# Patient Record
Sex: Female | Born: 1945
Health system: Southern US, Community
[De-identification: ages and names within clinical notes are randomized; demographics above are authoritative.]

## PROBLEM LIST (undated history)

## (undated) DIAGNOSIS — J449 Chronic obstructive pulmonary disease, unspecified: Secondary | ICD-10-CM

## (undated) DIAGNOSIS — I10 Essential (primary) hypertension: Secondary | ICD-10-CM

## (undated) DIAGNOSIS — E785 Hyperlipidemia, unspecified: Secondary | ICD-10-CM

## (undated) DIAGNOSIS — E079 Disorder of thyroid, unspecified: Secondary | ICD-10-CM

## (undated) DIAGNOSIS — L719 Rosacea, unspecified: Secondary | ICD-10-CM

## (undated) DIAGNOSIS — H3530 Unspecified macular degeneration: Secondary | ICD-10-CM

## (undated) DIAGNOSIS — K635 Polyp of colon: Secondary | ICD-10-CM

## (undated) DIAGNOSIS — S42301A Unspecified fracture of shaft of humerus, right arm, initial encounter for closed fracture: Secondary | ICD-10-CM

## (undated) DIAGNOSIS — G4730 Sleep apnea, unspecified: Secondary | ICD-10-CM

## (undated) DIAGNOSIS — K219 Gastro-esophageal reflux disease without esophagitis: Secondary | ICD-10-CM

## (undated) DIAGNOSIS — F329 Major depressive disorder, single episode, unspecified: Secondary | ICD-10-CM

## (undated) DIAGNOSIS — K589 Irritable bowel syndrome without diarrhea: Secondary | ICD-10-CM

## (undated) DIAGNOSIS — I639 Cerebral infarction, unspecified: Secondary | ICD-10-CM

## (undated) DIAGNOSIS — H269 Unspecified cataract: Secondary | ICD-10-CM

## (undated) DIAGNOSIS — K5790 Diverticulosis of intestine, part unspecified, without perforation or abscess without bleeding: Secondary | ICD-10-CM

## (undated) DIAGNOSIS — F32A Depression, unspecified: Secondary | ICD-10-CM

## (undated) DIAGNOSIS — F419 Anxiety disorder, unspecified: Secondary | ICD-10-CM

## (undated) DIAGNOSIS — N3281 Overactive bladder: Secondary | ICD-10-CM

## (undated) HISTORY — DX: Polyp of colon: K63.5

## (undated) HISTORY — PX: OOPHORECTOMY: SHX86

## (undated) HISTORY — DX: Unspecified fracture of shaft of humerus, right arm, initial encounter for closed fracture: S42.301A

## (undated) HISTORY — DX: Depression, unspecified: F32.A

## (undated) HISTORY — PX: PARTIAL HYSTERECTOMY: SHX80

## (undated) HISTORY — DX: Cerebral infarction, unspecified: I63.9

## (undated) HISTORY — DX: Hyperlipidemia, unspecified: E78.5

## (undated) HISTORY — DX: Disorder of thyroid, unspecified: E07.9

## (undated) HISTORY — PX: CERVICAL FUSION: SHX112

## (undated) HISTORY — DX: Diverticulosis of intestine, part unspecified, without perforation or abscess without bleeding: K57.90

## (undated) HISTORY — DX: Rosacea, unspecified: L71.9

## (undated) HISTORY — DX: Anxiety disorder, unspecified: F41.9

## (undated) HISTORY — PX: TONSILLECTOMY: SUR1361

## (undated) HISTORY — DX: Major depressive disorder, single episode, unspecified: F32.9

## (undated) HISTORY — DX: Sleep apnea, unspecified: G47.30

## (undated) HISTORY — PX: HERNIA REPAIR: SHX51

## (undated) HISTORY — DX: Gastro-esophageal reflux disease without esophagitis: K21.9

## (undated) HISTORY — DX: Chronic obstructive pulmonary disease, unspecified: J44.9

## (undated) HISTORY — DX: Overactive bladder: N32.81

## (undated) HISTORY — DX: Essential (primary) hypertension: I10

## (undated) HISTORY — DX: Irritable bowel syndrome, unspecified: K58.9

## (undated) HISTORY — PX: BLADDER SURGERY: SHX569

## (undated) HISTORY — DX: Unspecified cataract: H26.9

## (undated) HISTORY — PX: SHOULDER SURGERY: SHX246

## (undated) HISTORY — DX: Unspecified macular degeneration: H35.30

---
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
## 1993-07-31 HISTORY — PX: HYSTERECTOMY ABDOMINAL WITH SALPINGO-OOPHORECTOMY: SHX6792

## 1998-07-15 ENCOUNTER — Other Ambulatory Visit: Admission: RE | Admit: 1998-07-15 | Discharge: 1998-07-15 | Payer: Self-pay | Admitting: Gynecology

## 1998-07-31 HISTORY — PX: CERVICAL FUSION: SHX112

## 1999-09-13 ENCOUNTER — Other Ambulatory Visit: Admission: RE | Admit: 1999-09-13 | Discharge: 1999-09-13 | Payer: Self-pay | Admitting: Gynecology

## 2000-09-25 ENCOUNTER — Other Ambulatory Visit: Admission: RE | Admit: 2000-09-25 | Discharge: 2000-09-25 | Payer: Self-pay | Admitting: Gynecology

## 2001-11-13 ENCOUNTER — Other Ambulatory Visit: Admission: RE | Admit: 2001-11-13 | Discharge: 2001-11-13 | Payer: Self-pay | Admitting: Gynecology

## 2002-11-17 ENCOUNTER — Other Ambulatory Visit: Admission: RE | Admit: 2002-11-17 | Discharge: 2002-11-17 | Payer: Self-pay | Admitting: Gynecology

## 2003-08-07 ENCOUNTER — Ambulatory Visit (HOSPITAL_BASED_OUTPATIENT_CLINIC_OR_DEPARTMENT_OTHER): Admission: RE | Admit: 2003-08-07 | Discharge: 2003-08-07 | Payer: Self-pay | Admitting: Pulmonary Disease

## 2003-08-08 ENCOUNTER — Encounter: Payer: Self-pay | Admitting: Pulmonary Disease

## 2003-10-09 ENCOUNTER — Encounter: Payer: Self-pay | Admitting: Pulmonary Disease

## 2003-10-09 ENCOUNTER — Ambulatory Visit (HOSPITAL_BASED_OUTPATIENT_CLINIC_OR_DEPARTMENT_OTHER): Admission: RE | Admit: 2003-10-09 | Discharge: 2003-10-09 | Payer: Self-pay | Admitting: Pulmonary Disease

## 2003-11-23 ENCOUNTER — Other Ambulatory Visit: Admission: RE | Admit: 2003-11-23 | Discharge: 2003-11-23 | Payer: Self-pay | Admitting: Gynecology

## 2004-09-22 ENCOUNTER — Ambulatory Visit: Payer: Self-pay | Admitting: Internal Medicine

## 2004-12-01 ENCOUNTER — Other Ambulatory Visit: Admission: RE | Admit: 2004-12-01 | Discharge: 2004-12-01 | Payer: Self-pay | Admitting: Gynecology

## 2005-02-17 ENCOUNTER — Ambulatory Visit (HOSPITAL_COMMUNITY): Admission: AD | Admit: 2005-02-17 | Discharge: 2005-02-20 | Payer: Self-pay | Admitting: General Surgery

## 2005-03-28 ENCOUNTER — Ambulatory Visit: Payer: Self-pay | Admitting: Internal Medicine

## 2005-09-15 ENCOUNTER — Ambulatory Visit: Payer: Self-pay | Admitting: Internal Medicine

## 2005-11-15 ENCOUNTER — Ambulatory Visit: Payer: Self-pay | Admitting: Internal Medicine

## 2005-12-06 ENCOUNTER — Encounter: Admission: RE | Admit: 2005-12-06 | Discharge: 2005-12-06 | Payer: Self-pay | Admitting: Gynecology

## 2005-12-06 ENCOUNTER — Other Ambulatory Visit: Admission: RE | Admit: 2005-12-06 | Discharge: 2005-12-06 | Payer: Self-pay | Admitting: Gynecology

## 2005-12-06 ENCOUNTER — Ambulatory Visit: Payer: Self-pay | Admitting: Internal Medicine

## 2005-12-08 ENCOUNTER — Ambulatory Visit: Payer: Self-pay | Admitting: Internal Medicine

## 2005-12-11 ENCOUNTER — Ambulatory Visit: Payer: Self-pay | Admitting: Internal Medicine

## 2005-12-15 ENCOUNTER — Ambulatory Visit: Payer: Self-pay | Admitting: Internal Medicine

## 2005-12-15 ENCOUNTER — Encounter (INDEPENDENT_AMBULATORY_CARE_PROVIDER_SITE_OTHER): Payer: Self-pay | Admitting: Specialist

## 2005-12-27 ENCOUNTER — Ambulatory Visit: Payer: Self-pay | Admitting: Pulmonary Disease

## 2006-01-26 ENCOUNTER — Encounter: Admission: RE | Admit: 2006-01-26 | Discharge: 2006-01-26 | Payer: Self-pay | Admitting: Gynecology

## 2006-05-30 ENCOUNTER — Ambulatory Visit: Payer: Self-pay | Admitting: Internal Medicine

## 2006-06-05 ENCOUNTER — Ambulatory Visit: Payer: Self-pay | Admitting: Internal Medicine

## 2006-06-10 ENCOUNTER — Emergency Department (HOSPITAL_COMMUNITY): Admission: EM | Admit: 2006-06-10 | Discharge: 2006-06-11 | Payer: Self-pay | Admitting: Emergency Medicine

## 2006-10-16 ENCOUNTER — Ambulatory Visit: Payer: Self-pay | Admitting: Internal Medicine

## 2006-12-19 ENCOUNTER — Encounter: Payer: Self-pay | Admitting: Internal Medicine

## 2007-01-07 ENCOUNTER — Telehealth (INDEPENDENT_AMBULATORY_CARE_PROVIDER_SITE_OTHER): Payer: Self-pay | Admitting: *Deleted

## 2007-01-08 ENCOUNTER — Ambulatory Visit: Payer: Self-pay | Admitting: Internal Medicine

## 2007-01-08 DIAGNOSIS — F32A Depression, unspecified: Secondary | ICD-10-CM | POA: Insufficient documentation

## 2007-01-08 DIAGNOSIS — J45909 Unspecified asthma, uncomplicated: Secondary | ICD-10-CM | POA: Insufficient documentation

## 2007-01-08 DIAGNOSIS — I10 Essential (primary) hypertension: Secondary | ICD-10-CM | POA: Insufficient documentation

## 2007-01-08 DIAGNOSIS — G4730 Sleep apnea, unspecified: Secondary | ICD-10-CM | POA: Insufficient documentation

## 2007-01-08 DIAGNOSIS — Z8719 Personal history of other diseases of the digestive system: Secondary | ICD-10-CM | POA: Insufficient documentation

## 2007-01-08 DIAGNOSIS — L719 Rosacea, unspecified: Secondary | ICD-10-CM | POA: Insufficient documentation

## 2007-01-08 DIAGNOSIS — F329 Major depressive disorder, single episode, unspecified: Secondary | ICD-10-CM

## 2007-01-08 DIAGNOSIS — E785 Hyperlipidemia, unspecified: Secondary | ICD-10-CM | POA: Insufficient documentation

## 2007-01-14 ENCOUNTER — Ambulatory Visit: Payer: Self-pay | Admitting: Internal Medicine

## 2007-01-14 ENCOUNTER — Telehealth: Payer: Self-pay | Admitting: Internal Medicine

## 2007-01-21 ENCOUNTER — Other Ambulatory Visit: Admission: RE | Admit: 2007-01-21 | Discharge: 2007-01-21 | Payer: Self-pay | Admitting: Gynecology

## 2007-01-22 ENCOUNTER — Ambulatory Visit: Payer: Self-pay | Admitting: Pulmonary Disease

## 2007-01-22 LAB — CONVERTED CEMR LAB
BUN: 10 mg/dL (ref 6–23)
Basophils Absolute: 0.1 10*3/uL (ref 0.0–0.1)
Basophils Relative: 2.1 % — ABNORMAL HIGH (ref 0.0–1.0)
Bilirubin, Direct: 0.1 mg/dL (ref 0.0–0.3)
Calcium: 9.5 mg/dL (ref 8.4–10.5)
Chloride: 108 meq/L (ref 96–112)
Direct LDL: 153.8 mg/dL
Eosinophils Absolute: 0.2 10*3/uL (ref 0.0–0.6)
Eosinophils Relative: 2.8 % (ref 0.0–5.0)
GFR calc non Af Amer: 108 mL/min
HCT: 44.3 % (ref 36.0–46.0)
HDL: 49.1 mg/dL (ref >39.0)
Lymphocytes Relative: 20.8 % (ref 12.0–46.0)
Monocytes Absolute: 0.3 10*3/uL (ref 0.2–0.7)
RBC: 4.7 M/uL (ref 3.87–5.11)
Total Bilirubin: 0.4 mg/dL (ref 0.3–1.2)
Total CHOL/HDL Ratio: 7.1
Total Protein: 6.2 g/dL (ref 6.0–8.3)
VLDL: 107 mg/dL — ABNORMAL HIGH (ref 0–40)

## 2007-01-23 ENCOUNTER — Ambulatory Visit (HOSPITAL_BASED_OUTPATIENT_CLINIC_OR_DEPARTMENT_OTHER): Admission: RE | Admit: 2007-01-23 | Discharge: 2007-01-23 | Payer: Self-pay | Admitting: Pulmonary Disease

## 2007-01-23 ENCOUNTER — Encounter: Payer: Self-pay | Admitting: Pulmonary Disease

## 2007-01-28 ENCOUNTER — Ambulatory Visit: Payer: Self-pay | Admitting: Pulmonary Disease

## 2007-02-28 ENCOUNTER — Ambulatory Visit: Payer: Self-pay | Admitting: Pulmonary Disease

## 2007-05-14 ENCOUNTER — Telehealth (INDEPENDENT_AMBULATORY_CARE_PROVIDER_SITE_OTHER): Payer: Self-pay | Admitting: *Deleted

## 2007-05-21 ENCOUNTER — Ambulatory Visit: Payer: Self-pay | Admitting: Internal Medicine

## 2007-05-21 DIAGNOSIS — F17200 Nicotine dependence, unspecified, uncomplicated: Secondary | ICD-10-CM | POA: Insufficient documentation

## 2007-05-28 ENCOUNTER — Telehealth (INDEPENDENT_AMBULATORY_CARE_PROVIDER_SITE_OTHER): Payer: Self-pay | Admitting: *Deleted

## 2007-09-27 ENCOUNTER — Ambulatory Visit: Payer: Self-pay | Admitting: Internal Medicine

## 2007-09-27 DIAGNOSIS — N318 Other neuromuscular dysfunction of bladder: Secondary | ICD-10-CM | POA: Insufficient documentation

## 2007-09-27 DIAGNOSIS — G8929 Other chronic pain: Secondary | ICD-10-CM | POA: Insufficient documentation

## 2007-09-27 DIAGNOSIS — M545 Low back pain, unspecified: Secondary | ICD-10-CM | POA: Insufficient documentation

## 2008-03-17 ENCOUNTER — Encounter: Payer: Self-pay | Admitting: Pulmonary Disease

## 2008-03-20 ENCOUNTER — Ambulatory Visit: Payer: Self-pay | Admitting: Internal Medicine

## 2008-03-20 ENCOUNTER — Ambulatory Visit: Payer: Self-pay | Admitting: Pulmonary Disease

## 2008-03-20 DIAGNOSIS — Z860100 Personal history of colon polyps, unspecified: Secondary | ICD-10-CM | POA: Insufficient documentation

## 2008-03-20 DIAGNOSIS — K589 Irritable bowel syndrome without diarrhea: Secondary | ICD-10-CM | POA: Insufficient documentation

## 2008-03-20 DIAGNOSIS — R197 Diarrhea, unspecified: Secondary | ICD-10-CM | POA: Insufficient documentation

## 2008-03-20 LAB — CONVERTED CEMR LAB
AST: 18 units/L (ref 0–37)
Alkaline Phosphatase: 80 units/L (ref 39–117)
BUN: 15 mg/dL (ref 6–23)
Creatinine, Ser: 0.7 mg/dL (ref 0.4–1.2)
Eosinophils Absolute: 0.1 10*3/uL (ref 0.0–0.7)
GFR calc Af Amer: 109 mL/min
Glucose, Bld: 98 mg/dL (ref 70–99)
Hemoglobin: 15.4 g/dL — ABNORMAL HIGH (ref 12.0–15.0)
Monocytes Absolute: 0.5 10*3/uL (ref 0.1–1.0)
Neutrophils Relative %: 66.3 % (ref 43.0–77.0)
Platelets: 212 10*3/uL (ref 150–400)
RBC: 4.68 M/uL (ref 3.87–5.11)
Total Bilirubin: 0.5 mg/dL (ref 0.3–1.2)
WBC: 6.4 10*3/uL (ref 4.5–10.5)

## 2008-03-21 ENCOUNTER — Encounter: Payer: Self-pay | Admitting: Internal Medicine

## 2008-04-16 ENCOUNTER — Ambulatory Visit: Payer: Self-pay | Admitting: Internal Medicine

## 2008-04-20 ENCOUNTER — Telehealth (INDEPENDENT_AMBULATORY_CARE_PROVIDER_SITE_OTHER): Payer: Self-pay | Admitting: *Deleted

## 2008-06-18 ENCOUNTER — Ambulatory Visit: Payer: Self-pay | Admitting: Internal Medicine

## 2008-06-18 DIAGNOSIS — R079 Chest pain, unspecified: Secondary | ICD-10-CM | POA: Insufficient documentation

## 2008-07-03 ENCOUNTER — Telehealth (INDEPENDENT_AMBULATORY_CARE_PROVIDER_SITE_OTHER): Payer: Self-pay | Admitting: *Deleted

## 2008-07-13 ENCOUNTER — Telehealth (INDEPENDENT_AMBULATORY_CARE_PROVIDER_SITE_OTHER): Payer: Self-pay | Admitting: *Deleted

## 2008-07-20 ENCOUNTER — Ambulatory Visit: Payer: Self-pay | Admitting: Family Medicine

## 2008-07-20 DIAGNOSIS — H6690 Otitis media, unspecified, unspecified ear: Secondary | ICD-10-CM | POA: Insufficient documentation

## 2008-07-22 ENCOUNTER — Telehealth (INDEPENDENT_AMBULATORY_CARE_PROVIDER_SITE_OTHER): Payer: Self-pay | Admitting: *Deleted

## 2008-07-23 ENCOUNTER — Telehealth (INDEPENDENT_AMBULATORY_CARE_PROVIDER_SITE_OTHER): Payer: Self-pay | Admitting: *Deleted

## 2008-07-31 HISTORY — PX: HERNIA REPAIR: SHX51

## 2009-02-12 ENCOUNTER — Telehealth (INDEPENDENT_AMBULATORY_CARE_PROVIDER_SITE_OTHER): Payer: Self-pay | Admitting: *Deleted

## 2009-02-17 ENCOUNTER — Telehealth (INDEPENDENT_AMBULATORY_CARE_PROVIDER_SITE_OTHER): Payer: Self-pay | Admitting: *Deleted

## 2009-02-24 ENCOUNTER — Telehealth (INDEPENDENT_AMBULATORY_CARE_PROVIDER_SITE_OTHER): Payer: Self-pay | Admitting: *Deleted

## 2009-03-19 ENCOUNTER — Ambulatory Visit: Payer: Self-pay | Admitting: Family Medicine

## 2009-03-19 DIAGNOSIS — K219 Gastro-esophageal reflux disease without esophagitis: Secondary | ICD-10-CM | POA: Insufficient documentation

## 2009-04-02 ENCOUNTER — Ambulatory Visit: Payer: Self-pay | Admitting: Family Medicine

## 2009-04-02 DIAGNOSIS — R5383 Other fatigue: Secondary | ICD-10-CM | POA: Insufficient documentation

## 2009-04-06 LAB — CONVERTED CEMR LAB
Albumin: 4.1 g/dL (ref 3.5–5.2)
Alkaline Phosphatase: 58 units/L (ref 39–117)
BUN: 17 mg/dL (ref 6–23)
Basophils Absolute: 0 10*3/uL (ref 0.0–0.1)
Bilirubin, Direct: 0 mg/dL (ref 0.0–0.3)
CO2: 31 meq/L (ref 19–32)
Creatinine, Ser: 0.7 mg/dL (ref 0.4–1.2)
Direct LDL: 126.1 mg/dL
Eosinophils Absolute: 0.2 10*3/uL (ref 0.0–0.7)
Folate: 9.2 ng/mL
Glucose, Bld: 84 mg/dL (ref 70–99)
Hemoglobin: 16.1 g/dL — ABNORMAL HIGH (ref 12.0–15.0)
Lymphocytes Relative: 20.2 % (ref 12.0–46.0)
MCHC: 34.7 g/dL (ref 30.0–36.0)
MCV: 97 fL (ref 78.0–100.0)
Monocytes Absolute: 0.6 10*3/uL (ref 0.1–1.0)
Neutro Abs: 5.3 10*3/uL (ref 1.4–7.7)
Neutrophils Relative %: 70.2 % (ref 43.0–77.0)
Potassium: 4.3 meq/L (ref 3.5–5.1)
RBC: 4.78 M/uL (ref 3.87–5.11)
TSH: 1.31 microintl units/mL (ref 0.35–5.50)
Total CHOL/HDL Ratio: 5
VLDL: 80.2 mg/dL — ABNORMAL HIGH (ref 0.0–40.0)
Vit D, 25-Hydroxy: 17 ng/mL — ABNORMAL LOW (ref 30–89)

## 2009-04-08 ENCOUNTER — Telehealth (INDEPENDENT_AMBULATORY_CARE_PROVIDER_SITE_OTHER): Payer: Self-pay | Admitting: *Deleted

## 2009-04-08 ENCOUNTER — Ambulatory Visit: Payer: Self-pay | Admitting: Family Medicine

## 2009-04-08 ENCOUNTER — Ambulatory Visit (HOSPITAL_BASED_OUTPATIENT_CLINIC_OR_DEPARTMENT_OTHER): Admission: RE | Admit: 2009-04-08 | Discharge: 2009-04-08 | Payer: Self-pay | Admitting: Critical Care Medicine

## 2009-04-08 ENCOUNTER — Ambulatory Visit: Payer: Self-pay | Admitting: Critical Care Medicine

## 2009-04-08 ENCOUNTER — Ambulatory Visit: Payer: Self-pay | Admitting: Diagnostic Radiology

## 2009-04-08 DIAGNOSIS — E538 Deficiency of other specified B group vitamins: Secondary | ICD-10-CM | POA: Insufficient documentation

## 2009-04-08 LAB — CONVERTED CEMR LAB: IgE (Immunoglobulin E), Serum: 13.4 intl units/mL (ref 0.0–180.0)

## 2009-04-15 ENCOUNTER — Ambulatory Visit: Payer: Self-pay | Admitting: Family Medicine

## 2009-04-21 ENCOUNTER — Encounter: Payer: Self-pay | Admitting: Cardiology

## 2009-04-21 ENCOUNTER — Ambulatory Visit: Payer: Self-pay | Admitting: Cardiology

## 2009-04-21 DIAGNOSIS — R0989 Other specified symptoms and signs involving the circulatory and respiratory systems: Secondary | ICD-10-CM | POA: Insufficient documentation

## 2009-04-22 ENCOUNTER — Ambulatory Visit: Payer: Self-pay | Admitting: Family Medicine

## 2009-04-23 ENCOUNTER — Encounter (INDEPENDENT_AMBULATORY_CARE_PROVIDER_SITE_OTHER): Payer: Self-pay | Admitting: *Deleted

## 2009-05-06 ENCOUNTER — Ambulatory Visit: Payer: Self-pay | Admitting: Critical Care Medicine

## 2009-05-19 ENCOUNTER — Ambulatory Visit: Payer: Self-pay | Admitting: Critical Care Medicine

## 2009-05-19 ENCOUNTER — Telehealth (INDEPENDENT_AMBULATORY_CARE_PROVIDER_SITE_OTHER): Payer: Self-pay | Admitting: *Deleted

## 2009-05-20 ENCOUNTER — Ambulatory Visit: Payer: Self-pay

## 2009-05-20 ENCOUNTER — Encounter: Payer: Self-pay | Admitting: Cardiology

## 2009-05-20 ENCOUNTER — Encounter: Payer: Self-pay | Admitting: Critical Care Medicine

## 2009-05-20 ENCOUNTER — Encounter (HOSPITAL_COMMUNITY): Admission: RE | Admit: 2009-05-20 | Discharge: 2009-07-28 | Payer: Self-pay | Admitting: Cardiology

## 2009-05-20 ENCOUNTER — Ambulatory Visit: Payer: Self-pay | Admitting: Cardiology

## 2009-05-27 ENCOUNTER — Telehealth: Payer: Self-pay | Admitting: Family Medicine

## 2009-05-27 ENCOUNTER — Ambulatory Visit: Payer: Self-pay | Admitting: Family Medicine

## 2009-06-02 ENCOUNTER — Encounter: Payer: Self-pay | Admitting: Cardiology

## 2009-06-02 ENCOUNTER — Ambulatory Visit: Payer: Self-pay | Admitting: Cardiology

## 2009-06-02 DIAGNOSIS — I6529 Occlusion and stenosis of unspecified carotid artery: Secondary | ICD-10-CM | POA: Insufficient documentation

## 2009-06-03 ENCOUNTER — Ambulatory Visit: Payer: Self-pay | Admitting: Critical Care Medicine

## 2009-06-03 DIAGNOSIS — J383 Other diseases of vocal cords: Secondary | ICD-10-CM | POA: Insufficient documentation

## 2009-06-04 ENCOUNTER — Telehealth (INDEPENDENT_AMBULATORY_CARE_PROVIDER_SITE_OTHER): Payer: Self-pay | Admitting: *Deleted

## 2009-06-07 ENCOUNTER — Telehealth: Payer: Self-pay | Admitting: Cardiology

## 2009-06-07 ENCOUNTER — Telehealth (INDEPENDENT_AMBULATORY_CARE_PROVIDER_SITE_OTHER): Payer: Self-pay | Admitting: *Deleted

## 2009-06-16 ENCOUNTER — Telehealth (INDEPENDENT_AMBULATORY_CARE_PROVIDER_SITE_OTHER): Payer: Self-pay | Admitting: *Deleted

## 2009-06-16 ENCOUNTER — Ambulatory Visit: Payer: Self-pay | Admitting: Internal Medicine

## 2009-06-17 LAB — CONVERTED CEMR LAB
LDL Cholesterol: 101 mg/dL — ABNORMAL HIGH (ref 0–99)
Total CHOL/HDL Ratio: 3
Vitamin B-12: >1500 pg/mL — ABNORMAL HIGH (ref 211–911)

## 2009-06-29 ENCOUNTER — Telehealth (INDEPENDENT_AMBULATORY_CARE_PROVIDER_SITE_OTHER): Payer: Self-pay | Admitting: *Deleted

## 2009-08-10 ENCOUNTER — Telehealth (INDEPENDENT_AMBULATORY_CARE_PROVIDER_SITE_OTHER): Payer: Self-pay | Admitting: *Deleted

## 2009-09-01 ENCOUNTER — Telehealth: Payer: Self-pay | Admitting: Internal Medicine

## 2009-10-01 ENCOUNTER — Telehealth (INDEPENDENT_AMBULATORY_CARE_PROVIDER_SITE_OTHER): Payer: Self-pay | Admitting: *Deleted

## 2009-10-21 ENCOUNTER — Telehealth (INDEPENDENT_AMBULATORY_CARE_PROVIDER_SITE_OTHER): Payer: Self-pay | Admitting: *Deleted

## 2009-11-12 ENCOUNTER — Telehealth (INDEPENDENT_AMBULATORY_CARE_PROVIDER_SITE_OTHER): Payer: Self-pay | Admitting: *Deleted

## 2009-11-12 ENCOUNTER — Encounter (INDEPENDENT_AMBULATORY_CARE_PROVIDER_SITE_OTHER): Payer: Self-pay | Admitting: *Deleted

## 2009-11-22 ENCOUNTER — Telehealth (INDEPENDENT_AMBULATORY_CARE_PROVIDER_SITE_OTHER): Payer: Self-pay | Admitting: *Deleted

## 2009-11-23 ENCOUNTER — Telehealth (INDEPENDENT_AMBULATORY_CARE_PROVIDER_SITE_OTHER): Payer: Self-pay | Admitting: *Deleted

## 2009-12-07 ENCOUNTER — Telehealth (INDEPENDENT_AMBULATORY_CARE_PROVIDER_SITE_OTHER): Payer: Self-pay | Admitting: *Deleted

## 2009-12-16 ENCOUNTER — Ambulatory Visit: Payer: Self-pay | Admitting: Family Medicine

## 2009-12-16 DIAGNOSIS — R413 Other amnesia: Secondary | ICD-10-CM | POA: Insufficient documentation

## 2009-12-16 LAB — CONVERTED CEMR LAB
ALT: 21 units/L (ref 0–35)
Albumin: 4.4 g/dL (ref 3.5–5.2)
Basophils Relative: 0.6 % (ref 0.0–3.0)
Bilirubin, Direct: 0.1 mg/dL (ref 0.0–0.3)
CO2: 28 meq/L (ref 19–32)
Chloride: 105 meq/L (ref 96–112)
Creatinine, Ser: 0.8 mg/dL (ref 0.4–1.2)
LDL Cholesterol: 83 mg/dL (ref 0–99)
Lymphocytes Relative: 25.9 % (ref 12.0–46.0)
Lymphs Abs: 1.5 10*3/uL (ref 0.7–4.0)
MCV: 97.9 fL (ref 78.0–100.0)
Monocytes Absolute: 0.5 10*3/uL (ref 0.1–1.0)
Monocytes Relative: 8.3 % (ref 3.0–12.0)
Neutro Abs: 3.6 10*3/uL (ref 1.4–7.7)
Neutrophils Relative %: 63 % (ref 43.0–77.0)
Platelets: 201 10*3/uL (ref 150.0–400.0)
RBC: 4.77 M/uL (ref 3.87–5.11)
RDW: 12.5 % (ref 11.5–14.6)
Sodium: 141 meq/L (ref 135–145)
Total Bilirubin: 0.5 mg/dL (ref 0.3–1.2)
Total CHOL/HDL Ratio: 3
Total Protein: 6.7 g/dL (ref 6.0–8.3)
Vitamin B-12: 235 pg/mL (ref 211–911)
WBC: 5.8 10*3/uL (ref 4.5–10.5)

## 2009-12-17 ENCOUNTER — Telehealth (INDEPENDENT_AMBULATORY_CARE_PROVIDER_SITE_OTHER): Payer: Self-pay | Admitting: *Deleted

## 2009-12-17 LAB — CONVERTED CEMR LAB
Free T4: 0.8 ng/dL (ref 0.6–1.6)
Vit D, 25-Hydroxy: 24 ng/mL — ABNORMAL LOW (ref 30–89)

## 2009-12-21 ENCOUNTER — Telehealth (INDEPENDENT_AMBULATORY_CARE_PROVIDER_SITE_OTHER): Payer: Self-pay | Admitting: *Deleted

## 2010-01-27 ENCOUNTER — Telehealth (INDEPENDENT_AMBULATORY_CARE_PROVIDER_SITE_OTHER): Payer: Self-pay | Admitting: *Deleted

## 2010-03-18 ENCOUNTER — Telehealth (INDEPENDENT_AMBULATORY_CARE_PROVIDER_SITE_OTHER): Payer: Self-pay | Admitting: *Deleted

## 2010-03-22 ENCOUNTER — Ambulatory Visit: Payer: Self-pay | Admitting: Family Medicine

## 2010-03-22 DIAGNOSIS — E559 Vitamin D deficiency, unspecified: Secondary | ICD-10-CM | POA: Insufficient documentation

## 2010-03-22 LAB — CONVERTED CEMR LAB
Basophils Absolute: 0 10*3/uL (ref 0.0–0.1)
Eosinophils Relative: 2 % (ref 0.0–5.0)
HCT: 45.1 % (ref 36.0–46.0)
Hemoglobin: 15.6 g/dL — ABNORMAL HIGH (ref 12.0–15.0)
Lymphocytes Relative: 26.1 % (ref 12.0–46.0)
MCHC: 34.7 g/dL (ref 30.0–36.0)
MCV: 95.8 fL (ref 78.0–100.0)
Monocytes Relative: 7.6 % (ref 3.0–12.0)
Neutro Abs: 3.8 10*3/uL (ref 1.4–7.7)
Vitamin B-12: 267 pg/mL (ref 211–911)

## 2010-04-22 ENCOUNTER — Telehealth (INDEPENDENT_AMBULATORY_CARE_PROVIDER_SITE_OTHER): Payer: Self-pay | Admitting: *Deleted

## 2010-05-30 ENCOUNTER — Telehealth (INDEPENDENT_AMBULATORY_CARE_PROVIDER_SITE_OTHER): Payer: Self-pay | Admitting: *Deleted

## 2010-08-05 ENCOUNTER — Telehealth (INDEPENDENT_AMBULATORY_CARE_PROVIDER_SITE_OTHER): Payer: Self-pay | Admitting: *Deleted

## 2010-08-21 ENCOUNTER — Encounter: Payer: Self-pay | Admitting: Gynecology

## 2010-08-25 ENCOUNTER — Encounter (INDEPENDENT_AMBULATORY_CARE_PROVIDER_SITE_OTHER): Payer: Self-pay | Admitting: *Deleted

## 2010-09-01 NOTE — Progress Notes (Signed)
Summary: refill  Phone Note Refill Request Message from:  Fax from Pharmacy on November 12, 2009 2:29 PM  Refills Requested: Medication #1:  LORTAB 5 5-500 MG TABS 1 by mouth q4hours as needed-NEEDS TO SCHEDULE OFFICE VISIT BEFORE NEXT REFILL [facility] [address] fax [phone]   Method Requested: Fax to Local Pharmacy Next Appointment Scheduled: no appt Initial call taken by: Barb Merino,  November 12, 2009 2:30 PM  Follow-up for Phone Call        needs to schedule office visit as directed before last refill .........Marland KitchenDoristine Devoid  November 12, 2009 4:34 PM

## 2010-09-01 NOTE — Progress Notes (Signed)
Summary: LORTAB REFILL   Phone Note Refill Request Message from:  Fax from Pharmacy on October 01, 2009 3:40 PM  Refills Requested: Medication #1:  LORTAB 5 5-500 MG TABS 1 by mouth [facility]4hours [facility] [phone]   Method Requested: Fax to Local Pharmacy Next Appointment Scheduled: no appt Initial call taken by: Barb Merino,  October 01, 2009 3:40 PM    New/Updated Medications: LORTAB 5 5-500 MG TABS (HYDROCODONE-ACETAMINOPHEN) 1 by mouth q4hours as needed-NEEDS TO SCHEDULE OFFICE VISIT BEFORE NEXT REFILL Prescriptions: LORTAB 5 5-500 MG TABS (HYDROCODONE-ACETAMINOPHEN) 1 by mouth q4hours as needed-NEEDS TO SCHEDULE OFFICE VISIT BEFORE NEXT REFILL  #50 x 0   Entered by:   Doristine Devoid   Authorized by:   Neena Rhymes MD   Signed by:   Doristine Devoid on 10/04/2009   Method used:   Printed then faxed to ...       [facility] [address] [phone]* (retail)       [address]       [place], [place]  [address]       Ph: [phone]       Fax: [phone]   RxID:   [id number]

## 2010-09-01 NOTE — Assessment & Plan Note (Signed)
Summary: FATIGUE,MEDICARE/RH......   Vital Signs:  Patient profile:   65 year old female Weight:      209 pounds Pulse rate:   64 / minute BP sitting:   120 / 80  (left arm)  Vitals Entered By: Doristine Devoid CMA (March 22, 2010 9:30 AM) CC: fatigue and trouble sleeping    History of Present Illness: 65 yo woman here today for fatigue and insomnia.  husband had an MI 2.5 weeks ago.  attempting to quit smoking given recent events.  very stressed about near loss of husband and now lifestyle overhaul.  has sleep apnea- using machine nightly.  sxs of fatigue predate recent stressors.  due to recheck Vit D/B12.  waking up at 7, feels need to lie down at 11am and again at 3pm.  denies sxs of depression- has been depressed before and is aware of sxs.  not crying, withdrawing from family/friends, having overwhelming sadness.  denies abd pain, change in bowels, CP, HA.  Preventive Screening-Counseling & Management  Alcohol-Tobacco     Smoking Status: current     Smoking Cessation Counseling: yes     Smoke Cessation Stage: ready      Sexual History:  currently monogamous.    Current Medications (verified): 1)  Klonopin 0.5 Mg Tabs (Clonazepam) .... Take 1 1/2 Tablet By Mouth At Bedtime, 2)  Lortab 5 5-500 Mg Tabs (Hydrocodone-Acetaminophen) .Marland Kitchen.. 1 By Mouth Q4hours As Needed- 3)  Oxybutynin Chloride 5 Mg Tabs (Oxybutynin Chloride) .... 1/2 Tab Two Times A Day 4)  Pravachol 40 Mg Tabs (Pravastatin Sodium) .Marland Kitchen.. 1 A Day 5)  Albuterol Sulfate (5 Mg/ml) 0.5% Nebu (Albuterol Sulfate) .Marland Kitchen.. 1 Every 4-6 Hours As Needed.  Disp 1 Box. 6)  Omeprazole 40 Mg Cpdr (Omeprazole) .Marland Kitchen.. 1 Tab By Mouth Daily 7)  Vitamin B Injection .... Once A Month 8)  Tessalon Perles 100 Mg  Caps (Benzonatate) .... Two Every 4-6 Hours 9)  Celexa 20 Mg Tabs (Citalopram Hydrobromide) .Marland Kitchen.. 1 By Mouth Once Daily. 10)  Spiriva Handihaler 18 Mcg  Caps (Tiotropium Bromide Monohydrate) .... Contents of One Capsule Inhaled Daily 11)   Ergocalciferol 50000 Unit Caps (Ergocalciferol) .Marland Kitchen.. 1 By Mouth Weekly For 12 Weeks Then Recheck Lab  Allergies (verified): 1)  ! Bactrim 2)  ! Tramadol Hcl  Past History:  Past Medical History: Last updated: 12/16/2009 Current Problems:  HYPERTENSION (ICD-401.9) HYPERLIPIDEMIA (ICD-272.4) GERD (ICD-530.81) UNSPECIFIED OTITIS MEDIA (ICD-382.9) PERSONAL HX COLONIC POLYPS (ICD-V12.72) IRRITABLE BOWEL SYNDROME (ICD-564.1) DIARRHEA (ICD-787.91) OVERACTIVE BLADDER (ICD-596.51) DEPRESSION (ICD-311) COPD (ICD-496) ASTHMA (ICD-493.90) SLEEP APNEA (ICD-780.57) TOBACCO ABUSE (ICD-305.1) ROSACEA (ICD-695.3) OAB Diverticulosis Hemorrhoids Macular degeneration- Dr Jackquline Bosch  Social History: Last updated: 04/08/2009 Married two children husband [name] on disability Occupation: housekeeper, 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  now on disability Patient currently smokes 3 cig/day.  1ppd x35yrs.  Alcohol Use - no  Review of Systems      See HPI  Physical Exam  General:  alert, well-developed, and overweight-appearing.   Head:  Normocephalic and atraumatic.   Neck:  Supple; no masses or thyromegaly. Lungs:  decreased breath sounds throughout w/ expiratory wheezes Heart:  Regular rate and rhythm; no murmurs, rubs,  or bruits. Pulses:  +2 carotid, radial, DP Extremities:  no C/C/E Cervical Nodes:  No lymphadenopathy noted Psych:  Cognition and judgment appear intact. Alert and cooperative with normal attention span and concentration. No apparent delusions, illusions, hallucinations   Impression & Recommendations:  Problem # 1:  FATIGUE (ICD-780.79) Assessment Deteriorated check  labs.  pt's sxs likely multifactorial- poor oxygenation due to COPD, recent stress, overweight, possible lab abnormality (Vit D/B12).  check labs.  encouraged her to have her CPAP machine serviced as she has concerns about this.  will follow closely. Orders: Venipuncture ([address]) Specimen Handling ([address]) TLB-CBC  Platelet - w/Differential (85025-CBCD) TLB-TSH (Thyroid Stimulating Hormone) (84443-TSH)  Problem # 2:  B12 DEFICIENCY (ICD-266.2) Assessment: Unchanged due for labs Orders: Specimen Handling ([address]) TLB-B12, Serum-Total ONLY (91478-G95)  Problem # 3:  VITAMIN D DEFICIENCY (ICD-268.9) Assessment: New just completed course of replacement tx.  due for lab recheck Orders: T-Vitamin D (25-Hydroxy) (62130-86578)  Problem # 4:  DEPRESSION (ICD-311) Assessment: Unchanged pt reports this is controlled on current meds.  will follow. Her updated medication list for this problem includes:    Klonopin 0.5 Mg Tabs (Clonazepam) .Marland Kitchen... Take 1 1/2 tablet by mouth at bedtime,    Celexa 20 Mg Tabs (Citalopram hydrobromide) .Marland Kitchen... 1 by mouth once daily.  Complete Medication List: 1)  Klonopin 0.5 Mg Tabs (Clonazepam) .... Take 1 1/2 tablet by mouth at bedtime, 2)  Lortab 5 5-500 Mg Tabs (Hydrocodone-acetaminophen) .Marland Kitchen.. 1 by mouth q4hours as needed- 3)  Oxybutynin Chloride 5 Mg Tabs (Oxybutynin chloride) .... 1/2 tab two times a day 4)  Pravachol 40 Mg Tabs (Pravastatin sodium) .Marland Kitchen.. 1 a day 5)  Albuterol Sulfate (5 Mg/ml) 0.5% Nebu (Albuterol sulfate) .Marland Kitchen.. 1 every 4-6 hours as needed.  disp 1 box. 6)  Omeprazole 40 Mg Cpdr (Omeprazole) .Marland Kitchen.. 1 tab by mouth daily 7)  Vitamin B Injection  .... Once a month 8)  Tessalon Perles 100 Mg Caps (Benzonatate) .... Two every 4-6 hours 9)  Celexa 20 Mg Tabs (Citalopram hydrobromide) .Marland Kitchen.. 1 by mouth once daily. 10)  Spiriva Handihaler 18 Mcg Caps (Tiotropium bromide monohydrate) .... Contents of one capsule inhaled daily 11)  Ergocalciferol 50000 Unit Caps (Ergocalciferol) .Marland Kitchen.. 1 by mouth weekly for 12 weeks then recheck lab  Patient Instructions: 1)  Follow up in 2-3 weeks to recheck your symptoms if no better 2)  We'll notify you of your lab results 3)  Call and have them check your CPAP machine 4)  Call when you need refills/samples- we'll give you whatever  we have 5)  Hang in there!!

## 2010-09-01 NOTE — Progress Notes (Signed)
  Phone Note Refill Request Message from:  Fax from Pharmacy on September 01, 2009 2:57 PM  Refills Requested: Medication #1:  KLONOPIN 0.5 MG TABS Take 1 1/2 tablet by mouth at bedtime   Notes: was rx'd #180 with 1 refill on 01/04/09  [facility] [address] Initial call taken by: Shary Decamp,  September 01, 2009 2:57 PM  Follow-up for Phone Call        Patient got #180 last time which was a 4 month supply.  So she is due for refill this month.  I refilled #135 which is a 3 month supply until Dr. Beverely Low returns to office.  Note on rx that pt needs to schedule ov with Dr. Beverely Low before next refill.  Last ov with Tabori 03/2009 Shary Decamp  September 01, 2009 4:22 PM Anthony E. Shevette Bess MD  September 01, 2009 5:05 PM     New/Updated Medications: KLONOPIN 0.5 MG TABS (CLONAZEPAM) Take 1 1/2 tablet by mouth at bedtime, NEEDS OFFICE VISIT BEFORE NEXT REFILL Prescriptions: KLONOPIN 0.5 MG TABS (CLONAZEPAM) Take 1 1/2 tablet by mouth at bedtime, NEEDS OFFICE VISIT BEFORE NEXT REFILL  #135 x 0   Entered by:   Shary Decamp   Authorized by:   Nolon Rod. Dyneisha Murchison MD   Signed by:   Shary Decamp on 09/01/2009   Method used:   Printed then faxed to ...       [facility] [address] [phone]* (retail)       [address]       [place], [place]  [address]       Ph: [phone]       Fax: [phone]   RxID:   [id number]

## 2010-09-01 NOTE — Progress Notes (Signed)
Summary: oxyburtin and lortab refill   Phone Note Refill Request   Refills Requested: Medication #1:  LORTAB 5 5-500 MG TABS 1 by mouth q4hours as needed-  Medication #2:  OXYBUTYNIN CHLORIDE 5 MG TABS 1/2 tab two times a day Initial call taken by: Doristine Devoid CMA,  March 18, 2010 11:16 AM    Prescriptions: OXYBUTYNIN CHLORIDE 5 MG TABS (OXYBUTYNIN CHLORIDE) 1/2 tab two times a day  #30 x 3   Entered by:   Doristine Devoid CMA   Authorized by:   Neena Rhymes MD   Signed by:   Doristine Devoid CMA on 03/18/2010   Method used:   Printed then faxed to ...       [facility] [address] [phone]* (retail)       [address]       [place], [place]  [address]       Ph: [phone]       Fax: [phone]   RxID:   [id number] LORTAB 5 5-500 MG TABS (HYDROCODONE-ACETAMINOPHEN) 1 by mouth q4hours as needed-  #30 x 0   Entered by:   Doristine Devoid CMA   Authorized by:   Neena Rhymes MD   Signed by:   Doristine Devoid CMA on 03/18/2010   Method used:   Printed then faxed to ...       [facility] [address] [phone]* (retail)       [address]       [place], [place]  [address]       Ph: [phone]       Fax: [phone]   RxID:   [id number]

## 2010-09-01 NOTE — Progress Notes (Signed)
Summary: lortab refill   Phone Note Refill Request Call back at [phone] Message from:  Pharmacy on January 27, 2010 10:57 AM  Refills Requested: Medication #1:  LORTAB 5 5-500 MG TABS 1 by mouth q4hours as needed-   Dosage confirmed as above?Dosage Confirmed   Supply Requested: 1 month   Last Refilled: 12/24/2009 [facility] [address]  Next Appointment Scheduled: NONE Initial call taken by: Lavell Islam,  January 27, 2010 10:58 AM    Prescriptions: LORTAB 5 5-500 MG TABS (HYDROCODONE-ACETAMINOPHEN) 1 by mouth q4hours as needed-  #30 x 0   Entered by:   Doristine Devoid   Authorized by:   Neena Rhymes MD   Signed by:   Doristine Devoid on 01/27/2010   Method used:   Printed then faxed to ...       [facility] [address] [phone]* (retail)       [address]       [place], [place]  [address]       Ph: [phone]       Fax: [phone]   RxID:   [id number]

## 2010-09-01 NOTE — Progress Notes (Signed)
Summary: RX  Phone Note Refill Request Call back at [phone]   Refills Requested: Medication #1:  OXYBUTYNIN CHLORIDE 5 MG TABS 1/2 tab two times a day [facility]  [address]--[phone]  Initial call taken by: Freddy Jaksch,  August 05, 2010 3:32 PM    Prescriptions: OXYBUTYNIN CHLORIDE 5 MG TABS (OXYBUTYNIN CHLORIDE) 1/2 tab two times a day  #30 x 4   Entered by:   Doristine Devoid CMA   Authorized by:   Neena Rhymes MD   Signed by:   Doristine Devoid CMA on 08/05/2010   Method used:   Electronically to        [facility] [address] [phone]* (retail)       [address]       [place], [place]  [address]       Ph: [phone]       Fax: [phone]   RxID:   [id number]

## 2010-09-01 NOTE — Progress Notes (Signed)
Summary: pravachol refill   Phone Note Refill Request Message from:  Fax from Pharmacy on [address] fax [phone]  Refills Requested: Medication #1:  PRAVACHOL 40 MG TABS 1 a day Initial call taken by: Barb Merino,  August 10, 2009 11:17 AM    Prescriptions: PRAVACHOL 40 MG TABS (PRAVASTATIN SODIUM) 1 a day  #30 x 3   Entered by:   Doristine Devoid   Authorized by:   Neena Rhymes MD   Signed by:   Doristine Devoid on 08/10/2009   Method used:   Electronically to        [facility] [address] [phone]* (retail)       [address]       [place], [place]  [address]       Ph: [phone]       Fax: [phone]   RxID:   [id number]

## 2010-09-01 NOTE — Progress Notes (Signed)
Summary: oxybutynin refill   Phone Note Refill Request Message from:  Fax from Pharmacy on Dec 07, 2009 11:58 AM  Refills Requested: Medication #1:  OXYBUTYNIN CHLORIDE 5 MG TABS 1/2 tab two times a day [facility] [address] [facility] - fax [phone]   Method Requested: Fax to Local Pharmacy Initial call taken by: Okey Regal Spring,  Dec 07, 2009 11:59 AM    Prescriptions: OXYBUTYNIN CHLORIDE 5 MG TABS (OXYBUTYNIN CHLORIDE) 1/2 tab two times a day  #30 x 2   Entered by:   Doristine Devoid   Authorized by:   Neena Rhymes MD   Signed by:   Doristine Devoid on 12/07/2009   Method used:   Electronically to        [facility] [address] [phone]* (retail)       [address]       [place], [place]  [address]       Ph: [phone]       Fax: [phone]   RxID:   [id number]

## 2010-09-01 NOTE — Progress Notes (Signed)
Summary: lortab-denied needs appt  Phone Note Refill Request Call back at [phone] Message from:  Pharmacy on November 22, 2009 10:51 AM  Refills Requested: Medication #1:  LORTAB 5 5-500 MG TABS 1 by mouth q4hours as needed-NEEDS TO SCHEDULE OFFICE VISIT BEFORE NEXT REFILL   Dosage confirmed as above?Dosage Confirmed   Supply Requested: 1 month   Last Refilled: 10/04/2009 [facility] [address]  Next Appointment Scheduled: none Initial call taken by: Harold Barban,  November 22, 2009 10:51 AM  Follow-up for Phone Call        denied needs office visit also mailed letter to schedule appt no appt scheduled ...Marland KitchenMarland KitchenDoristine Devoid  November 22, 2009 2:23 PM

## 2010-09-01 NOTE — Progress Notes (Signed)
Summary: lortab and pravachol refill   Phone Note Refill Request Message from:  Patient on May 30, 2010 10:25 AM  Refills Requested: Medication #1:  PRAVACHOL 40 MG TABS 1 a day  Medication #2:  LORTAB 5 5-500 MG TABS 1 by mouth q4hours as needed- [facility] [address] tel [phone]   ------ lortab was auth by dr Beverely Low [phone] but wasnt sent  Initial call taken by: Okey Regal Spring,  May 30, 2010 10:27 AM    Prescriptions: PRAVACHOL 40 MG TABS (PRAVASTATIN SODIUM) 1 a day  #30 x 3   Entered by:   Doristine Devoid CMA   Authorized by:   Neena Rhymes MD   Signed by:   Doristine Devoid CMA on 05/30/2010   Method used:   Printed then faxed to ...       [facility] [address] [phone]* (retail)       [address]       [place], [place]  [address]       Ph: [phone]       Fax: [phone]   RxID:   [id number] LORTAB 5 5-500 MG TABS (HYDROCODONE-ACETAMINOPHEN) 1 by mouth q4hours as needed-  #30 x 0   Entered by:   Doristine Devoid CMA   Authorized by:   Neena Rhymes MD   Signed by:   Doristine Devoid CMA on 05/30/2010   Method used:   Printed then faxed to ...       [facility] [address] [phone]* (retail)       [address]       [place], [place]  [address]       Ph: [phone]       Fax: [phone]   RxID:   [id number]

## 2010-09-01 NOTE — Miscellaneous (Signed)
  Clinical Lists Changes  Medications: Rx of CELEXA 20 MG TABS (CITALOPRAM HYDROBROMIDE) 1 by mouth once daily.;  #30 x 5;  Signed;  Entered by: Doristine Devoid CMA;  Authorized by: Neena Rhymes MD;  Method used: Electronically to [facility] [phone]*, [address] [place], [place]  [address], Ph: [phone], Fax: [phone]    Prescriptions: CELEXA 20 MG TABS (CITALOPRAM HYDROBROMIDE) 1 by mouth once daily.  #30 x 5   Entered by:   Doristine Devoid CMA   Authorized by:   Neena Rhymes MD   Signed by:   Doristine Devoid CMA on 08/25/2010   Method used:   Electronically to        [facility] [address] [phone]* (retail)       [address]       [place], [place]  [address]       Ph: [phone]       Fax: [phone]   RxID:   [id number]

## 2010-09-01 NOTE — Letter (Signed)
Summary: Primary Care Appointment Letter  [facility]  [address] [place], [place] [address]   Phone: [phone]  Fax: [phone]    11/12/2009 MRN: [id number]  [place] [place] [address] [place], [place]  [address]  Dear Ms. Tomasa Rand,   Your Primary Care Physician Neena Rhymes MD has indicated that:    ___X____it is time to schedule an appointment for BP AND CHOLESTEROL follow up.    _______you missed your appointment on______ and need to call and          reschedule.    _______you need to have lab work done.    _______you need to schedule an appointment discuss lab or test results.    _______you need to call to reschedule your appointment that is                       scheduled on _________.     Please call our office as soon as possible. Our phone number is [phone]____.Our office is open 8a-5p, Monday through Friday.     Thank you,    [facility]

## 2010-09-01 NOTE — Progress Notes (Signed)
Summary: left msg for pt to call re lab vit d  Phone Note Outgoing Call   Call placed by: Kandice Hams,  Dec 17, 2009 10:40 AM Call placed to: Patient Summary of Call: left msg for pt to call re vit D lab .Kandice Hams  Dec 17, 2009 10:40 AM level is low- needs 50,000 units weekly x 12 weeks and then recheck   Follow-up for Phone Call        Spoke with pt informed of lab wants rx sent to [facility] [address]  .Kandice Hams  Dec 17, 2009 11:13 AM  Follow-up by: Kandice Hams,  Dec 17, 2009 11:13 AM    New/Updated Medications: ERGOCALCIFEROL 50000 UNIT CAPS (ERGOCALCIFEROL) 1 by mouth weekly for 12 weeks then recheck lab Prescriptions: ERGOCALCIFEROL 50000 UNIT CAPS (ERGOCALCIFEROL) 1 by mouth weekly for 12 weeks then recheck lab  #12 x 0   Entered by:   Kandice Hams   Authorized by:   Neena Rhymes MD   Signed by:   Kandice Hams on 12/17/2009   Method used:   Faxed to ...       [facility] [address] [phone]* (retail)       [address]       [place], [place]  [address]       Ph: [phone]       Fax: [phone]   RxID:   [id number]

## 2010-09-01 NOTE — Progress Notes (Signed)
Summary: refill   Phone Note Refill Request Message from:  Patient  Refills Requested: Medication #1:  LORTAB 5 5-500 MG TABS 1 by mouth q4hours as needed- [facility] [address]  Initial call taken by: Jeremy Johann CMA,  April 22, 2010 2:39 PM  Follow-up for Phone Call        last filled 03-18-10 #30,  last OV 03-22-10..............Marland KitchenFelecia Deloach CMA  April 22, 2010 2:41 PM   Additional Follow-up for Phone Call Additional follow up Details #1::        refill x1 Additional Follow-up by: Loreen Freud DO,  April 22, 2010 3:03 PM    Prescriptions: LORTAB 5 5-500 MG TABS (HYDROCODONE-ACETAMINOPHEN) 1 by mouth q4hours as needed-  #30 x 0   Entered by:   Jeremy Johann CMA   Authorized by:   Loreen Freud DO   Signed by:   Jeremy Johann CMA on 04/22/2010   Method used:   Printed then faxed to ...       [facility] [address] [phone]* (retail)       [address]       [place], [place]  [address]       Ph: [phone]       Fax: [phone]   RxID:   [id number]

## 2010-09-01 NOTE — Progress Notes (Signed)
Summary: refill  Phone Note Refill Request Message from:  Fax from Pharmacy on October 21, 2009 10:29 AM  Refills Requested: Medication #1:  OXYBUTYNIN CHLORIDE 5 MG TABS 1/2 tab two times a day [facility] [address] fax [phone]   Method Requested: Fax to Local Pharmacy Next Appointment Scheduled: no appt Initial call taken by: Barb Merino,  October 21, 2009 10:30 AM    Prescriptions: OXYBUTYNIN CHLORIDE 5 MG TABS (OXYBUTYNIN CHLORIDE) 1/2 tab two times a day  #30 x 0   Entered by:   Doristine Devoid   Authorized by:   Neena Rhymes MD   Signed by:   Doristine Devoid on 10/21/2009   Method used:   Electronically to        [facility] [address] [phone]* (retail)       [address]       [place], [place]  [address]       Ph: [phone]       Fax: [phone]   RxID:   [id number]

## 2010-09-01 NOTE — Progress Notes (Signed)
Summary: lortab-denied done5/19/11  Phone Note Refill Request Call back at [phone] Message from:  Pharmacy on Dec 21, 2009 8:54 AM  Refills Requested: Medication #1:  LORTAB 5 5-500 MG TABS 1 by mouth q4hours as needed-   Dosage confirmed as above?Dosage Confirmed   Supply Requested: 1 month   Last Refilled: 11/23/2009 [facility] [address] in [place]  Next Appointment Scheduled: none Initial call taken by: Harold Barban,  Dec 21, 2009 8:54 AM  Follow-up for Phone Call        done 12/16/09 at patient office visit .........Marland KitchenDoristine Devoid  Dec 21, 2009 9:27 AM

## 2010-09-01 NOTE — Progress Notes (Signed)
Summary: lortab refill   Phone Note Refill Request Message from:  Patient on November 23, 2009 4:27 PM  Refills Requested: Medication #1:  LORTAB 5 5-500 MG TABS 1 by mouth q4hours as needed-NEEDS TO SCHEDULE OFFICE VISIT BEFORE NEXT REFILL Initial call taken by: Doristine Devoid,  November 23, 2009 4:27 PM  Follow-up for Phone Call        has pending appt 12/16/09.Marland KitchenMarland KitchenDoristine Devoid  November 23, 2009 4:30 PM     New/Updated Medications: LORTAB 5 5-500 MG TABS (HYDROCODONE-ACETAMINOPHEN) 1 by mouth q4hours as needed- Prescriptions: LORTAB 5 5-500 MG TABS (HYDROCODONE-ACETAMINOPHEN) 1 by mouth q4hours as needed-  #30 x 0   Entered by:   Doristine Devoid   Authorized by:   Neena Rhymes MD   Signed by:   Doristine Devoid on 11/23/2009   Method used:   Printed then faxed to ...       [facility] [address] [phone]* (retail)       [address]       [place], [place]  [address]       Ph: [phone]       Fax: [phone]   RxID:   [id number]

## 2010-09-01 NOTE — Assessment & Plan Note (Signed)
Summary: CPX--PH   Vital Signs:  Patient profile:   65 year old female Height:      65 inches Weight:      208 pounds BMI:     34.74 Pulse rate:   68 / minute BP sitting:   128 / 72  (left arm)  Vitals Entered By: Doristine Devoid (Dec 16, 2009 8:14 AM) CC: CPX AND LAB   History of Present Illness: 65 yo woman here today for yearly.  GYN- Dr Nicholas Lose, UTD on pap/mammogram.  colonoscopy 3-4 yrs ago.  'total fog'- pt reports there will be a day every 1-2 months where she's in 'a total fog'.  feels mentally slow.  difficulty with directions, simple tasks.  'it's like i'm there but i'm not'.  forgetful.  pt reports episodes are happening more frequently.  HTN- BP adequately controlled and pt reports she is not taking Losartan.  denies CP, SOB, N/V, HAs  Hyperlipidemia- due for labs.  tolerating statin and fenofibrate.  no myalgias, N/V, abd pain  COPD- not taking inhalers as directed, 'i can't afford'.  follows w/ Dr Delford Field.   Preventive Screening-Counseling & Management  Alcohol-Tobacco     Smoking Status: current     Smoking Cessation Counseling: yes     Smoke Cessation Stage: contemplative     Packs/Day: 0.5  Caffeine-Diet-Exercise     Does Patient Exercise: yes     Type of exercise: walking     Times/week: 6      Sexual History:  currently monogamous.        Drug Use:  never.    Problems Prior to Update: 1)  Memory Loss  (ICD-780.93) 2)  Vocal Cord Disorder  (ICD-478.5) 3)  Carotid Stenosis  (ICD-433.10) 4)  Carotid Bruit  (ICD-785.9) 5)  B12 Deficiency  (ICD-266.2) 6)  Chest Pain  (ICD-786.50) 7)  Hypertension  (ICD-401.9) 8)  Hyperlipidemia  (ICD-272.4) 9)  Fatigue  (ICD-780.79) 10)  Gerd  (ICD-530.81) 11)  Chronic Obstructive Pulmonary Disease, Acute Exacerbation  (ICD-491.21) 12)  Unspecified Otitis Media  (ICD-382.9) 13)  Personal Hx Colonic Polyps  (ICD-V12.72) 14)  Irritable Bowel Syndrome  (ICD-564.1) 15)  Diarrhea  (ICD-787.91) 16)  Strep Throat   (icd-034.0) 17)  Back Pain  (ICD-724.5) 18)  Overactive Bladder  (ICD-596.51) 19)  Depression  (ICD-311) 20)  COPD  (ICD-496) 21)  Asthma  (ICD-493.90) 22)  Sleep Apnea  (ICD-780.57) 23)  Tobacco Abuse  (ICD-305.1) 24)  Rosacea  (ICD-695.3) 25)  Colonoscopy, Hx of  (ICD-V12.79)  Current Medications (verified): 1)  Klonopin 0.5 Mg Tabs (Clonazepam) .... Take 1 1/2 Tablet By Mouth At Bedtime, Needs Office Visit Before Next Refill 2)  Lortab 5 5-500 Mg Tabs (Hydrocodone-Acetaminophen) .Marland Kitchen.. 1 By Mouth Q4hours As Needed- 3)  Oxybutynin Chloride 5 Mg Tabs (Oxybutynin Chloride) .... 1/2 Tab Two Times A Day 4)  Pravachol 40 Mg Tabs (Pravastatin Sodium) .Marland Kitchen.. 1 A Day 5)  Albuterol Sulfate (5 Mg/ml) 0.5% Nebu (Albuterol Sulfate) .Marland Kitchen.. 1 Every 4-6 Hours As Needed.  Disp 1 Box. 6)  Omeprazole 40 Mg Cpdr (Omeprazole) .Marland Kitchen.. 1 Tab By Mouth Daily 7)  Fenofibrate 160 Mg Tabs (Fenofibrate) .... Take One Tablet Daily 8)  Hyomax-Sr 0.375 Mg Xr12h-Tab (Hyoscyamine Sulfate) .... Take 1 Tablet By Mouth Two Times A Day 9)  Vitamin B Injection .... Once A Month 10)  Tessalon Perles 100 Mg  Caps (Benzonatate) .... Two Every 4-6 Hours 11)  Celexa 20 Mg Tabs (Citalopram Hydrobromide) .Marland Kitchen.. 1 By Mouth  Once Daily. 12)  Tramadol Hcl 50 Mg  Tabs (Tramadol Hcl) .... One To Two By Mouth Every 4-6 Hours  Allergies (verified): 1)  ! Bactrim 2)  ! Tramadol Hcl  Past History:  Past Surgical History: Last updated: 04/21/2009 shoulder surgery Hernia repair Spinal fusion: cervical Hysterectomy Oophorectomy Tonsillectomy  Family History: Last updated: 04/08/2009 No FH of Colon Cancer: no family history of inflammatory bowel disease or celiac sprue. 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  now on disability Patient currently smokes 3 cig/day.  1ppd x65yrs.    Alcohol Use - no  Past Medical History: Current Problems:  HYPERTENSION (ICD-401.9) HYPERLIPIDEMIA (ICD-272.4) GERD (ICD-530.81) UNSPECIFIED OTITIS MEDIA (ICD-382.9) PERSONAL HX COLONIC POLYPS (ICD-V12.72) IRRITABLE BOWEL SYNDROME (ICD-564.1) DIARRHEA (ICD-787.91) OVERACTIVE BLADDER (ICD-596.51) DEPRESSION (ICD-311) COPD (ICD-496) ASTHMA (ICD-493.90) SLEEP APNEA (ICD-780.57) TOBACCO ABUSE (ICD-305.1) ROSACEA (ICD-695.3) OAB Diverticulosis Hemorrhoids Macular degeneration- Dr Jackquline Bosch  Social History: Smoking Status:  current Packs/Day:  0.5 Does Patient Exercise:  yes Drug Use:  never  Review of Systems       The patient complains of vision loss.  The patient denies anorexia, fever, weight loss, weight gain, decreased hearing, hoarseness, chest pain, syncope, dyspnea on exertion, peripheral edema, prolonged cough, headaches, abdominal pain, melena, hematochezia, severe indigestion/heartburn, hematuria, suspicious skin lesions, difficulty walking, depression, abnormal bleeding, enlarged lymph nodes, and breast masses.         macular degeneration  Physical Exam  General:  alert, well-developed, and overweight-appearing.   Head:  Normocephalic and atraumatic.   Eyes:  PERRL, EOMI, fundoscopic exam deferred to ophtho Ears:  External ear exam shows no significant lesions or deformities.  Otoscopic examination reveals clear canals, tympanic membranes are intact bilaterally without bulging, retraction, inflammation or discharge. Hearing is grossly normal bilaterally. Nose:  External nasal examination shows no deformity or inflammation. Nasal mucosa are pink and moist without lesions or exudates. Mouth:  Oral mucosa and oropharynx without lesions or exudates. Neck:  Supple; no masses or thyromegaly. Breasts:  deferred to gyn Lungs:  decreased breath sounds throughout Heart:  Regular rate and rhythm; no murmurs, rubs,  or bruits. Abdomen:  soft, NT/ND, +BS Genitalia:   deferred to gyn Pulses:  +2 carotid, radial, DP Extremities:  no C/C/E, full ROM of joints throughout corn on L 4th toe Neurologic:  No cranial nerve deficits noted. Station and gait are normal. Plantar reflexes are down-going bilaterally. DTRs are symmetrical throughout. Sensory, motor and coordinative functions appear intact. Skin:  Intact without suspicious lesions or rashes Cervical Nodes:  No lymphadenopathy noted Axillary Nodes:  No palpable lymphadenopathy Psych:  Cognition and judgment appear intact. Alert and cooperative with normal attention span and concentration. No apparent delusions, illusions, hallucinations   Impression & Recommendations:  Problem # 1:  HYPERTENSION (ICD-401.9) Assessment Unchanged BP adequate w/out meds.  will stop Losartan and monitor. The following medications were removed from the medication list:    Losartan Potassium 100 Mg Tabs (Losartan potassium) ..... One by mouth daily pt needs to schedule appt with dr. Delford Field  Orders: TLB-BMP (Basic Metabolic Panel-BMET) (80048-METABOL) TLB-CBC Platelet - w/Differential (85025-CBCD)  Problem # 2:  HYPERLIPIDEMIA (ICD-272.4) Assessment: Unchanged tolerating meds.  due for labs. Her updated medication list for this problem includes:    Pravachol 40 Mg Tabs (Pravastatin sodium) .Marland Kitchen... 1 a day    Fenofibrate 160 Mg Tabs (Fenofibrate) .Marland Kitchen... Take one tablet daily  Orders: Venipuncture (16109) TLB-Lipid Panel (80061-LIPID) TLB-Hepatic/Liver Function Pnl (80076-HEPATIC) Prescription Created Electronically [phone])  Problem # 3:  COPD (ICD-496) Assessment: Unchanged pt noncompliant w/ meds.  samples of Spiriva given.  encouraged pt to use meds as directed by Dr Delford Field. The following medications were removed from the medication list:    Proair Hfa 108 (90 Base) Mcg/act Aers (Albuterol sulfate) .Marland Kitchen..Marland Kitchen Two puffs every 6 hours as needed for cough and congestion    Spiriva Handihaler 18 Mcg Caps (Tiotropium bromide  monohydrate) .Marland Kitchen... 2 puffs daily Her updated medication list for this problem includes:    Albuterol Sulfate (5 Mg/ml) 0.5% Nebu (Albuterol sulfate) .Marland Kitchen... 1 every 4-6 hours as needed.  disp 1 box.    Spiriva Handihaler 18 Mcg Caps (Tiotropium bromide monohydrate) .Marland Kitchen... Contents of one capsule inhaled daily  Problem # 4:  MEMORY LOSS (ICD-780.93) Assessment: New pt reports will have days where she is 'in a fog'.  occuring more frequently.  check labs.  if labs normal will refer to neuro for complete evaluation.  Pt expresses understanding and is in agreement w/ this plan. Orders: T-Vitamin D (25-Hydroxy) [id number]) TLB-TSH (Thyroid Stimulating Hormone) (84443-TSH) TLB-B12 + Folate Pnl [phone])  Complete Medication List: 1)  Klonopin 0.5 Mg Tabs (Clonazepam) .... Take 1 1/2 tablet by mouth at bedtime, needs office visit before next refill 2)  Lortab 5 5-500 Mg Tabs (Hydrocodone-acetaminophen) .Marland Kitchen.. 1 by mouth q4hours as needed- 3)  Oxybutynin Chloride 5 Mg Tabs (Oxybutynin chloride) .... 1/2 tab two times a day 4)  Pravachol 40 Mg Tabs (Pravastatin sodium) .Marland Kitchen.. 1 a day 5)  Albuterol Sulfate (5 Mg/ml) 0.5% Nebu (Albuterol sulfate) .Marland Kitchen.. 1 every 4-6 hours as needed.  disp 1 box. 6)  Omeprazole 40 Mg Cpdr (Omeprazole) .Marland Kitchen.. 1 tab by mouth daily 7)  Fenofibrate 160 Mg Tabs (Fenofibrate) .... Take one tablet daily 8)  Hyomax-sr 0.375 Mg Xr12h-tab (Hyoscyamine sulfate) .... Take 1 tablet by mouth two times a day 9)  Vitamin B Injection  .... Once a month 10)  Tessalon Perles 100 Mg Caps (Benzonatate) .... Two every 4-6 hours 11)  Celexa 20 Mg Tabs (Citalopram hydrobromide) .Marland Kitchen.. 1 by mouth once daily. 12)  Tramadol Hcl 50 Mg Tabs (Tramadol hcl) .... One to two by mouth every 4-6 hours 13)  Spiriva Handihaler 18 Mcg Caps (Tiotropium bromide monohydrate) .... Contents of one capsule inhaled daily  Patient Instructions: 1)  Please schedule a follow-up appointment in 3 months to recheck  blood pressure. 2)  Please follow up with Dr Delford Field as directed for your breathing 3)  We'll notify you of your lab results- if the labs don't give [place] any information on your forgetfulness we will proceed w/ neurology referral 4)  Continue to make healthy food choices and try and get regular exercise 5)  STOP SMOKING! 6)  Call with any questions or concerns 7)  Hang in there!!  Prescriptions: LORTAB 5 5-500 MG TABS (HYDROCODONE-ACETAMINOPHEN) 1 by mouth q4hours as needed-  #30 x 0   Entered and Authorized by:   Neena Rhymes MD   Signed by:   Neena Rhymes MD on 12/16/2009   Method used:   Print then Give to Patient   RxID:   [id number] KLONOPIN 0.5 MG TABS (CLONAZEPAM) Take 1 1/2 tablet by mouth at bedtime, NEEDS OFFICE VISIT BEFORE NEXT REFILL  #135 x 0   Entered and Authorized by:   Neena Rhymes MD   Signed by:   Neena Rhymes MD on 12/16/2009  Method used:   Print then Give to Patient   RxID:   [id number] TRAMADOL HCL 50 MG  TABS (TRAMADOL HCL) One to two by mouth every 4-6 hours  #60 x 1   Entered and Authorized by:   Neena Rhymes MD   Signed by:   Neena Rhymes MD on 12/16/2009   Method used:   Electronically to        [facility] [address] [phone]* (retail)       [address]       [place], [place]  [address]       Ph: [phone]       Fax: [phone]   RxID:   [id number] CELEXA 20 MG TABS (CITALOPRAM HYDROBROMIDE) 1 by mouth once daily.  #30 x 5   Entered and Authorized by:   Neena Rhymes MD   Signed by:   Neena Rhymes MD on 12/16/2009   Method used:   Electronically to        [facility] [address] [phone]* (retail)       [address]       [place], [place]  [address]       Ph: [phone]       Fax: [phone]   RxID:   [id number] FENOFIBRATE 160 MG TABS (FENOFIBRATE) take one tablet daily  #30 x 6   Entered and Authorized by:   Neena Rhymes MD   Signed by:   Neena Rhymes MD on 12/16/2009   Method  used:   Electronically to        [facility] [address] [phone]* (retail)       [address]       [place], [place]  [address]       Ph: [phone]       Fax: [phone]   RxID:   [id number] OMEPRAZOLE 40 MG CPDR (OMEPRAZOLE) 1 tab by mouth daily  #30 x 3   Entered and Authorized by:   Neena Rhymes MD   Signed by:   Neena Rhymes MD on 12/16/2009   Method used:   Electronically to        [facility] [phone]* (retail)       [address]       [place], [place]  [address]       Ph: [phone]       Fax: [phone]   RxID:   [id number] ALBUTEROL SULFATE (5 MG/ML) 0.5% NEBU (ALBUTEROL SULFATE) 1 every 4-6 hours as needed.  disp 1 box.  #1 x 3   Entered and Authorized by:   Neena Rhymes MD   Signed by:   Neena Rhymes MD on 12/16/2009   Method used:   Electronically to        [facility] [phone]* (retail)       [address]       [place], [place]  [address]       Ph: [phone]       Fax: [phone]   RxID:   [id number] PRAVACHOL 40 MG TABS (PRAVASTATIN SODIUM) 1 a day  #30 x 3   Entered and Authorized by:   Neena Rhymes MD   Signed by:   Neena Rhymes MD on 12/16/2009   Method used:   Electronically to        [facility] [address] [phone]* (retail)       [address]       [place], [place]  [address]       Ph: [phone]       Fax: [phone]   RxID:   [id number]

## 2010-09-23 ENCOUNTER — Telehealth (INDEPENDENT_AMBULATORY_CARE_PROVIDER_SITE_OTHER): Payer: Self-pay | Admitting: *Deleted

## 2010-09-27 NOTE — Progress Notes (Signed)
Summary: Refill  Phone Note Refill Request Call back at [phone] Message from:  Patient on September 23, 2010 11:46 AM  Refills Requested: Medication #1:  LORTAB 5 5-500 MG TABS 1 by mouth q4hours as needed-   Dosage confirmed as above?Dosage Confirmed   Supply Requested: 1 month Quantity of 60. [facility] [address]  Next Appointment Scheduled: none Initial call taken by: Lavell Islam,  September 23, 2010 11:46 AM    Prescriptions: LORTAB 5 5-500 MG TABS (HYDROCODONE-ACETAMINOPHEN) 1 by mouth q4hours as needed-  #60 x 0   Entered by:   Doristine Devoid CMA   Authorized by:   Neena Rhymes MD   Signed by:   Doristine Devoid CMA on 09/23/2010   Method used:   Printed then faxed to ...       [facility] [phone]* (retail)       [address]       [place], [place]  [address]       Ph: [phone]       Fax: [phone]   RxID:   [id number]

## 2010-09-29 ENCOUNTER — Telehealth: Payer: Self-pay | Admitting: Family Medicine

## 2010-10-04 ENCOUNTER — Encounter: Payer: Self-pay | Admitting: Internal Medicine

## 2010-10-06 NOTE — Progress Notes (Signed)
Summary: Pravachol refill  Phone Note Refill Request Message from:  Fax from Pharmacy on September 29, 2010 9:32 AM  Refills Requested: Medication #1:  PRAVACHOL 40 MG TABS 1 a day [id number],  [address], [place], [place]  phone -   ,      fax - [phone]    qty - 30  Next Appointment Scheduled: none Initial call taken by: Jerolyn Shin,  September 29, 2010 9:34 AM    Prescriptions: PRAVACHOL 40 MG TABS (PRAVASTATIN SODIUM) 1 a day  #30 x 2   Entered by:   Lucious Groves CMA   Authorized by:   Neena Rhymes MD   Signed by:   Lucious Groves CMA on 09/29/2010   Method used:   Electronically to        [facility] [phone]* (retail)       [address]       [place], [place]  [address]       Ph: [phone]       Fax: [phone]   RxID:   [id number]

## 2010-10-11 NOTE — Miscellaneous (Signed)
Summary: Order for CPAP Supplies/[facility]  Order for CPAP Supplies/[facility]   Imported By: Maryln Gottron 10/07/2010 12:22:20  _____________________________________________________________________  External Attachment:    Type:   Image     Comment:   External Document

## 2010-11-01 ENCOUNTER — Other Ambulatory Visit: Payer: Self-pay | Admitting: Family Medicine

## 2010-11-01 NOTE — Telephone Encounter (Signed)
#  60 given on 09/23/10. Please advise.

## 2010-12-12 ENCOUNTER — Other Ambulatory Visit: Payer: Self-pay | Admitting: Family Medicine

## 2010-12-13 ENCOUNTER — Encounter: Payer: Self-pay | Admitting: *Deleted

## 2010-12-13 NOTE — Telephone Encounter (Signed)
Hard copy faxed. Called to notify pt and voicemail is not set up yet. Mailed letter.

## 2010-12-13 NOTE — Procedures (Signed)
NAMETASHAYA, ANCRUM NO.:  [id number]   MEDICAL RECORD NO.:  [id number]          PATIENT TYPE:  OUT   LOCATION:  SLEEP CENTER                 FACILITY:  [facility]   PHYSICIAN:  Barbaraann Share, MD,FCCPDATE OF BIRTH:  1945/[birth year]/25   DATE OF STUDY:  01/23/2007                            NOCTURNAL POLYSOMNOGRAM   REFERRING PHYSICIAN:  Barbaraann Share, MD,FCCP   INDICATIONS FOR PROCEDURE:  Hypersomnia with sleep apnea.   RESULTS:  Epward score is 6.   SLEEP ARCHITECTURE:  Total sleep time of 300 minutes with only 20  minutes of REM and no slow wave sleep. Sleep onset latency was normal  and REM onset was very prolonged at 331 minutes. Sleep efficiency was  decreased at 81%.   RESPIRATORY DATA:  The patient underwent a bi-level titration study with  a ResMed medium Quattro full face mask. The patient was started on an  inspiratory pressure of 8 and an expiratory pressure of 5 and  ultimately, increased for both obstructed events and snoring. It  appeared the patient had the best control between 17 and 20 cm on the  inspiratory side and 14 cm on the expiratory side. Tolerance seemed to  be excellent.   OXYGEN DATA:  There was O2 desaturation as low as 90% with obstructive  events.   CARDIAC DATA:  No clinically significant cardiac arrhythmias were noted.   MOVEMENT/PARASOMNIA:  Small numbers of leg jerks with very little sleep  disruption.   IMPRESSION/RECOMMENDATIONS:  1. Good control of previously documented obstructive sleep apnea with      an inspiratory pressure of 17 to 20 and an expiratory pressure of      14 cm. This was delivered with a ResMed medium Quattro full face      mask. The patient should also be encouraged to work on weight loss.      Barbaraann Share, MD,FCCP  Diplomate, American Board of Sleep  Medicine  Electronically Signed     [facility]/MEDQ  D:  01/29/2007 14:08:03  T:  01/29/2007 16:10:96  Job:  [id number]

## 2010-12-13 NOTE — Telephone Encounter (Signed)
Last refilled 11/01/10. Per [facility] pt appears to be due for physical. Please advise.

## 2010-12-13 NOTE — Telephone Encounter (Signed)
Ok for #60, no refills.  Please have her make appt

## 2010-12-16 NOTE — Discharge Summary (Signed)
Ashley Woods, ROZZELL NO.:  [id number]   MEDICAL RECORD NO.:  [id number]          PATIENT TYPE:  OIB   LOCATION:  5731                         FACILITY:  [facility]   PHYSICIAN:  Gabrielle Dare. [name], M.D.DATE OF BIRTH:  [birth year]   DATE OF ADMISSION:  02/17/2005  DATE OF DISCHARGE:  02/20/2005                                 DISCHARGE SUMMARY   DISCHARGE DIAGNOSIS:  Status post laparoscopic repair of umbilical and  incisional hernia.   HISTORY OF PRESENT ILLNESS:  The patient is a 64 year-old white female who  presented for elective laparoscopic repair of umbilical and lower midline  ventral incisional hernia.   HOSPITAL COURSE:  The patient underwent an uncomplicated laparoscopic repair  of her umbilical and lower midline ventral incisional hernia.  Postoperatively, she remained afebrile and hemodynamically stable. She  initially had mild postoperative ileus but tolerated some sips of clear  liquids. This was gradually advanced over the next two postoperative days.  She had initially required intravenous pain medication for adequate pain  control. She was gradually able to tolerate her pain with oral medications.  She remained afebrile and hemodynamically stable. She used her CPAP  generator at night and on 02/20/2005 was significantly improved and was  discharged home in stable condition on postoperative day three.   DISCHARGE DIET:  Regular.   DISCHARGE ACTIVITY:  No lifting.   DISCHARGE MEDICATIONS:  Percocet 5/325, one to two every 4-6 hours as needed  for pain. The patient is also to resume her home medications.   FOLLOWUP:  Follow up in 3 weeks with myself.       BET/MEDQ  D:  02/20/2005  T:  02/20/2005  Job:  [id number]

## 2010-12-16 NOTE — Op Note (Signed)
Ashley Woods, Ashley Woods NO.:  [id number]   MEDICAL RECORD NO.:  [id number]          PATIENT TYPE:  OIB   LOCATION:  2550                         FACILITY:  [facility]   PHYSICIAN:  Gabrielle Dare. [name], M.D.DATE OF BIRTH:  [birth year]   DATE OF PROCEDURE:  02/17/2005  DATE OF DISCHARGE:                                 OPERATIVE REPORT   PREOPERATIVE DIAGNOSES:  1.  Umbilical hernia.  2.  Incisional hernia.   POSTOPERATIVE DIAGNOSES:  1.  Umbilical hernia.  2.  Incisional hernia.   PROCEDURES:  Laparoscopic repair of umbilical and incisional hernias with  mesh.   SURGEON:  Gabrielle Dare. [name], M.D.   ASSISTANT:  Currie Paris, M.D.   HISTORY OF PRESENT ILLNESS:  The patient is a 65 year old white female whom  I evaluated the office for a symptomatic umbilical hernia.  She also has had  a lower midline incision for hysterectomy.  The superior portion of this had  some fascial weakness as well, and the patient had been having some  discomfort there.  The patient presents today for elective laparoscopic  repair of the umbilical hernia and the incisional hernia associated with the  superior portion of her incision.   PROCEDURE IN DETAIL:  Informed consent was obtained and the patient was  identified.  She was brought to the operating room.  General anesthesia was administered.  Her abdomen was prepped and draped in  a sterile fashion.  An Ioban occlusive adherent dressing was applied.  A  left lateral incision was made in the midabdomen.  Subcutaneous tissues were  dissected down and the anterior fascia was divided sharply and the  peritoneal cavity was then entered under direct vision.  A 0 Vicryl  pursestring suture was placed around the fascial opening and the Hasson  trocar was inserted into the abdomen.  The abdomen was insufflated with  carbon dioxide in a standard fashion.  Exploration of the abdomen revealed  the umbilical hernia, and there were a lot of  filmy adhesions up to her  lower midline incision.  At this time under direct vision, a 5 mm left lower  quadrant port and an 11 mm right midabdomen lateral port were placed.  Marcaine 0.25% with epinephrine was used and all port sites.  At this time  the adhesions were taken down from the anterior abdominal wall with careful  blunt and sharp dissection.  There were some bowel loops lower down in the adhesive material, but it was  not necessary to manipulate these excessively.  the anterior bowel wall was  cleared away of all of these adhesions without any difficulties.  Once this  was accomplished, the abdominal wall was inspected she indeed had an  umbilical hernia defect, which was easily visible.  In addition, she had  some weakness in the superior portion of her lower midline incision fascia  with no large hernia sac present.  At this time a spinal needle was used to  trace out 4-5 cm circumferentially from each defect so as to fashion the  mesh to overlap both weakened areas with at least 4-5  cm circumferentially.  This area was measured out and it was approximately 10 x 14 cm.  A piece of  Proceed mesh was then fashioned to size.  This was marked for orientation  along the anterior abdominal wall.  Prolene 0 sutures were placed at the 12  o'clock, 3 o'clock, 6 o'clock and 9 o'clock positions and four more  intervening sutures were placed in the mesh in between each of those initial  sutures for a total of eight.  The mesh was rolled up and slid into the  abdomen through the Hasson trocar.  The mesh was unfurled and oriented  according to our markings in the abdomen.  Subsequently eight small stab  wounds were made along our pre-marked areas in order to fish out the  sutures, and the EndoCatch was used to grasp each pair of sutures through  separate fascial stab wounds, and this was done circumferentially.  Once all  the sutures were pulled through the abdominal wall out through the  small  stab wounds, each was tied securely, folding the mesh up nicely against the  abdominal wall.  The mesh was then circumferentially tacked with a surgical  tacker, and it laid out very nicely.  Once the peripheral packs were placed,  an  inner concentric ring of tacks was also placed to get excellent  fixation.  The mesh was checked and had good circumferential tacks holding  it in place.  The abdomen was again explored with the laparoscope.  There  was no bleeding from the omental adhesions that we took down, and no other  abnormalities were noted, and the pneumoperitoneum was released after  removing the ports under direct vision.  The Hasson trocar was removed.  The  fascia at the Hasson port site was closed by tying the 0 Vicryl pursestring  suture.  All wounds were copiously irrigated.  The skin of the three port  sites was closed with running 4-0 Monocryl subcuticular stitch, and the  small stab wounds for the sutures of the mesh were closed with Benzoin and  Steri-Strips.  Benzoin and Steri-Strips was placed on the port site wounds.  Sterile dressings were placed on all wounds and the patient tolerated the  procedure well.  Sponge, needle and instrument counts were correct, and she was taken the  recovery room in stable condition.  There were no apparent complications.       BET/MEDQ  D:  02/17/2005  T:  02/17/2005  Job:  [id number]

## 2010-12-30 ENCOUNTER — Other Ambulatory Visit: Payer: Self-pay | Admitting: Family Medicine

## 2010-12-30 NOTE — Telephone Encounter (Signed)
Refill sent and mailed letter to schedule CPX.

## 2011-01-03 ENCOUNTER — Ambulatory Visit (INDEPENDENT_AMBULATORY_CARE_PROVIDER_SITE_OTHER): Payer: Self-pay | Admitting: Family Medicine

## 2011-01-03 ENCOUNTER — Encounter: Payer: Self-pay | Admitting: *Deleted

## 2011-01-03 DIAGNOSIS — L608 Other nail disorders: Secondary | ICD-10-CM | POA: Insufficient documentation

## 2011-01-03 DIAGNOSIS — R5381 Other malaise: Secondary | ICD-10-CM

## 2011-01-03 DIAGNOSIS — E162 Hypoglycemia, unspecified: Secondary | ICD-10-CM

## 2011-01-03 DIAGNOSIS — E785 Hyperlipidemia, unspecified: Secondary | ICD-10-CM

## 2011-01-03 DIAGNOSIS — L609 Nail disorder, unspecified: Secondary | ICD-10-CM

## 2011-01-03 DIAGNOSIS — J449 Chronic obstructive pulmonary disease, unspecified: Secondary | ICD-10-CM

## 2011-01-03 LAB — CBC WITH DIFFERENTIAL/PLATELET
Eosinophils Relative: 2 % (ref 0.0–5.0)
HCT: 44.9 % (ref 36.0–46.0)
Lymphs Abs: 1.4 10*3/uL (ref 0.7–4.0)
Monocytes Relative: 6.4 % (ref 3.0–12.0)
Neutrophils Relative %: 71.9 % (ref 43.0–77.0)
Platelets: 187 10*3/uL (ref 150.0–400.0)
RBC: 4.71 Mil/uL (ref 3.87–5.11)
WBC: 7.5 10*3/uL (ref 4.5–10.5)

## 2011-01-03 LAB — BASIC METABOLIC PANEL
CO2: 27 mEq/L (ref 19–32)
Glucose, Bld: 94 mg/dL (ref 70–99)
Potassium: 4.2 mEq/L (ref 3.5–5.1)
Sodium: 140 mEq/L (ref 135–145)

## 2011-01-03 LAB — HEPATIC FUNCTION PANEL
AST: 20 U/L (ref 0–37)
Albumin: 4.1 g/dL (ref 3.5–5.2)
Alkaline Phosphatase: 69 U/L (ref 39–117)
Total Protein: 6.5 g/dL (ref 6.0–8.3)

## 2011-01-03 LAB — LDL CHOLESTEROL, DIRECT: Direct LDL: 125.1 mg/dL

## 2011-01-03 NOTE — Patient Instructions (Signed)
Please schedule your complete physical in 4-6 weeks We'll notify you of your lab results Take the Advair daily- 1 puff twice a day Use the Spiriva daily Use the Albuterol as needed We will call you with your podiatry appt Call with any questions or concerns Hang in there!

## 2011-01-03 NOTE — Assessment & Plan Note (Signed)
Persistent problem for pt.  Check labs.  My be related to poor lung fxn and uncontrolled COPD.

## 2011-01-03 NOTE — Assessment & Plan Note (Signed)
Due for labs.  Will adjust meds prn.

## 2011-01-03 NOTE — Progress Notes (Signed)
  Subjective:    Patient ID: Ashley Woods, female    DOB: 06-28-1946, 65 y.o.   MRN: [id number]  HPI Toenail wound- R 2nd nail fell off on Sunday and is now open sore.  Has hx of nail fungus and wart on L 4th toe.  Pt aware that she needs a podiatrist.  Hyperlipidemia- pt overdue for labs.  On pravastatin w/out difficulty.  Denies abd pain, N/V, myalgias  Hypoglycemia- pt reports that she will have intermittant episodes where she gets hot, flushed, shaky, dizzy and feels the need to eat.  sxs improve ~20 minutes after eating.  Chronic pain- seeing Dr Aggie Cosier at pain management, having some numbness/tingling of extremities.  SOB- following w/ Dr Delford Field, has not been using Spiriva regularly- just restarted.  Using Albuterol as needed.  Has Advair at home but is not currently using.  Had episode the other night where she woke up wheezing and gasping for breath.  Fatigue- chronic problem for pt.  Still not feeling well.  No specific complaints.   Review of Systems For ROS see HPI     Objective:   Physical Exam  Constitutional: She is oriented to person, place, and time. She appears well-developed and well-nourished. No distress.  HENT:  Head: Normocephalic and atraumatic.  Eyes: Conjunctivae and EOM are normal. Pupils are equal, round, and reactive to light.  Neck: Normal range of motion. Neck supple. No thyromegaly present.  Cardiovascular: Normal rate, regular rhythm, normal heart sounds and intact distal pulses.   Pulmonary/Chest: Effort normal. No respiratory distress. She has wheezes (diffusely w/ prolonged expiratory phase).  Abdominal: Soft. Bowel sounds are normal. She exhibits no distension. There is no tenderness. There is no rebound.  Musculoskeletal: She exhibits no edema.  Lymphadenopathy:    She has no cervical adenopathy.  Neurological: She is alert and oriented to person, place, and time. She has normal reflexes. No cranial nerve deficit.  Skin: Skin is warm and  dry.       R 2nd toenail laterally has avulsed due to fungal damage.  Area underneath is raw and oozing serosanguinous fluid but no signs of infxn.  L 4th toe w/ wart or large corn  Fungal infxn of feet and nails bilaterally          Assessment & Plan:

## 2011-01-03 NOTE — Assessment & Plan Note (Signed)
Sample of Advair 250/50 given along w/ instructions on use.  Sample of spiriva given.  Encouraged her to f/u w/ pulm.

## 2011-01-03 NOTE — Assessment & Plan Note (Signed)
No evidence of current infxn but given extensive fungal infxn and current wart/corn will need podiatry appt.  Referral made.

## 2011-01-03 NOTE — Assessment & Plan Note (Signed)
Pt describing sxs of hypoglycemia.  Will check labs.  Encouraged pt to eat regularly to avoid sugar dropping.

## 2011-01-04 ENCOUNTER — Encounter: Payer: Self-pay | Admitting: Family Medicine

## 2011-01-05 ENCOUNTER — Encounter: Payer: Self-pay | Admitting: *Deleted

## 2011-01-05 MED ORDER — FENOFIBRATE 160 MG PO TABS: 160.0000 mg | ORAL_TABLET | Freq: Every day | ORAL | Status: DC

## 2011-01-05 NOTE — Progress Notes (Signed)
Addended by: Lucious Groves I on: 01/05/2011 04:36 PM   Modules accepted: Orders

## 2011-01-07 ENCOUNTER — Encounter: Payer: Self-pay | Admitting: Internal Medicine

## 2011-01-11 ENCOUNTER — Telehealth: Payer: Self-pay | Admitting: Family Medicine

## 2011-01-11 ENCOUNTER — Encounter: Payer: Self-pay | Admitting: *Deleted

## 2011-01-11 MED ORDER — FUROSEMIDE 20 MG PO TABS: 20.0000 mg | ORAL_TABLET | Freq: Two times a day (BID) | ORAL | Status: DC

## 2011-01-11 MED ORDER — FENOFIBRATE 160 MG PO TABS: 160.0000 mg | ORAL_TABLET | Freq: Every day | ORAL | Status: DC

## 2011-01-11 NOTE — Telephone Encounter (Addendum)
Pt notes that fenofibrate is too expensive and is requesting generic. She needs a $4 med because she does not have insurance. Pt notes that her feet are swelling and she forgot to mention it when she was here. Pt would like recommendations. She currently is not on a fluid pill. Please advise.

## 2011-01-11 NOTE — Telephone Encounter (Signed)
Fenofibrate is the generic.  There is no $4 medicine.  Can start Lasix 20mg  prn for edema.  Should limit salt in diet, increase water intake.  Will need BMP done 2-3 weeks after starting med to assess kidney fxn and K+.  #30, 1 refills.

## 2011-01-11 NOTE — Telephone Encounter (Signed)
I have completed MD portion of pt assistance for this med. I called to see if she would be willing to come by the office and complete form (her signature is required). Left message on voicemail to call the office.

## 2011-01-11 NOTE — Telephone Encounter (Signed)
Pt notified and will stop by later this week to complete pt assistance form.

## 2011-01-12 ENCOUNTER — Ambulatory Visit: Payer: Self-pay | Admitting: Adult Health

## 2011-01-12 ENCOUNTER — Ambulatory Visit (INDEPENDENT_AMBULATORY_CARE_PROVIDER_SITE_OTHER): Payer: Medicare Other | Admitting: Adult Health

## 2011-01-12 ENCOUNTER — Ambulatory Visit (INDEPENDENT_AMBULATORY_CARE_PROVIDER_SITE_OTHER)
Admission: RE | Admit: 2011-01-12 | Discharge: 2011-01-12 | Disposition: A | Discharge status: Home / Self Care | Payer: Medicare Other | Source: Ambulatory Visit | Attending: Adult Health | Admitting: Adult Health

## 2011-01-12 ENCOUNTER — Encounter: Payer: Self-pay | Admitting: Adult Health

## 2011-01-12 VITALS — BP 144/84 | HR 76 | Temp 98.1°F | Ht 65.0 in | Wt 215.4 lb

## 2011-01-12 DIAGNOSIS — J441 Chronic obstructive pulmonary disease with (acute) exacerbation: Secondary | ICD-10-CM

## 2011-01-12 DIAGNOSIS — J449 Chronic obstructive pulmonary disease, unspecified: Secondary | ICD-10-CM

## 2011-01-12 MED ORDER — ALBUTEROL SULFATE (2.5 MG/3ML) 0.083% IN NEBU: 2.5000 mg | INHALATION_SOLUTION | RESPIRATORY_TRACT | Status: DC | PRN

## 2011-01-12 MED ORDER — DOXYCYCLINE HYCLATE 100 MG PO CAPS: 100.0000 mg | ORAL_CAPSULE | Freq: Two times a day (BID) | ORAL | Status: DC

## 2011-01-12 MED ORDER — FLUTICASONE-SALMETEROL 250-50 MCG/DOSE IN AEPB: 1.0000 | INHALATION_SPRAY | Freq: Two times a day (BID) | RESPIRATORY_TRACT | Status: DC

## 2011-01-12 MED ORDER — HYDROCODONE-HOMATROPINE 5-1.5 MG/5ML PO SYRP: 5.0000 mL | ORAL_SOLUTION | Freq: Four times a day (QID) | ORAL | Status: AC | PRN

## 2011-01-12 MED ORDER — PREDNISONE 10 MG PO TABS: ORAL_TABLET | ORAL | Status: DC

## 2011-01-12 NOTE — Patient Instructions (Signed)
MOST IMPORTANT IS TO QUIT SMOKING.  Doxycyline 100mg  Twice daily  For 7 days  Delsym  2 tsp Twice daily  For cough  Prednisone taper as directed.  Add Zyrtec 10mg  At bedtime   Add Pepcid 20mg  At bedtime   May use Tessalon Three times a day  As needed  Cough  May use Hydromet 1-2 tsp every 4-6 hr As needed  Cough , may make you sleepy.  Try to avoid coughing or throat clearing. AVOID MINTS follow up Dr. Delford Field  In 4  Weeks and As needed

## 2011-01-12 NOTE — Progress Notes (Signed)
Subjective:    Patient ID: Ashley Woods, female    DOB: June 15, 1946, 65 y.o.   MRN: [id number]  HPI 65 yo WF with cyclic cough and COPD moderate   This pt has been Dx COPD x 43yrs  This pt sees Dr Shelle Iron for OSA and uses bipap at bedtime, no oxygen. This pt has been on bipap and before this cpap for 71yrs. Pt is on bipap 16/14 full face mask  This pt stays tired all the time.  This pt is on advair and second round of steroids. She had been on abx and no better. Then repeated more steroids. The pt is on 250advair. She was on advair 100 stn. The pt is not on spiriva yet. Her mucous stays tan. She is currently on pred 20mg /d x 7 days and just started 3 d ago.  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  She also is on an ACE inhibitor. She has upper airway tightness. She has daily wheezing. She has daily acid symptoms, hoarseness and sore throat. There is no pn drip.   May 06, 2009 at last ov we rx:  Stop Advair  Start Symbicort two puff twice daily with spacer  Start Spiriva daily  Stop enalapril  Start Losartan 100mg  daily  Finish prednisone  Obtain labs, Chest xray and pulmonary function studies today  Stay on omeprazole daily  Reflux diet   June 03, 2009 11:55 AM  The pt is still hoarse, The pt is still coughing but is better. the cough pill helped. tessalon perles help  The cough spells cause dyspnea  The cough is dry still  There is no heartburn >>referred to [facility], cyclical cough regimen  01/12/11 Acute OV Pt presents for work in visit. Complains of increased SOB, wheezing, prod cough w/ dark brown mucus, LE edema x2 weeks .  She is Still smoking . We discussed in depth to quit smoking. Cough is getting worse. Mucus is dark yellow to brown at times.  No recent travel or abx use.      Review of Systems Constitutional:   No  weight loss, night sweats,  Fevers, chills, fatigue, or  lassitude.  HEENT:   No headaches,  Difficulty swallowing,  Tooth/dental problems, or  Sore throat,                No sneezing, itching, ear ache, nasal congestion, post nasal drip,   CV:  No chest pain,  Orthopnea, PND, swelling in lower extremities, anasarca, dizziness, palpitations, syncope.   GI  No heartburn, indigestion, abdominal pain, nausea, vomiting, diarrhea, change in bowel habits, loss of appetite, bloody stools.   Resp:  No hemoptysis   Skin: no rash or lesions.  GU: no dysuria, change in color of urine, no urgency or frequency.  No flank pain, no hematuria   MS:  No joint pain or swelling.  No decreased range of motion.     Psych:  No change in mood or affect. No depression or anxiety.          Objective:   Physical Exam GEN: A/Ox3; pleasant , NAD, obese   HEENT:  [place]/AT,  EACs-clear, TMs-wnl, NOSE-clear, THROAT-clear, no lesions, no postnasal drip or exudate noted.   NECK:  Supple w/ fair ROM; no JVD; normal carotid  impulses w/o bruits; no thyromegaly or nodules palpated; no lymphadenopathy.  RESP  Coarse BS w/ w/o, wheezes/ rales/ or rhonchi.no accessory muscle use, no dullness to percussion  CARD: S1S2 no m/r/g   , no peripheral edema, pulses intact, no cyanosis or clubbing.  GI:   Soft & nt; nml bowel sounds; no organomegaly or masses detected.  Musco: Warm bil, no deformities or joint swelling noted.   Neuro: alert, no focal deficits noted.    Skin: Warm, no lesions or rashes         Assessment & Plan:

## 2011-01-12 NOTE — Assessment & Plan Note (Signed)
Flare   Plan:  MOST IMPORTANT IS TO QUIT SMOKING.  Doxycyline 100mg  Twice daily  For 7 days  Delsym  2 tsp Twice daily  For cough  Prednisone taper as directed.  Add Zyrtec 10mg  At bedtime   Add Pepcid 20mg  At bedtime   May use Tessalon Three times a day  As needed  Cough  May use Hydromet 1-2 tsp every 4-6 hr As needed  Cough , may make you sleepy.  Try to avoid coughing or throat clearing. AVOID MINTS follow up Dr. Delford Field  In 4  Weeks and As needed

## 2011-01-13 NOTE — Telephone Encounter (Signed)
Patient did not pick up Fenofibrate prescription because it was over $60.00---pharmacist suggested Pravochol at $4.00 or Zocor at $18.00---she asked if either of these would work???   Please call her to [facility] uses [facility] [address] [place], [place]   [phone]   Would like 90 day prescription please if possible

## 2011-01-13 NOTE — Telephone Encounter (Signed)
Left message on voicemail to call the office , per Dr. Beverely Low the pt is already on a statin and neither of these meds are appropriate. Fenofibrate will assist with Triglycerides.

## 2011-01-16 ENCOUNTER — Telehealth: Payer: Self-pay | Admitting: Family Medicine

## 2011-01-16 ENCOUNTER — Telehealth: Payer: Self-pay | Admitting: Adult Health

## 2011-01-16 NOTE — Telephone Encounter (Signed)
Pt called again.  Spoke with patent re: cxr results / recs as stated by TP.  Pt verbalized her understanding.

## 2011-01-16 NOTE — Progress Notes (Signed)
Quick Note:  Pt returned call. Advised of cxr results / recs as stated by TP. Pt verbalized her understanding. ______

## 2011-01-16 NOTE — Telephone Encounter (Signed)
Pt.notified

## 2011-01-18 MED ORDER — FENOFIBRATE 160 MG PO TABS: 160.0000 mg | ORAL_TABLET | Freq: Every day | ORAL | Status: DC

## 2011-01-18 NOTE — Telephone Encounter (Signed)
Addended by: Lucious Groves I on: 01/18/2011 08:41 AM   Modules accepted: Orders

## 2011-01-18 NOTE — Telephone Encounter (Signed)
RX was re-printed for pt assistance paperwork. The patient took the previous rx with her.

## 2011-01-31 ENCOUNTER — Other Ambulatory Visit: Payer: Self-pay | Admitting: Family Medicine

## 2011-02-02 NOTE — Telephone Encounter (Signed)
Refills sent

## 2011-02-08 ENCOUNTER — Ambulatory Visit (INDEPENDENT_AMBULATORY_CARE_PROVIDER_SITE_OTHER): Payer: Medicare Other | Admitting: Pulmonary Disease

## 2011-02-08 VITALS — BP 160/78 | HR 82 | Temp 98.2°F | Ht 65.0 in | Wt 216.8 lb

## 2011-02-08 DIAGNOSIS — G4733 Obstructive sleep apnea (adult) (pediatric): Secondary | ICD-10-CM | POA: Insufficient documentation

## 2011-02-08 NOTE — Patient Instructions (Signed)
Will get you a new bipap machine, and use auto mode to recheck pressure needs for completeness.  Will call you with results. Work on weight loss followup with me in one year, but let us know if having problems.

## 2011-02-08 NOTE — Progress Notes (Signed)
  Subjective:    Patient ID: Ashley Woods, female    DOB: 1945-08-15, 65 y.o.   MRN: [id number]  HPI The pt comes in today for f/u of her known osa.  She is wearing bilevel compliantly, but is due for a new machine.  She is having no significant mask issues.  She continues to have disrupted sleep at times due to discomfort, life stressors, etc, and this leads to nonrestorative sleep at times.  She also notes persistent daytime sleepiness despite wearing her bipap, and will need to take naps at times.  Her weight is down since the last visit.    Review of Systems  Constitutional: Negative for fever and unexpected weight change.  HENT: Positive for sore throat. Negative for ear pain, nosebleeds, congestion, rhinorrhea, sneezing, trouble swallowing, dental problem, postnasal drip and sinus pressure.   Eyes: Negative for redness and itching.  Respiratory: Positive for cough, shortness of breath and wheezing. Negative for chest tightness.   Cardiovascular: Positive for leg swelling. Negative for palpitations.  Gastrointestinal: Negative for nausea and vomiting.  Genitourinary: Negative for dysuria.  Musculoskeletal: Negative for joint swelling.  Skin: Negative for rash.  Neurological: Negative for headaches.  Hematological: Bruises/bleeds easily.  Psychiatric/Behavioral: Negative for dysphoric mood. The patient is not nervous/anxious.        Objective:   Physical Exam Obese female in nad No skin breakdown or pressure necrosis from cpap mask LE without edema, no cyanosis noted.  Alert, does not appear sleepy, moves all 4.        Assessment & Plan:

## 2011-02-12 ENCOUNTER — Encounter: Payer: Self-pay | Admitting: Pulmonary Disease

## 2011-02-12 NOTE — Assessment & Plan Note (Signed)
The pt has known osa, and has done well with bilevel in the past.  She now has an aged machine that needs to be replaced, and is having increased symptoms of sleepiness despite wearing bipap.  It is unclear how much is related to her old machine, a change in pressure requirements, or possibly related to her pain and life stressors.  Will get her a new machine, and will also re-optimize her pressure.  I have also encouraged her to work on weight loss.

## 2011-02-22 ENCOUNTER — Ambulatory Visit (INDEPENDENT_AMBULATORY_CARE_PROVIDER_SITE_OTHER): Payer: Medicare Other | Admitting: Family Medicine

## 2011-02-22 ENCOUNTER — Encounter: Payer: Self-pay | Admitting: Family Medicine

## 2011-02-22 DIAGNOSIS — F17200 Nicotine dependence, unspecified, uncomplicated: Secondary | ICD-10-CM

## 2011-02-22 DIAGNOSIS — I10 Essential (primary) hypertension: Secondary | ICD-10-CM

## 2011-02-22 DIAGNOSIS — E785 Hyperlipidemia, unspecified: Secondary | ICD-10-CM

## 2011-02-22 DIAGNOSIS — Z0000 Encounter for general adult medical examination without abnormal findings: Secondary | ICD-10-CM

## 2011-02-22 MED ORDER — LISINOPRIL 10 MG PO TABS: 10.0000 mg | ORAL_TABLET | Freq: Every day | ORAL | Status: DC

## 2011-02-22 MED ORDER — HYDROCODONE-ACETAMINOPHEN 5-500 MG PO TABS: 1.0000 | ORAL_TABLET | Freq: Four times a day (QID) | ORAL | Status: DC | PRN

## 2011-02-22 NOTE — Progress Notes (Signed)
  Subjective:    Patient ID: Ashley Woods, female    DOB: October 06, 1945, 65 y.o.   MRN: [id number]  HPI Here today for CPE.  Risk Factors: HTN- chronic problem for pt, not well controlled today.   Hyperlipidemia- chronic problem for pt, poor control.  Waiting on patient assistance for fenofibrate. Tobacco use- still smoking 1/2 ppd. Physical Activity: mows yard regularly, does outdoor activities, walking regularly Fall risk: low risk Depression: denies current sxs Hearing: hearing aides in place ADL's: independent Cognitive: normal linear thought process, no deficits in memory noted. Home Safety: safe at home Height, Weight, BMI, Visual Acuity: see vitals, vision corrected to 20/20 w/ glasses Counseling: UTD on pap and mammo w/ Dr Nicholas Lose, had colonoscopy 5 yrs ago (overdue) Labs Ordered: See A&P Care Plan: See A&P    Review of Systems Patient reports no vision/ hearing changes, adenopathy, fever, weight change,  persistant/recurrent hoarseness, swallowing issues, chest pain, palpitations, edema, persistant/recurrent cough, hemoptysis, dyspnea (rest/exertional/paroxysmal nocturnal), gastrointestinal bleeding (melena, rectal bleeding), abdominal pain, significant heartburn, bowel changes, GU symptoms (dysuria, hematuria, incontinence), Gyn symptoms (abnormal  bleeding, pain),  syncope, focal weakness, memory loss, numbness & tingling, skin/hair/nail changes, abnormal bruising or bleeding, anxiety, or depression.     Objective:   Physical Exam  General Appearance:    Alert, cooperative, no distress, appears stated age  Head:    Normocephalic, without obvious abnormality, atraumatic  Eyes:    PERRL, conjunctiva/corneas clear, EOM's intact, fundi    benign, both eyes  Ears:    Normal TM's and external ear canals, both ears  Nose:   Nares normal, septum midline, mucosa normal, no drainage    or sinus tenderness  Throat:   Lips, mucosa, and tongue normal; teeth and gums normal  Neck:    Supple, symmetrical, trachea midline, no adenopathy;    Thyroid: no enlargement/tenderness/nodules  Back:     Symmetric, no curvature, ROM normal, no CVA tenderness  Lungs:     Coarse BS diffusely throughout but no wheezing today  Chest Wall:    No tenderness or deformity   Heart:    Regular rate and rhythm, S1 and S2 normal, no murmur, rub   or gallop  Breast Exam:    Deferred to GYN  Abdomen:     Soft, non-tender, bowel sounds active all four quadrants,    no masses, no organomegaly  Genitalia:    Deferred to GYN  Rectal:    Deferred to GYN  Extremities:   Extremities normal, atraumatic, no cyanosis or edema  Pulses:   2+ and symmetric all extremities  Skin:   Skin color, texture, turgor normal, no rashes or lesions  Lymph nodes:   Cervical, supraclavicular, and axillary nodes normal  Neurologic:   CNII-XII intact, normal strength, sensation and reflexes    throughout          Assessment & Plan:

## 2011-02-22 NOTE — Patient Instructions (Signed)
Follow up in 1 month to recheck blood pressure Start the Lisinopril daily Keep up the good work on regular exercise- this will improve your lung function and help w/ weight loss/blood pressure Quit smoking- you can do it! Call with any questions or concerns Hang in there!

## 2011-03-01 ENCOUNTER — Telehealth: Payer: Self-pay | Admitting: Family Medicine

## 2011-03-01 NOTE — Telephone Encounter (Signed)
PER THE [facility], PATIENT CANCELLED HER APPOINTMENT, DID NOT RSC'D.  I LM FOR PATIENT TO CALL ME.

## 2011-03-01 NOTE — Telephone Encounter (Signed)
noted 

## 2011-03-06 ENCOUNTER — Other Ambulatory Visit: Payer: Self-pay | Admitting: Family Medicine

## 2011-03-07 DIAGNOSIS — Z0000 Encounter for general adult medical examination without abnormal findings: Secondary | ICD-10-CM | POA: Insufficient documentation

## 2011-03-07 NOTE — Assessment & Plan Note (Signed)
Not well controlled today.  Asymptomatic.  Will follow closely.

## 2011-03-07 NOTE — Assessment & Plan Note (Signed)
Again encouraged pt to stop smoking!!

## 2011-03-07 NOTE — Assessment & Plan Note (Signed)
Pt still has not started fenofibrate b/c she is waiting on pt assistance.  Stressed the importance.  Will follow.

## 2011-03-07 NOTE — Assessment & Plan Note (Addendum)
Pt's PE WNL.  UTD on health maintenance.  Reviewed recent labs.  Anticipatory guidance provided.  

## 2011-03-29 ENCOUNTER — Ambulatory Visit (INDEPENDENT_AMBULATORY_CARE_PROVIDER_SITE_OTHER): Payer: Medicare Other | Admitting: Family Medicine

## 2011-03-29 ENCOUNTER — Encounter: Payer: Self-pay | Admitting: Family Medicine

## 2011-03-29 VITALS — BP 118/80 | HR 80 | Temp 99.3°F | Wt 215.6 lb

## 2011-03-29 DIAGNOSIS — I10 Essential (primary) hypertension: Secondary | ICD-10-CM

## 2011-03-29 LAB — BASIC METABOLIC PANEL
BUN: 13 mg/dL (ref 6–23)
CO2: 30 mEq/L (ref 19–32)
Chloride: 105 mEq/L (ref 96–112)
GFR: 100.82 mL/min (ref >60.00)
Glucose, Bld: 70 mg/dL (ref 70–99)
Potassium: 3.6 mEq/L (ref 3.5–5.1)
Sodium: 145 mEq/L (ref 135–145)

## 2011-03-29 MED ORDER — HYDROCODONE-ACETAMINOPHEN 5-500 MG PO TABS: 1.0000 | ORAL_TABLET | Freq: Four times a day (QID) | ORAL | Status: DC | PRN

## 2011-03-29 NOTE — Patient Instructions (Signed)
Follow up in 6 months to recheck BP and cholesterol Your blood pressure is great!  Keep up the good work! We'll notify you of your lab results Call with any questions or concerns Happy Labor Day!

## 2011-03-29 NOTE — Assessment & Plan Note (Signed)
BP much better controlled today.  Asymptomatic.  Check BMP.

## 2011-03-29 NOTE — Progress Notes (Signed)
  Subjective:    Patient ID: Ashley Woods, female    DOB: [birth year], 65 y.o.   MRN: [id number]  HPI HTN- pt started Lisinopril last OV.  No problems w/ medicine.  Denies CP, SOB, HAs, visual changes, edema.   Review of Systems For ROS see HPI     Objective:   Physical Exam  Vitals reviewed. Constitutional: She appears well-developed and well-nourished. No distress.  Neck: Neck supple.  Cardiovascular: Normal rate, regular rhythm, normal heart sounds and intact distal pulses.   No murmur heard. Pulmonary/Chest: Effort normal. No respiratory distress. She has wheezes (faint scattered wheezes- improved since last visit). She has no rales.  Musculoskeletal: She exhibits no edema.          Assessment & Plan:

## 2011-03-30 ENCOUNTER — Encounter: Payer: Self-pay | Admitting: *Deleted

## 2011-04-05 ENCOUNTER — Other Ambulatory Visit: Payer: Self-pay | Admitting: Family Medicine

## 2011-04-05 MED ORDER — CITALOPRAM HYDROBROMIDE 20 MG PO TABS: 20.0000 mg | ORAL_TABLET | Freq: Every day | ORAL | Status: DC

## 2011-04-05 NOTE — Telephone Encounter (Signed)
Ok for #30, 6 refills 

## 2011-04-05 NOTE — Telephone Encounter (Signed)
Last ov 03/29/11. Last filled 03/08/11

## 2011-04-05 NOTE — Telephone Encounter (Signed)
Done

## 2011-04-08 ENCOUNTER — Other Ambulatory Visit: Payer: Self-pay | Admitting: Pulmonary Disease

## 2011-04-08 DIAGNOSIS — G4733 Obstructive sleep apnea (adult) (pediatric): Secondary | ICD-10-CM

## 2011-04-28 ENCOUNTER — Telehealth: Payer: Self-pay | Admitting: Family Medicine

## 2011-04-28 NOTE — Telephone Encounter (Signed)
Tried calling patient back, no ans and no ans [address]

## 2011-04-28 NOTE — Telephone Encounter (Signed)
Patient is having blood in urine, can an Rx be called in?

## 2011-04-28 NOTE — Telephone Encounter (Signed)
Called again & husband answered, left message for patient to call back

## 2011-06-06 ENCOUNTER — Other Ambulatory Visit: Payer: Self-pay | Admitting: Family Medicine

## 2011-06-06 MED ORDER — OXYBUTYNIN CHLORIDE 5 MG PO TABS: 5.0000 mg | ORAL_TABLET | Freq: Two times a day (BID) | ORAL | Status: DC

## 2011-06-06 MED ORDER — PRAVASTATIN SODIUM 40 MG PO TABS: 40.0000 mg | ORAL_TABLET | Freq: Every day | ORAL | Status: DC

## 2011-06-06 NOTE — Telephone Encounter (Signed)
rx sent to pharmacy by e-script For pravastatin and oxybutnin

## 2011-06-12 ENCOUNTER — Other Ambulatory Visit: Payer: Self-pay | Admitting: Family Medicine

## 2011-06-12 MED ORDER — OXYBUTYNIN CHLORIDE 5 MG PO TABS: 5.0000 mg | ORAL_TABLET | Freq: Two times a day (BID) | ORAL | Status: DC

## 2011-06-12 MED ORDER — PRAVASTATIN SODIUM 40 MG PO TABS: 40.0000 mg | ORAL_TABLET | Freq: Every day | ORAL | Status: DC

## 2011-06-12 NOTE — Telephone Encounter (Signed)
rx sent to pharmacy by e-script  

## 2011-07-13 ENCOUNTER — Telehealth: Payer: Self-pay | Admitting: Family Medicine

## 2011-07-13 NOTE — Telephone Encounter (Signed)
Patient wants refill hydrocodone - patient will patient will pick up Friday [id number]

## 2011-07-13 NOTE — Telephone Encounter (Signed)
Last OV 03-29-11 last refill 03-29-11

## 2011-07-13 NOTE — Telephone Encounter (Signed)
Ok for refill x 1 

## 2011-07-14 MED ORDER — HYDROCODONE-ACETAMINOPHEN 5-500 MG PO TABS: 1.0000 | ORAL_TABLET | Freq: Four times a day (QID) | ORAL | Status: DC | PRN

## 2011-07-14 NOTE — Telephone Encounter (Signed)
rx up front for pick up 

## 2011-07-19 ENCOUNTER — Telehealth: Payer: Self-pay | Admitting: *Deleted

## 2011-07-19 NOTE — Telephone Encounter (Signed)
Pt came into office to ask if MD Tabori received the report from MD Lomax per he noted extended liver concern/function, per advised that she needs to be checked possbily py her MD and/or specialists per can be a big concern, MD tabori looked over notes in chart from Lomax visit and advised to pt that her liver is noted as fatty.enlarged however her labs noted on 6-12 noted no concern per tabori reviewed, advised pt of verbal instructions as well as per Beverely Low she can still schedule appt to talk with MD about this issue per pt walked in, pt advised that she was ok with the instructions given and does not want an appt at this time per has one 6 months from now, advised if she has any concerns to please call in, pt understood

## 2011-08-01 HISTORY — PX: POLYPECTOMY: SHX149

## 2011-08-01 HISTORY — PX: COLONOSCOPY: SHX174

## 2011-09-06 ENCOUNTER — Ambulatory Visit (INDEPENDENT_AMBULATORY_CARE_PROVIDER_SITE_OTHER): Payer: Medicare Other | Admitting: Family Medicine

## 2011-09-06 ENCOUNTER — Encounter: Payer: Self-pay | Admitting: Family Medicine

## 2011-09-06 DIAGNOSIS — J329 Chronic sinusitis, unspecified: Secondary | ICD-10-CM

## 2011-09-06 DIAGNOSIS — J441 Chronic obstructive pulmonary disease with (acute) exacerbation: Secondary | ICD-10-CM

## 2011-09-06 MED ORDER — ALBUTEROL SULFATE (5 MG/ML) 0.5% IN NEBU
2.5000 mg | INHALATION_SOLUTION | Freq: Once | RESPIRATORY_TRACT | Status: AC
  Administered 2011-09-06: 2.5 mg via RESPIRATORY_TRACT

## 2011-09-06 MED ORDER — IPRATROPIUM BROMIDE 0.02 % IN SOLN
0.5000 mg | Freq: Once | RESPIRATORY_TRACT | Status: AC
  Administered 2011-09-06: 0.5 mg via RESPIRATORY_TRACT

## 2011-09-06 MED ORDER — DOXYCYCLINE HYCLATE 100 MG PO TABS: 100.0000 mg | ORAL_TABLET | Freq: Two times a day (BID) | ORAL | Status: AC

## 2011-09-06 MED ORDER — BENZONATATE 100 MG PO CAPS: 200.0000 mg | ORAL_CAPSULE | ORAL | Status: DC | PRN

## 2011-09-06 MED ORDER — ALBUTEROL SULFATE (2.5 MG/3ML) 0.083% IN NEBU: 2.5000 mg | INHALATION_SOLUTION | RESPIRATORY_TRACT | Status: DC | PRN

## 2011-09-06 MED ORDER — GUAIFENESIN-CODEINE 100-10 MG/5ML PO SYRP: 10.0000 mL | ORAL_SOLUTION | Freq: Three times a day (TID) | ORAL | Status: DC | PRN

## 2011-09-06 NOTE — Assessment & Plan Note (Signed)
Pt's sxs and PE consistent w/ infxn.  Start abx.  Reviewed supportive care and red flags that should prompt return.  Pt expressed understanding and is in agreement w/ plan.  

## 2011-09-06 NOTE — Progress Notes (Signed)
  Subjective:    Patient ID: Ashley Woods, female    DOB: 15-Mar-1946, 66 y.o.   MRN: [id number]  HPI Acute COPD exacerbation- sxs started 1 month ago.  Has been using neb, inhalers (Advair and Spiriva), using cough meds at home.  Increased wheezing, SOB.  Cough is wet but not productive.  Sinusitis- yesterday blew nose and 'it was green'.  + facial pain/pressure.  Alternating chills/sweats, fatigue.  No known fevers.  No ear pain.  + cough.  sxs started 1 month ago.   Review of Systems For ROS see HPI     Objective:   Physical Exam  Constitutional: She appears well-developed and well-nourished. No distress.  HENT:  Head: Normocephalic and atraumatic.  Right Ear: Tympanic membrane normal.  Left Ear: Tympanic membrane normal.  Nose: Mucosal edema and rhinorrhea present. Right sinus exhibits maxillary sinus tenderness and frontal sinus tenderness. Left sinus exhibits maxillary sinus tenderness and frontal sinus tenderness.  Mouth/Throat: Uvula is midline and mucous membranes are normal. Posterior oropharyngeal erythema present. No oropharyngeal exudate.  Eyes: Conjunctivae and EOM are normal. Pupils are equal, round, and reactive to light.  Neck: Normal range of motion. Neck supple.  Cardiovascular: Normal rate, regular rhythm and normal heart sounds.   Pulmonary/Chest: Effort normal. No respiratory distress. She has wheezes. She has rales.       Wet cough  Lymphadenopathy:    She has no cervical adenopathy.          Assessment & Plan:

## 2011-09-06 NOTE — Assessment & Plan Note (Signed)
Pt w/ recurrent exacerbation.  Air movement improved w/ neb tx.  Start abx.  Cough meds prn.  Reviewed supportive care and red flags that should prompt return.  Pt expressed understanding and is in agreement w/ plan.

## 2011-09-06 NOTE — Patient Instructions (Signed)
This is a sinus infection and bronchitis Start the Doxy twice daily w/ food Use the cough syrup as needed Drink plenty of fluids Continue the Advair, Spiriva, and albuterol as needed Call with any questions or concerns Hang in there!!!

## 2011-09-27 ENCOUNTER — Telehealth: Payer: Self-pay | Admitting: Family Medicine

## 2011-09-27 NOTE — Telephone Encounter (Signed)
Last OV 09-06-11, last filled 07-14-11 #60

## 2011-09-27 NOTE — Telephone Encounter (Signed)
Ok for #60 

## 2011-09-27 NOTE — Telephone Encounter (Signed)
Patient states she needs new rx for hydrocodone. Would like to come pick up on Friday. Call when ready.

## 2011-09-28 MED ORDER — HYDROCODONE-ACETAMINOPHEN 5-500 MG PO TABS: 1.0000 | ORAL_TABLET | Freq: Four times a day (QID) | ORAL | Status: DC | PRN

## 2011-09-28 NOTE — Telephone Encounter (Signed)
.  rx faxed to pharmacy, manually.  

## 2011-10-11 ENCOUNTER — Telehealth: Payer: Self-pay | Admitting: Family Medicine

## 2011-10-11 MED ORDER — LISINOPRIL 10 MG PO TABS: 10.0000 mg | ORAL_TABLET | Freq: Every day | ORAL | Status: DC

## 2011-10-11 NOTE — Telephone Encounter (Signed)
Refill: Lisinopril 10mg  tab #30. Take 1 tablet by mouth every day. Last fill 09-08-11

## 2011-10-11 NOTE — Telephone Encounter (Signed)
rx sent to pharmacy by e-script  

## 2011-11-14 ENCOUNTER — Telehealth: Payer: Self-pay | Admitting: Family Medicine

## 2011-11-14 NOTE — Telephone Encounter (Signed)
Refill: Vicodin 5-500mg  tab. Take 1 tablet by mouth every 6 hours as needed for pain. Qty 60. Last fill 09-28-11  Celexa 20mg  tab. Take 1 tablet by mouth every day. Qty 30. Last fill 10-10-11

## 2011-11-14 NOTE — Telephone Encounter (Signed)
Last OV 09-06-11 last refill for vicodin 09-28-11 #60 no refills,celexa refill 04-05-11 #30 with 6 refills

## 2011-11-15 MED ORDER — HYDROCODONE-ACETAMINOPHEN 5-500 MG PO TABS: 1.0000 | ORAL_TABLET | Freq: Four times a day (QID) | ORAL | Status: DC | PRN

## 2011-11-15 MED ORDER — CITALOPRAM HYDROBROMIDE 20 MG PO TABS: 20.0000 mg | ORAL_TABLET | Freq: Every day | ORAL | Status: DC

## 2011-11-15 NOTE — Telephone Encounter (Signed)
rx sent to pharmacy by e-script for celexa vicodin manually faxed

## 2011-11-15 NOTE — Telephone Encounter (Signed)
Ok for celexa #30, 6 refills Vicodin, #60, no refills

## 2012-01-16 ENCOUNTER — Ambulatory Visit (INDEPENDENT_AMBULATORY_CARE_PROVIDER_SITE_OTHER): Payer: Medicare Other | Admitting: Pulmonary Disease

## 2012-01-16 ENCOUNTER — Encounter: Payer: Self-pay | Admitting: Pulmonary Disease

## 2012-01-16 VITALS — BP 150/90 | HR 70 | Temp 98.2°F | Ht 64.0 in | Wt 213.0 lb

## 2012-01-16 DIAGNOSIS — G4733 Obstructive sleep apnea (adult) (pediatric): Secondary | ICD-10-CM

## 2012-01-16 NOTE — Assessment & Plan Note (Signed)
The patient is wearing bilevel compliantly at optimal pressure, but continues to have some daytime sleepiness for completely different reasons.  She is due for a new machine, and will order this through her DME.  I have encouraged her to work aggressively on weight loss, and to followup with me in one year if doing well.

## 2012-01-16 NOTE — Patient Instructions (Addendum)
Will get you referred for a new bipap machine, and keep on same pressure. Work on weight loss followup with me in one year.

## 2012-01-16 NOTE — Progress Notes (Signed)
  Subjective:    Patient ID: Ashley Woods, female    DOB: 12/30/1945, 66 y.o.   MRN: [id number]  HPI The patient is in today for followup of her known obstructive sleep apnea.  She is wearing bilevel compliantly, and is having no issues with her mask fit.  She feels that she sleeps as well as can be expected given her other issues, but continues to have some degree of daytime sleepiness.  Her pressure was optimized last year to 17/14.  We did order her a new bilevel machine last year, but she was not eligible and told this year according to Medicare guidelines.  She has been using a refurbished machine.   Review of Systems  Constitutional: Negative for fever and unexpected weight change.  HENT: Negative for ear pain, nosebleeds, congestion, sore throat, rhinorrhea, sneezing, trouble swallowing, dental problem, postnasal drip and sinus pressure.   Eyes: Negative for redness and itching.  Respiratory: Positive for cough, shortness of breath and wheezing. Negative for chest tightness.   Cardiovascular: Negative for palpitations and leg swelling.  Gastrointestinal: Negative for nausea and vomiting.  Genitourinary: Negative for dysuria.  Musculoskeletal: Negative for joint swelling.  Skin: Negative for rash.  Neurological: Negative for headaches.  Hematological: Does not bruise/bleed easily.  Psychiatric/Behavioral: Negative for dysphoric mood. The patient is not nervous/anxious.        Objective:   Physical Exam Obese female in no acute distress No skin breakdown or pressure necrosis from the CPAP mask Lower extremities with minimal edema, no cyanosis Alert, does not appear to be sleepy, moves all 4 extremities.       Assessment & Plan:

## 2012-01-23 ENCOUNTER — Other Ambulatory Visit: Payer: Self-pay | Admitting: Family Medicine

## 2012-01-23 NOTE — Telephone Encounter (Signed)
Per call from patient please send refill for vicodin to [facility]  Last fill 4.1713, last ov 2.6.13  Last refill shows Hydrocodone-Acetaminophen (Tab) VICODIN 5-500 Qty 60 Directions Take 1 tablet by mouth every 6 (six) hours as needed for pain.  [facility] pharmacy on file: [id number] - [place], [place] - [address]

## 2012-01-23 NOTE — Telephone Encounter (Signed)
Last OV 09-06-11 last refill 11-15-11 #60 no refills

## 2012-01-23 NOTE — Telephone Encounter (Signed)
Ok for #60, no refills 

## 2012-01-24 MED ORDER — HYDROCODONE-ACETAMINOPHEN 5-500 MG PO TABS: 1.0000 | ORAL_TABLET | Freq: Four times a day (QID) | ORAL | Status: DC | PRN

## 2012-01-24 NOTE — Telephone Encounter (Signed)
.  rx faxed to pharmacy, manually to [facility] on [address]

## 2012-02-19 ENCOUNTER — Telehealth: Payer: Self-pay | Admitting: Family Medicine

## 2012-02-19 MED ORDER — PRAVASTATIN SODIUM 40 MG PO TABS: 40.0000 mg | ORAL_TABLET | Freq: Every day | ORAL | Status: DC

## 2012-02-19 MED ORDER — OXYBUTYNIN CHLORIDE 5 MG PO TABS: 5.0000 mg | ORAL_TABLET | Freq: Two times a day (BID) | ORAL | Status: DC

## 2012-02-19 NOTE — Telephone Encounter (Signed)
rx sent to pharmacy by e-script for 30 day supply per pt was advised on 03-29-11 to schedule a 6 month follow up OV to check BP and cholesterol, pt has not been seen in OV since 09-05-10, last labs noted 03-2011, Letter has been mailed to pt address noted in the chart to advise they are overdue for cpe/ov/labs and the pt needs to contact office to set up appt for CPE per due for CPE after 02-22-12 with fasting labs

## 2012-02-19 NOTE — Telephone Encounter (Signed)
Refill: Ditropan 5mg  tab. Take one tablet by mouth twice daily. Qty 30. Last fill 02-16-12  Pravastatin 40mg  tab. Take one tablet by mouth every day. Qty 30. Last fill 02-16-12

## 2012-02-20 ENCOUNTER — Telehealth: Payer: Self-pay | Admitting: *Deleted

## 2012-02-20 MED ORDER — OXYBUTYNIN CHLORIDE 5 MG PO TABS: ORAL_TABLET | ORAL | Status: DC

## 2012-02-20 NOTE — Telephone Encounter (Signed)
Received incoming fax to clarify sig for the ditropan 5mg  re-sent to advise TAKE ONE-HALF TABLET BY MOUTH TWICE DAILY Via escribe

## 2012-03-11 ENCOUNTER — Other Ambulatory Visit: Payer: Self-pay | Admitting: Family Medicine

## 2012-03-11 MED ORDER — HYDROCODONE-ACETAMINOPHEN 5-500 MG PO TABS: 1.0000 | ORAL_TABLET | Freq: Four times a day (QID) | ORAL | Status: DC | PRN

## 2012-03-11 NOTE — Telephone Encounter (Signed)
Ok for #60, no refills 

## 2012-03-11 NOTE — Telephone Encounter (Signed)
Last OV 09-06-11 has upcoming apt noted on 05-08-12 last refill 01-24-12 #60 with no refills

## 2012-03-11 NOTE — Telephone Encounter (Signed)
REFILL Hydrocodone-Acetaminophen (Tab) VICODIN 5-500 MG Take 1 tablet by mouth every 6 (six) hours as needed for pain. CALL WHEN READY FOR PICK UP  cpe Schd. For 10.9.13 CB [phone]

## 2012-03-11 NOTE — Telephone Encounter (Signed)
.  rx faxed to pharmacy, manually, left pt vm to advise RX has been faxed

## 2012-03-24 ENCOUNTER — Other Ambulatory Visit: Payer: Self-pay | Admitting: Family Medicine

## 2012-03-25 NOTE — Telephone Encounter (Signed)
rx sent to pharmacy by e-script  

## 2012-04-02 ENCOUNTER — Encounter: Payer: Self-pay | Admitting: Internal Medicine

## 2012-04-02 ENCOUNTER — Ambulatory Visit (INDEPENDENT_AMBULATORY_CARE_PROVIDER_SITE_OTHER): Payer: Medicare Other | Admitting: Internal Medicine

## 2012-04-02 VITALS — BP 148/90 | HR 78 | Temp 98.4°F | Wt 215.0 lb

## 2012-04-02 DIAGNOSIS — R109 Unspecified abdominal pain: Secondary | ICD-10-CM

## 2012-04-02 DIAGNOSIS — R319 Hematuria, unspecified: Secondary | ICD-10-CM

## 2012-04-02 DIAGNOSIS — F329 Major depressive disorder, single episode, unspecified: Secondary | ICD-10-CM

## 2012-04-02 DIAGNOSIS — R1030 Lower abdominal pain, unspecified: Secondary | ICD-10-CM | POA: Insufficient documentation

## 2012-04-02 DIAGNOSIS — F3289 Other specified depressive episodes: Secondary | ICD-10-CM

## 2012-04-02 DIAGNOSIS — J441 Chronic obstructive pulmonary disease with (acute) exacerbation: Secondary | ICD-10-CM

## 2012-04-02 DIAGNOSIS — F32A Depression, unspecified: Secondary | ICD-10-CM

## 2012-04-02 LAB — POCT URINALYSIS DIPSTICK
Bilirubin, UA: NEGATIVE
Blood, UA: NEGATIVE
Glucose, UA: NEGATIVE
Ketones, UA: NEGATIVE
Leukocytes, UA: NEGATIVE
Nitrite, UA: 0.2
pH, UA: 6

## 2012-04-02 MED ORDER — DOXYCYCLINE HYCLATE 100 MG PO TABS: 100.0000 mg | ORAL_TABLET | Freq: Two times a day (BID) | ORAL | Status: DC

## 2012-04-02 NOTE — Assessment & Plan Note (Signed)
Mild COPD exacerbation, will treat with doxycycline, see instructions

## 2012-04-02 NOTE — Assessment & Plan Note (Signed)
I'm somehow concerned about her lower abdominal tenderness (early appendicitis? Diverticulitis?) However she recently had gross hematuria, GI symptoms are at baseline (has IBS and frequent diarrhea).  Udip today (-), she may have passed a stone. Plan: Check a urine culture, patient knows to call me if she has increased abdominal pain, fever, increased appetite, increased GI symptoms

## 2012-04-02 NOTE — Patient Instructions (Addendum)
Rest, fluids , tylenol For cough, take Mucinex DM twice a day as needed  For chest congestion continue Advair and use the nebulization 4 times a day as needed Take the antibiotic as prescribed  (doxy) Call if no better in few days Call anytime if the symptoms are severe  If the lower stomach discomfort gets worse, you have fever chills, constipation, feel bloated: Definitely call us and let us know. You will   probably need a CT ---- for difficulty sleeping, take clonazepam 3 tablets at night, please schedule an appointment to see Dr. Beverely Low if not better

## 2012-04-02 NOTE — Assessment & Plan Note (Signed)
History of depression, also some difficulty sleeping despite taking clonazepam 2 tablets at bedtime. Plan: Increase to  3 tablets at bedtime and discuss further with PCP

## 2012-04-02 NOTE — Progress Notes (Signed)
  Subjective:    Patient ID: Ashley Woods, female    DOB: 1945/11/02, 66 y.o.   MRN: [id number]  HPI Acute visit, we discussed the following issues: Respiratory symptoms above baseline for the last for 5 days: More cough and wheezing than usual. Cough is dry, denies hemoptysis. UTI? Over the weekend, had gross hematuria "clots of blood in the urine" at least twice, some dysuria and lower abdominal discomfort. Denies any vaginal discharge  Insomnia, not sleeping completely well despite taking clonazepam 2 tablets at night.  Past Medical History  Diagnosis Date  . Hypertension   . Hyperlipidemia   . GERD (gastroesophageal reflux disease)   . Colon polyps   . IBS (irritable bowel syndrome)   . Overactive bladder   . Depression   . Asthma   . Sleep apnea   . Rosacea   . Macular degeneration     Dr. Jackquline Bosch  . COPD (chronic obstructive pulmonary disease)   . Diverticulosis   . Hemorrhoids    Past Surgical History  Procedure Date  . Shoulder surgery   . Hernia repair   . Cervical fusion   . Partial hysterectomy   . Oophorectomy   . Tonsillectomy     Review of Systems Had subjective fever and felt cold during the weekend.  No nausea or vomiting. Denies constipation, has diarrhea "as usual" When asked about flank pain, she has chronic back pain and is at baseline.     Objective:   Physical Exam General -- alert, well-developed, and overweight appearing. No apparent distress. VSS, BP slightly elevated Neck --no LADs HEENT -- TMs normal, throat w/o redness, face symmetric and not tender to palpation, nose not congested  Lungs --no respiratory distress, few rhonchi, bilateral wheezing.Marland Kitchen   Heart-- normal rate, regular rhythm, no murmur, and no gallop.   Abdomen--not distended, soft, tender at the lower abdomen bilaterally, slightly worse on the right side?. Good bowel sounds, no mass, rebound or guarding. No CVA tenderness Extremities-- no pretibial edema bilaterally   Neurologic-- alert & oriented X3 and strength normal in all extremities. Psych-- Cognition and judgment appear intact. Alert and cooperative with normal attention span and concentration.  not anxious appearing and not depressed appearing.       Assessment & Plan:

## 2012-04-03 ENCOUNTER — Encounter: Payer: Self-pay | Admitting: Internal Medicine

## 2012-04-03 ENCOUNTER — Telehealth: Payer: Self-pay | Admitting: *Deleted

## 2012-04-03 MED ORDER — ALBUTEROL SULFATE (2.5 MG/3ML) 0.083% IN NEBU: 2.5000 mg | INHALATION_SOLUTION | RESPIRATORY_TRACT | Status: DC | PRN

## 2012-04-03 NOTE — Telephone Encounter (Signed)
Pt called in for refill for nebulizer medication per noted OV with MD Paz yesterday and was advised to start treatments again, pt verified pharmacy, noted the instructions are noted as dispense as individuals nebules, sent via escribe

## 2012-04-05 ENCOUNTER — Telehealth: Payer: Self-pay | Admitting: *Deleted

## 2012-04-05 LAB — URINE CULTURE

## 2012-04-05 NOTE — Telephone Encounter (Signed)
Spoke with pt & she states that she is still having discomfort in her lower abdomen. She also is having a colonoscopy done by Dr. Marina Goodell on October 16. Pt did say that she does have mesh in her stomach & would like to know if they could be causing the pain. Please advise.

## 2012-04-08 NOTE — Telephone Encounter (Signed)
As far as the mesh causing discomfort , that would be a question for her surgeon. If the pain is not any better, please arrange a CT abdomen-pelvis w/ contrast Will need a CBC  BMP before CT

## 2012-04-08 NOTE — Telephone Encounter (Signed)
Left msg for pt to return call.

## 2012-04-08 NOTE — Telephone Encounter (Signed)
Discussed with pt. She states she is still not feeling any better & will call back tomorrow to let us know if she wants to have the CT scan done.

## 2012-04-25 ENCOUNTER — Encounter: Payer: Self-pay | Admitting: Internal Medicine

## 2012-05-01 ENCOUNTER — Ambulatory Visit (AMBULATORY_SURGERY_CENTER): Payer: Medicare Other | Admitting: *Deleted

## 2012-05-01 ENCOUNTER — Telehealth: Payer: Self-pay | Admitting: *Deleted

## 2012-05-01 VITALS — Ht 65.0 in | Wt 218.4 lb

## 2012-05-01 DIAGNOSIS — Z1211 Encounter for screening for malignant neoplasm of colon: Secondary | ICD-10-CM

## 2012-05-01 NOTE — Telephone Encounter (Signed)
Dr. Marina Goodell: Pt is scheduled for recall colonoscopy 10/16.  Hx tubular adenoma 2007.  She has several concerns while she is in PV: She is complaining of dull RLQ pain for 2 to 3 months.  No nausea, no rectal bleeding.  She has history of IBS.  She has diarrhea stools that have increased from several times a day to 3 to 6 times a day. Does she need an office visit? Her other concern is about the prep: Her husband had a colonoscopy several weeks ago using MoviPrep.  She says she smelled the prep and gagged so she does not think she can use MoviPrep.  What would you like for her to use for prep if she is to proceed with colonoscopy as planned? Thanks, Olegario Messier

## 2012-05-02 NOTE — Telephone Encounter (Signed)
Pt notified that colonoscopy was cancelled and new pt appointment scheduled with Dr. Marina Goodell on 05/27/2012

## 2012-05-02 NOTE — Telephone Encounter (Signed)
Left message for pt to call back to schedule office visit with Dr. Marina Goodell.

## 2012-05-02 NOTE — Telephone Encounter (Signed)
Have her come see me in the office to evaluate all of these issues and discuss prep options (she should remind me). Thanks

## 2012-05-08 ENCOUNTER — Telehealth: Payer: Self-pay | Admitting: *Deleted

## 2012-05-08 ENCOUNTER — Ambulatory Visit (INDEPENDENT_AMBULATORY_CARE_PROVIDER_SITE_OTHER): Payer: Medicare Other | Admitting: Family Medicine

## 2012-05-08 ENCOUNTER — Encounter: Payer: Self-pay | Admitting: Family Medicine

## 2012-05-08 VITALS — BP 128/90 | HR 83 | Temp 98.1°F | Ht 63.75 in | Wt 219.8 lb

## 2012-05-08 DIAGNOSIS — Z0000 Encounter for general adult medical examination without abnormal findings: Secondary | ICD-10-CM

## 2012-05-08 DIAGNOSIS — E785 Hyperlipidemia, unspecified: Secondary | ICD-10-CM

## 2012-05-08 DIAGNOSIS — Z23 Encounter for immunization: Secondary | ICD-10-CM

## 2012-05-08 DIAGNOSIS — F3289 Other specified depressive episodes: Secondary | ICD-10-CM

## 2012-05-08 DIAGNOSIS — E538 Deficiency of other specified B group vitamins: Secondary | ICD-10-CM

## 2012-05-08 DIAGNOSIS — E559 Vitamin D deficiency, unspecified: Secondary | ICD-10-CM

## 2012-05-08 DIAGNOSIS — G4700 Insomnia, unspecified: Secondary | ICD-10-CM

## 2012-05-08 DIAGNOSIS — F329 Major depressive disorder, single episode, unspecified: Secondary | ICD-10-CM

## 2012-05-08 DIAGNOSIS — I10 Essential (primary) hypertension: Secondary | ICD-10-CM

## 2012-05-08 LAB — HEPATIC FUNCTION PANEL
ALT: 23 U/L (ref 0–35)
AST: 22 U/L (ref 0–37)
Albumin: 4.2 g/dL (ref 3.5–5.2)
Alkaline Phosphatase: 60 U/L (ref 39–117)
Total Protein: 7.1 g/dL (ref 6.0–8.3)

## 2012-05-08 LAB — BASIC METABOLIC PANEL
BUN: 13 mg/dL (ref 6–23)
CO2: 27 mEq/L (ref 19–32)
Chloride: 106 mEq/L (ref 96–112)
Glucose, Bld: 95 mg/dL (ref 70–99)
Potassium: 4.7 mEq/L (ref 3.5–5.1)
Sodium: 141 mEq/L (ref 135–145)

## 2012-05-08 LAB — CBC WITH DIFFERENTIAL/PLATELET
Basophils Relative: 0.5 % (ref 0.0–3.0)
Eosinophils Relative: 4.7 % (ref 0.0–5.0)
MCV: 96.9 fl (ref 78.0–100.0)
Monocytes Absolute: 0.4 10*3/uL (ref 0.1–1.0)
Monocytes Relative: 7.4 % (ref 3.0–12.0)
Neutrophils Relative %: 51.3 % (ref 43.0–77.0)
Platelets: 192 10*3/uL (ref 150.0–400.0)
RBC: 4.97 Mil/uL (ref 3.87–5.11)
WBC: 5.1 10*3/uL (ref 4.5–10.5)

## 2012-05-08 LAB — LIPID PANEL: Cholesterol: 240 mg/dL — ABNORMAL HIGH (ref 0–200)

## 2012-05-08 LAB — TSH: TSH: 3.83 u[IU]/mL (ref 0.35–5.50)

## 2012-05-08 LAB — VITAMIN B12: Vitamin B-12: 212 pg/mL (ref 211–911)

## 2012-05-08 MED ORDER — OXYBUTYNIN CHLORIDE ER 10 MG PO TB24: 10.0000 mg | ORAL_TABLET | Freq: Every day | ORAL | Status: DC

## 2012-05-08 MED ORDER — CITALOPRAM HYDROBROMIDE 40 MG PO TABS: 40.0000 mg | ORAL_TABLET | Freq: Every day | ORAL | Status: DC

## 2012-05-08 MED ORDER — CLONAZEPAM 0.5 MG PO TABS: 0.7500 mg | ORAL_TABLET | Freq: Every evening | ORAL | Status: DC | PRN

## 2012-05-08 MED ORDER — HYDROCODONE-ACETAMINOPHEN 5-500 MG PO TABS: 1.0000 | ORAL_TABLET | Freq: Four times a day (QID) | ORAL | Status: DC | PRN

## 2012-05-08 NOTE — Patient Instructions (Addendum)
Follow up in 2 months to recheck mood/sleep Increase the citalopram to 40mg - 2 of what you have, 1 of the new script Continue the Klonopin as needed for sleep Start the Oxybutinin 10mg  daily We'll notify you of your lab results and make any changes if needed Call with any questions or concerns Hang in there!!!

## 2012-05-08 NOTE — Progress Notes (Signed)
  Subjective:    Patient ID: Ashley Woods, female    DOB: Mar 19, 1946, 66 y.o.   MRN: [id number]  HPI Here today for CPE.  Risk Factors: HTN- chronic problem, adequate control (has not taken meds yet today).  Denies CP, SOB, HAs, visual changes. Hyperlipidemia- chronic problem, on pravachol.  Denies abd pain, N/V, myalgias. Insomnia- chronic problem, on Klonopin 2 tabs nightly but still having problem.  Was told to take 3 sleeping meds- pt not comfortable w/ that.  Pharmacist told her not to do this due to OSA- 'it could kill me!'.  Wearing CPAP nightly.  Wants to know what to do from here. Physical Activity: no regular exercise Fall Risk: low risk, steady on feet Depression: chronic problem, on celexa Hearing: decreased to whispered voice, normal to conversational tones when wearing hearing aides ADL's: independent Cognitive: normal linear thought process, memory and attention intact Home Safety: safe at home, lives w/ husband Height, Weight, BMI, Visual Acuity: see vitals, vision corrected to 20/20 w/ glasses Counseling: has upcoming appt w/ Dr Marina Goodell, overdue on mammo and DEXA (has GYN).  Due for flu and PNA shot Labs Ordered: See A&P Care Plan: See A&P    Review of Systems Patient reports no vision/ hearing changes, adenopathy,fever, weight change,  persistant/recurrent hoarseness , swallowing issues, chest pain, palpitations, edema, hemoptysis, dyspnea (rest/exertional/paroxysmal nocturnal), gastrointestinal bleeding (melena, rectal bleeding), significant heartburn, bowel changes, GU symptoms (dysuria, hematuria, incontinence), Gyn symptoms (abnormal  bleeding, pain),  syncope, focal weakness, memory loss, numbness & tingling, skin/hair/nail changes, abnormal bruising or bleeding.   + chronic cough + abd pain- has upcoming appt w/ Dr Marina Goodell     Objective:   Physical Exam General Appearance:    Alert, cooperative, no distress, appears stated age, obese  Head:    Normocephalic,  without obvious abnormality, atraumatic  Eyes:    PERRL, conjunctiva/corneas clear, EOM's intact, fundi    benign, both eyes  Ears:    Normal TM's and external ear canals, both ears  Nose:   Nares normal, septum midline, mucosa normal, no drainage    or sinus tenderness  Throat:   Lips, mucosa, and tongue normal; teeth and gums normal  Neck:   Supple, symmetrical, trachea midline, no adenopathy;    Thyroid: no enlargement/tenderness/nodules  Back:     Symmetric, no curvature, ROM normal, no CVA tenderness  Lungs:     Clear to auscultation bilaterally, respirations unlabored  Chest Wall:    No tenderness or deformity   Heart:    Regular rate and rhythm, S1 and S2 normal, no murmur, rub   or gallop  Breast Exam:    Deferred to GYN  Abdomen:     Soft, non-tender, bowel sounds active all four quadrants,    no masses, no organomegaly  Genitalia:    Deferred to GYN  Rectal:    Extremities:   Extremities normal, atraumatic, no cyanosis or edema  Pulses:   2+ and symmetric all extremities  Skin:   Skin color, texture, turgor normal, no rashes or lesions  Lymph nodes:   Cervical, supraclavicular, and axillary nodes normal  Neurologic:   CNII-XII intact, normal strength, sensation and reflexes    throughout          Assessment & Plan:

## 2012-05-08 NOTE — Telephone Encounter (Signed)
Ok to continue 1.5 tabs nightly.  #45, 3 refills

## 2012-05-08 NOTE — Telephone Encounter (Signed)
Please advise about dosage for pt, noted in system as historical provider however bottle noted MD Tabori, directions on bottle note 0.5mg  of klonopin take one and a half tabs at night for sleep, pt noted MD Beverely Low was to change either dosage of number of pills, please advise OV today

## 2012-05-08 NOTE — Telephone Encounter (Signed)
.  rx faxed to pharmacy, manually.  

## 2012-05-09 ENCOUNTER — Telehealth: Payer: Self-pay

## 2012-05-09 MED ORDER — SIMVASTATIN 40 MG PO TABS: 40.0000 mg | ORAL_TABLET | Freq: Every day | ORAL | Status: DC

## 2012-05-09 NOTE — Telephone Encounter (Signed)
Spoke to pt advised of lab results, ordered new Rx, and mailed out copy of results.     MW

## 2012-05-12 LAB — VITAMIN D 1,25 DIHYDROXY: Vitamin D2 1, 25 (OH)2: <8 pg/mL

## 2012-05-14 NOTE — Assessment & Plan Note (Signed)
Chronic problem.  Will not increase klonopin due to OSA and risk of respiratory depression.  Increase SSRI to improve depression and hopefully insomnia.  Encouraged her to discuss issues w/ pulm who is managing her OSA.  Will follow.

## 2012-05-14 NOTE — Assessment & Plan Note (Signed)
Pt's PE WNL w/ exception of obesity.  Overdue on mammo and DEXA- encouraged her to schedule these.  Has upcoming colonoscopy.  Check labs.  Anticipatory guidance provided.

## 2012-05-14 NOTE — Assessment & Plan Note (Signed)
Chronic problem.  Adequate control.  Asymptomatic.  No changes. 

## 2012-05-14 NOTE — Assessment & Plan Note (Signed)
Check labs.  Replete prn. 

## 2012-05-14 NOTE — Assessment & Plan Note (Signed)
Chronic problem.  Tolerating meds w/out difficulty.  Check labs.  Adjust meds prn  

## 2012-05-14 NOTE — Assessment & Plan Note (Signed)
Deteriorated.  May be contributing to insomnia.  Increase SSRI.  Reviewed supportive care and red flags that should prompt return.  Pt expressed understanding and is in agreement w/ plan.

## 2012-05-15 ENCOUNTER — Encounter: Payer: Medicare Other | Admitting: Internal Medicine

## 2012-05-27 ENCOUNTER — Ambulatory Visit (INDEPENDENT_AMBULATORY_CARE_PROVIDER_SITE_OTHER): Payer: Medicare Other | Admitting: Internal Medicine

## 2012-05-27 ENCOUNTER — Encounter: Payer: Self-pay | Admitting: Internal Medicine

## 2012-05-27 VITALS — BP 150/80 | HR 64 | Ht 64.0 in | Wt 219.0 lb

## 2012-05-27 DIAGNOSIS — Z860100 Personal history of colon polyps, unspecified: Secondary | ICD-10-CM

## 2012-05-27 DIAGNOSIS — K589 Irritable bowel syndrome without diarrhea: Secondary | ICD-10-CM

## 2012-05-27 DIAGNOSIS — R1031 Right lower quadrant pain: Secondary | ICD-10-CM

## 2012-05-27 DIAGNOSIS — R197 Diarrhea, unspecified: Secondary | ICD-10-CM

## 2012-05-27 DIAGNOSIS — K219 Gastro-esophageal reflux disease without esophagitis: Secondary | ICD-10-CM

## 2012-05-27 MED ORDER — METOCLOPRAMIDE HCL 10 MG PO TABS: ORAL_TABLET | ORAL | Status: DC

## 2012-05-27 MED ORDER — MOVIPREP 100 G PO SOLR: 1.0000 | Freq: Once | ORAL | Status: DC

## 2012-05-27 NOTE — Progress Notes (Signed)
HISTORY OF PRESENT ILLNESS:  Ashley Woods is a 66 y.o. female with multiple medical problems as listed below. She has been seen in this office previously for diarrhea predominant irritable bowel syndrome and adenomatous colon polyps. She also has a history of GERD. Patient last underwent colonoscopy 12/15/2005. She was found to have multiple diminutive adenomatous colon polyps and diverticulosis. Routine followup in 5 years recommended. She was diagnosed with diarrhea predominant irritable bowel syndrome. Treatments have included Levbid, empiric metronidazole, probiotic, and Imodium. She has not been seen since her last office visit 04/16/2008. She presents today with a number of complaints. First, worsening irritable bowel syndrome as manifested by increased sharp lower abdominal pain, urgency, and diarrhea. This worsened about 4 weeks ago. Meals exacerbate symptoms. She takes Imodium 2 tablets once weekly. Her bowels can alternate a bit. Next, she reports constant throbbing discomfort in the right lower quadrant. She has had this for many years, but states that it is worse over the past 6 months. She has had prior gynecologic surgeries as well as hernia repair with mesh. She denies nausea, vomiting, melena, hematochezia, fevers, or weight loss. She is frustrated over symptoms and hope for "a pill" to resolve these issues. She continues to smoke. No regular exerciserate review of outside laboratories from 05/08/2012 reveals normal comprehensive metabolic panel and CBC. Abnormal lipids. Normal thyroid testing.  REVIEW OF SYSTEMS:  All non-GI ROS negative except for joint aches and depression  Past Medical History  Diagnosis Date  . Hypertension   . Hyperlipidemia   . GERD (gastroesophageal reflux disease)   . Colon polyps   . IBS (irritable bowel syndrome)   . Overactive bladder   . Depression   . Asthma   . Sleep apnea     uses cpap  . Rosacea   . Macular degeneration     Dr. Jackquline Woods    . COPD (chronic obstructive pulmonary disease)   . Diverticulosis   . Hemorrhoids     Past Surgical History  Procedure Date  . Shoulder surgery   . Hernia repair   . Cervical fusion   . Partial hysterectomy   . Oophorectomy   . Tonsillectomy     Social History Ashley Woods  reports that she has been smoking.  She has never used smokeless tobacco. She reports that she does not drink alcohol or use illicit drugs.  family history includes Heart disease in her daughter and Ashley Woods syndrome in her daughter.  There is no history of Colon cancer and Inflammatory bowel disease.  Allergies  Allergen Reactions  . Sulfamethoxazole W-Trimethoprim Nausea And Vomiting  . Tramadol Hcl     REACTION: nausea       PHYSICAL EXAMINATION: Vital signs: BP 150/80  Pulse 64  Ht 5\' 4"  (1.626 m)  Wt 219 lb (99.338 kg)  BMI 37.59 kg/m2  Constitutional: obese,unhealthy-appearing, no acute distress. Tobacco smell Psychiatric: alert and oriented x3, cooperative Eyes: extraocular movements intact, anicteric, conjunctiva pink Mouth: oral pharynx moist, no lesions Neck: supple no lymphadenopathy Cardiovascular: heart regular rate and rhythm, no murmur Lungs: clear to auscultation bilaterally Abdomen: soft, obese, tender throughout most quadrants without rebound, nondistended, no obvious ascites, no peritoneal signs, normal bowel sounds, no organomegaly Rectal:deferred until colonoscopy Extremities: no lower extremity edema bilaterally Skin: no lesions on visible extremities Neuro: No focal deficits.   ASSESSMENT:  #1. Diarrhea predominant irritable bowel syndrome. Deteriorated. May have had recent GI infection to explain worsening symptoms. Recently had Celexa  increased for depression. This may help. #2. History of multiple adenomatous colon polyps. Last colonoscopy 2007. Slightly Overdue for followup #3. Chronic right lower quadrant pain. Worse over the past 6 months.  Possibly adhesions. #4. GERD. Stable on PPI #5. Multiple medical problems   PLAN:  #1. Reflux precautions with attention to weight loss #2. Exercise and discontinue smoking #3. Continue PPI for GERD #4. Surveillance colonoscopy. As well, biopsies to rule out microscopic colitis.The nature of the procedure, as well as the risks, benefits, and alternatives were carefully and thoroughly reviewed with the patient. Ample time for discussion and questions allowed. The patient understood, was satisfied, and agreed to proceed.  #5. Movi prep prescribed. The patient raised concerns regarding the ability to tolerate the prep. Recommended hard candy while consuming the prep. As well, prescribed metoclopramide 10 mg to be taken 20 minutes prior to each prep session #6. CT of the abdomen and pelvis, contrast-enhanced, post colonoscopy (if no additional findings on colonoscopy to explain pain) to evaluate worsening right lower quadrant pain #7. Ongoing general medical care with Dr. Beverely Woods

## 2012-05-27 NOTE — Patient Instructions (Addendum)
You have been given a separate informational sheet regarding your tobacco use, the importance of quitting and local resources to help you quit.  You have been scheduled for a colonoscopy with propofol. Please follow written instructions given to you at your visit today.  Please pick up your prep kit at the pharmacy within the next 1-3 days. If you use inhalers (even only as needed) or a CPAP machine, please bring them with you on the day of your procedure.  We have sent the following medications to your pharmacy for you to pick up at your convenience:  Reglan  Sucking on hard candy while drinking the prep may also improve the taste

## 2012-05-28 ENCOUNTER — Encounter: Payer: Medicare Other | Admitting: Internal Medicine

## 2012-05-30 ENCOUNTER — Encounter: Payer: Self-pay | Admitting: Internal Medicine

## 2012-05-30 ENCOUNTER — Ambulatory Visit (AMBULATORY_SURGERY_CENTER): Payer: Medicare Other | Admitting: Internal Medicine

## 2012-05-30 VITALS — BP 165/92 | HR 81 | Temp 98.1°F | Resp 21 | Ht 64.0 in | Wt 219.0 lb

## 2012-05-30 DIAGNOSIS — K589 Irritable bowel syndrome without diarrhea: Secondary | ICD-10-CM

## 2012-05-30 DIAGNOSIS — Z86010 Personal history of colonic polyps: Secondary | ICD-10-CM

## 2012-05-30 DIAGNOSIS — D126 Benign neoplasm of colon, unspecified: Secondary | ICD-10-CM

## 2012-05-30 DIAGNOSIS — R1031 Right lower quadrant pain: Secondary | ICD-10-CM

## 2012-05-30 DIAGNOSIS — Z1211 Encounter for screening for malignant neoplasm of colon: Secondary | ICD-10-CM

## 2012-05-30 DIAGNOSIS — R197 Diarrhea, unspecified: Secondary | ICD-10-CM

## 2012-05-30 MED ORDER — SODIUM CHLORIDE 0.9 % IV SOLN: 500.0000 mL | INTRAVENOUS | Status: DC

## 2012-05-30 NOTE — Patient Instructions (Addendum)
YOU HAD AN ENDOSCOPIC PROCEDURE TODAY AT THE [facility]: Refer to the procedure report that was given to you for any specific questions about what was found during the examination.  If the procedure report does not answer your questions, please call your gastroenterologist to clarify.  If you requested that your care partner not be given the details of your procedure findings, then the procedure report has been included in a sealed envelope for you to review at your convenience later.  YOU SHOULD EXPECT: Some feelings of bloating in the abdomen. Passage of more gas than usual.  Walking can help get rid of the air that was put into your GI tract during the procedure and reduce the bloating. If you had a lower endoscopy (such as a colonoscopy or flexible sigmoidoscopy) you may notice spotting of blood in your stool or on the toilet paper. If you underwent a bowel prep for your procedure, then you may not have a normal bowel movement for a few days.  DIET: Your first meal following the procedure should be a light meal and then it is ok to progress to your normal diet.  A half-sandwich or bowl of soup is an example of a good first meal.  Heavy or fried foods are harder to digest and may make you feel nauseous or bloated.  Likewise meals heavy in dairy and vegetables can cause extra gas to form and this can also increase the bloating.  Drink plenty of fluids but you should avoid alcoholic beverages for 24 hours.  ACTIVITY: Your care partner should take you home directly after the procedure.  You should plan to take it easy, moving slowly for the rest of the day.  You can resume normal activity the day after the procedure however you should NOT DRIVE or use heavy machinery for 24 hours (because of the sedation medicines used during the test).    SYMPTOMS TO REPORT IMMEDIATELY: A gastroenterologist can be reached at any hour.  During normal business hours, 8:30 AM to 5:00 PM Monday through Friday,  call [phone].  After hours and on weekends, please call the GI answering service at [phone] who will take a message and have the physician on call contact you.   Following lower endoscopy (colonoscopy or flexible sigmoidoscopy):  Excessive amounts of blood in the stool  Significant tenderness or worsening of abdominal pains  Swelling of the abdomen that is new, acute  Fever of 100F or higher    FOLLOW UP: If any biopsies were taken you will be contacted by phone or by letter within the next 1-3 weeks.  Call your gastroenterologist if you have not heard about the biopsies in 3 weeks.  Our staff will call the home number listed on your records the next business day following your procedure to check on you and address any questions or concerns that you may have at that time regarding the information given to you following your procedure. This is a courtesy call and so if there is no answer at the home number and we have not heard from you through the emergency physician on call, we will assume that you have returned to your regular daily activities without incident.  SIGNATURES/CONFIDENTIALITY: You and/or your care partner have signed paperwork which will be entered into your electronic medical record.  These signatures attest to the fact that that the information above on your After Visit Summary has been reviewed and is understood.  Full responsibility of the confidentiality   of this discharge information lies with you and/or your care-partner.     INFORMATION ON POLYPS GIVEN TO YOU TODAY  DR. PERRYS OFFICE WILL SCHEDULE A CT SCAN & CALL YOU WITH THESE DETAILS   CONTRAST GIVEN TO YOU  TODAY BUT INSTRUCTIONS WILL BE GIVEN TO YOU ON  GIVEN TO YOU BY DR PERRYS OFFICE WHEN  THEY GIVE YOU DATE & TIME FOR CT SCAN TO BE DONE   MAKE APPOINTMENT TO SEE DR PERRY BACK IN 6 WEEKS

## 2012-05-30 NOTE — Progress Notes (Signed)
PT GIVEN CONTRAST 2 BOTTLES TO HAVE FOR CT PREP. WITH INSTUCTIONS THAT DR. PERRY'S OFFICE WILL CALL WITH DETAILS TIME & DATE FOR THIS . LAB IS PRESENTLY CLOSED BUT CHECKED & PT HAD CREATININE CLEARANCE 3 WKS AGO THAT WAS WNL

## 2012-05-30 NOTE — Op Note (Signed)
[facility] [address] [place], [address]   COLONOSCOPY PROCEDURE REPORT  PATIENT: Ashley Woods, Ashley Woods  MR#: [id number] BIRTHDATE: [birth year] , 66  yrs. old GENDER: Female ENDOSCOPIST: Roxy Cedar, MD REFERRED BY:.  Self / Office PROCEDURE DATE:  05/30/2012 PROCEDURE:   Colonoscopy with biopsies and Colonoscopy with snare polypectomy x 1 ASA CLASS:   Class II INDICATIONS:patient's personal history of adenomatous colon polyps (multiple TAs 11-2005), chronic diarrhea (worse), and abdominal pain in the lower right quadrant (chronic, but worse). MEDICATIONS: MAC sedation, administered by CRNA and propofol (Diprivan) 500mg  IV  DESCRIPTION OF PROCEDURE:   After the risks benefits and alternatives of the procedure were thoroughly explained, informed consent was obtained.  A digital rectal exam revealed no abnormalities of the rectum.   The LB [id number]  endoscope was introduced through the anus and advanced to the cecum, which was identified by both the appendix and ileocecal valve. No adverse events experienced.   The quality of the prep was good, using MoviPrep  The instrument was then slowly withdrawn as the colon was fully examined.      COLON FINDINGS: The mucosa appeared normal in the terminal ileum. A diminutive polyp was found at the cecum.  A polypectomy was performed with a cold snare.  The resection was complete and the polyp tissue was completely retrieved.   The colon mucosa was otherwise normal. Multiple  random biopsies taken.  Retroflexed views revealed internal hemorrhoids. The time to cecum=2 minutes 40 seconds.  Withdrawal time=14 minutes 44 seconds.  The scope was withdrawn and the procedure completed. COMPLICATIONS: There were no complications.  ENDOSCOPIC IMPRESSION: 1.   Normal mucosa in the terminal ileum 2.   Diminutive polyp was found at the cecum; polypectomy was performed with a cold snare 3.   The colon mucosa was  otherwise normal . Biopsies taken  RECOMMENDATIONS: 1.  Await biopsy results 2.  Follow up colonoscopy in 5 years ( multiple adenomas on previous exam) 3.  My office will arrange for you to have a  contrast-enhanced CT scan of abdomen and pelvis  " worsening right lower quadrant pain" 4.  Office followup in 6 weeks. eSigned:  Roxy Cedar, MD 05/30/2012 4:56 PMre         cc: Sheliah Hatch, MD and The Patient   PATIENT NAME:  Ashley Woods, Ashley Woods MR#: [id number]

## 2012-05-30 NOTE — Progress Notes (Signed)
Patient did not experience any of the following events: a burn prior to discharge; a fall within the facility; wrong site/side/patient/procedure/implant event; or a hospital transfer or hospital admission upon discharge from the facility. (G8907) Patient did not have preoperative order for IV antibiotic SSI prophylaxis. (G8918)  

## 2012-05-31 ENCOUNTER — Other Ambulatory Visit: Payer: Self-pay | Admitting: Internal Medicine

## 2012-05-31 ENCOUNTER — Telehealth: Payer: Self-pay | Admitting: *Deleted

## 2012-05-31 ENCOUNTER — Telehealth: Payer: Self-pay

## 2012-05-31 DIAGNOSIS — R109 Unspecified abdominal pain: Secondary | ICD-10-CM

## 2012-05-31 NOTE — Telephone Encounter (Signed)
  Follow up Call-  Call back number 05/30/2012  Post procedure Call Back phone  # [phone]  Permission to leave phone message Yes     No answer, Left message.

## 2012-05-31 NOTE — Telephone Encounter (Signed)
Pt scheduled for CT of abdomen and pelvis at [facility] 06/05/12, pt to arrive at 12:45pm for a 1pm appt. Pt should drink 1st bottle of contrast at 11am and the second bottle at 12noon. Pt should be NPO after 9am except for the contrast. Left message for pt to call back.  Pt aware of appt date and time.

## 2012-06-05 ENCOUNTER — Encounter: Payer: Self-pay | Admitting: Internal Medicine

## 2012-06-05 ENCOUNTER — Ambulatory Visit (INDEPENDENT_AMBULATORY_CARE_PROVIDER_SITE_OTHER)
Admission: RE | Admit: 2012-06-05 | Discharge: 2012-06-05 | Disposition: A | Discharge status: Home / Self Care | Payer: Medicare Other | Source: Ambulatory Visit | Attending: Internal Medicine | Admitting: Internal Medicine

## 2012-06-05 DIAGNOSIS — R109 Unspecified abdominal pain: Secondary | ICD-10-CM

## 2012-06-05 MED ORDER — IOHEXOL 300 MG/ML  SOLN
100.0000 mL | Freq: Once | INTRAMUSCULAR | Status: AC | PRN
  Administered 2012-06-05: 100 mL via INTRAVENOUS

## 2012-07-10 ENCOUNTER — Other Ambulatory Visit: Payer: Medicare Other

## 2012-07-10 ENCOUNTER — Encounter: Payer: Self-pay | Admitting: Internal Medicine

## 2012-07-10 ENCOUNTER — Ambulatory Visit (INDEPENDENT_AMBULATORY_CARE_PROVIDER_SITE_OTHER): Payer: Medicare Other | Admitting: Internal Medicine

## 2012-07-10 VITALS — BP 164/90 | HR 76 | Ht 64.0 in | Wt 220.4 lb

## 2012-07-10 DIAGNOSIS — R109 Unspecified abdominal pain: Secondary | ICD-10-CM

## 2012-07-10 DIAGNOSIS — K219 Gastro-esophageal reflux disease without esophagitis: Secondary | ICD-10-CM

## 2012-07-10 DIAGNOSIS — K589 Irritable bowel syndrome without diarrhea: Secondary | ICD-10-CM

## 2012-07-10 DIAGNOSIS — K8020 Calculus of gallbladder without cholecystitis without obstruction: Secondary | ICD-10-CM

## 2012-07-10 NOTE — Progress Notes (Signed)
HISTORY OF PRESENT ILLNESS:  Ashley Woods is a 66 y.o. female with multiple medical problems as listed below. She has been seen in this office for GERD, diarrhea predominant irritable bowel syndrome, and adenomatous colon polyps. Last office evaluation 05/27/2012 (see that dictation). The main issues at that time or worsening diarrhea and chronic, but worsening right lower quadrant pain. She subsequently underwent surveillance colonoscopy on 05/30/2012. The examination revealed normal colonic and ileal U. Koza. A diminutive cecal polyp was found and removed (adenoma). Random biopsies of the colon returned normal with no evidence of microscopic colitis. Routine followup in 5 years recommended. She was set up for a CT scan of the abdomen and pelvis to evaluate pain. Examination was remarkable for uncomplicated cholelithiasis. No other significant abnormalities. She presents today for followup. Since her last visit, she reports improvement in diarrhea. Possibly secondary to Celexa. He continues with right-sided discomfort. We discussed the CT scan findings. Upon further questioning she does describe epigastric discomfort with a.m. To like radiation across the upper abdomen. As well occasional problems with chest pain. No nausea or vomiting. She has not lost weight and continues to smoke  REVIEW OF SYSTEMS:  All non-GI ROS negative except for arthritis  Past Medical History  Diagnosis Date  . Hypertension   . Hyperlipidemia   . GERD (gastroesophageal reflux disease)   . Colon polyps   . IBS (irritable bowel syndrome)   . Overactive bladder   . Depression   . Asthma   . Sleep apnea     uses cpap  . Rosacea   . Macular degeneration     Dr. Jackquline Bosch  . COPD (chronic obstructive pulmonary disease)   . Diverticulosis   . Hemorrhoids   . Colon polyp     adenomatous    Past Surgical History  Procedure Date  . Shoulder surgery   . Hernia repair   . Cervical fusion   . Partial  hysterectomy   . Oophorectomy   . Tonsillectomy     Social History Ashley Woods  reports that she has been smoking.  She has never used smokeless tobacco. She reports that she does not drink alcohol or use illicit drugs.  family history includes Heart disease in her daughter and [name] in her daughter.  There is no history of Colon cancer and Inflammatory bowel disease.  Allergies  Allergen Reactions  . Sulfamethoxazole W-Trimethoprim Nausea And Vomiting  . Tramadol Hcl     REACTION: nausea       PHYSICAL EXAMINATION: Vital signs: BP 164/90  Pulse 76  Ht 5\' 4"  (1.626 m)  Wt 220 lb 6.4 oz (99.973 kg)  BMI 37.83 kg/m2 General: obese and unhealthy appearing but well-developed, well-nourished, no acute distress HEENT: Sclerae are anicteric, conjunctiva pink. Oral mucosa intact Lungs: Clear Heart: Regular Abdomen: soft, obese, nontender, nondistended, no obvious ascites, no peritoneal signs, normal bowel sounds. No organomegaly. Prior surgical incisions well-healed. Extremities: No edema Psychiatric: alert and oriented x3. Cooperative    ASSESSMENT:  #1. GERD. Controlled on PPI #2. Diarrhea predominant IBS. Improved #3. Chronic right-sided pain. Etiology unclear #4. Cholelithiasis. Possible cause for upper abdominal discomfort described today #5. Adenomatous colon polyps   PLAN:  #1. Reflux precautions #2. Lose weight #3. Stop smoking #4. Continue PPI #5. Screen for celiac disease with tissue transglutaminase antibody #6. Surgical opinion regarding cholelithiasis and its relationship to abdominal complaints #7. Surveillance colonoscopy in 5 years

## 2012-07-10 NOTE — Patient Instructions (Addendum)
Your physician has requested that you go to the basement for the following lab work before leaving today: TTG   You have been scheduled for a surgical appointment with Dr. Laurell Josephs at [facility] on 08-21-12 at 9:20am.  Please arrive at 8:50am. The address is [address] [place] [address].  If you need to reschedule the appointment the phone number is [phone].

## 2012-07-16 ENCOUNTER — Telehealth: Payer: Self-pay | Admitting: Family Medicine

## 2012-07-16 NOTE — Telephone Encounter (Signed)
Please advise on RF request.//AB/CMA 

## 2012-07-16 NOTE — Telephone Encounter (Signed)
Patient called requesting refill on hydrocodone be sent to [facility] on [address]. Patient was asked to call her pharmacy but refused stating it takes too long.

## 2012-07-17 NOTE — Telephone Encounter (Signed)
Ok for #60, no refills.  Needs to pick up script, sign agreement, and do UDS

## 2012-07-18 ENCOUNTER — Other Ambulatory Visit: Payer: Self-pay | Admitting: *Deleted

## 2012-07-18 DIAGNOSIS — R52 Pain, unspecified: Secondary | ICD-10-CM

## 2012-07-18 MED ORDER — HYDROCODONE-ACETAMINOPHEN 5-500 MG PO TABS: 1.0000 | ORAL_TABLET | Freq: Four times a day (QID) | ORAL | Status: DC | PRN

## 2012-07-18 NOTE — Telephone Encounter (Signed)
Hydrocodone refilled #60 no refill. Printed and placed up front for pt to pick up. Needs to sign agreement and submit to UDS.

## 2012-07-18 NOTE — Telephone Encounter (Signed)
Pt left msg on triage vmail checking status of refill.

## 2012-07-19 NOTE — Telephone Encounter (Signed)
LM @ (8:45am) asking the pt to RTC//AB/CMA

## 2012-07-19 NOTE — Telephone Encounter (Signed)
Informed pt that the rx she requested is ready, and we will need for her to pick it up and sign an agreement.  Pt agreed.//AB/CMA

## 2012-08-21 ENCOUNTER — Ambulatory Visit (INDEPENDENT_AMBULATORY_CARE_PROVIDER_SITE_OTHER): Payer: Self-pay | Admitting: General Surgery

## 2012-09-14 ENCOUNTER — Other Ambulatory Visit: Payer: Self-pay | Admitting: Family Medicine

## 2012-09-14 NOTE — Telephone Encounter (Signed)
Please advise on RE request.  Last OV:05-08-12.//AB/CMA

## 2012-09-18 ENCOUNTER — Ambulatory Visit (INDEPENDENT_AMBULATORY_CARE_PROVIDER_SITE_OTHER): Payer: Medicare Other | Admitting: General Surgery

## 2012-09-24 ENCOUNTER — Telehealth: Payer: Self-pay | Admitting: Family Medicine

## 2012-09-24 DIAGNOSIS — R52 Pain, unspecified: Secondary | ICD-10-CM

## 2012-09-24 MED ORDER — HYDROCODONE-ACETAMINOPHEN 5-325 MG PO TABS: 1.0000 | ORAL_TABLET | Freq: Four times a day (QID) | ORAL | Status: DC | PRN

## 2012-09-24 NOTE — Telephone Encounter (Signed)
Dr.Tabori please advise, last OV 05/08/12 (CPX), last filled 07/18/12 #30. If I recall correctly the 5-500 is not available by [employer]

## 2012-09-24 NOTE — Telephone Encounter (Signed)
pt needs refill for   Hydrocodone-Acetaminophen (Tab) 5-500 MG Take 1 tablet by mouth every 6 (six) hours as needed for pain.       pharmacy [facility] on [address] pt callback# [phone]

## 2012-09-24 NOTE — Telephone Encounter (Signed)
Will need to switch to 5/325mg  tabs.  Ok for #30, no refills

## 2012-09-24 NOTE — Telephone Encounter (Signed)
Rx printed and faxed to the pharmacy([facility]//AB/CMA

## 2012-09-26 ENCOUNTER — Encounter: Payer: Self-pay | Admitting: Family Medicine

## 2012-10-01 ENCOUNTER — Ambulatory Visit: Payer: Medicare Other | Admitting: Family Medicine

## 2012-10-07 ENCOUNTER — Ambulatory Visit (INDEPENDENT_AMBULATORY_CARE_PROVIDER_SITE_OTHER): Payer: Medicare HMO | Admitting: Family Medicine

## 2012-10-07 ENCOUNTER — Encounter: Payer: Self-pay | Admitting: Family Medicine

## 2012-10-07 VITALS — BP 128/78 | HR 72 | Temp 98.1°F | Wt 209.0 lb

## 2012-10-07 DIAGNOSIS — M549 Dorsalgia, unspecified: Secondary | ICD-10-CM

## 2012-10-07 DIAGNOSIS — R7309 Other abnormal glucose: Secondary | ICD-10-CM

## 2012-10-07 LAB — BASIC METABOLIC PANEL
CO2: 25 mEq/L (ref 19–32)
Calcium: 9.9 mg/dL (ref 8.4–10.5)
Creatinine, Ser: 0.8 mg/dL (ref 0.4–1.2)
GFR: 77.28 mL/min (ref >60.00)

## 2012-10-07 LAB — HEMOGLOBIN A1C: Hgb A1c MFr Bld: 6 % (ref 4.6–6.5)

## 2012-10-07 MED ORDER — FLUTICASONE-SALMETEROL 250-50 MCG/DOSE IN AEPB: 1.0000 | INHALATION_SPRAY | Freq: Two times a day (BID) | RESPIRATORY_TRACT | Status: DC

## 2012-10-07 MED ORDER — HYDROCODONE-ACETAMINOPHEN 5-325 MG PO TABS: 1.0000 | ORAL_TABLET | Freq: Four times a day (QID) | ORAL | Status: DC | PRN

## 2012-10-07 NOTE — Patient Instructions (Addendum)
We'll determine the next steps based on your lab results Try and continue the healthy food choices and get regular activity Call with any questions or concerns Hang in there!!

## 2012-10-07 NOTE — Progress Notes (Signed)
  Subjective:    Patient ID: Ashley Woods, female    DOB: 1945-08-17, 67 y.o.   MRN: [id number]  HPI Pt was recently hospitalized for cholecystitis and was told prior to surgery that she had DM.  Had 2 UTIs prior to cholecystitis.  Pt thinks she was told that 3 month sugar avg was either '157 or 175'.  Fasting CBG in Oct was 95.  Now having increased hunger, increased thirst, losing weight.  Is having sxs of feeling 'shaky, foggy'.  + night sweats.  No mention of elevated sugars or diabetes in D/C summary.  Back pain- pt was given hydrocodone at time of hospital d/c on 2/21 and was then given 30 more on 2/25 by me.  Pt now asking why she didn't get her usual 60 tabs.  Pointed out that she had meds from other source.  Pt reports she was taking those meds 1-2 tabs every 4 hrs for surgical pain and is completely out of meds.  Requesting her 30 remaining pills.   Review of Systems For ROS see HPI     Objective:   Physical Exam  Vitals reviewed. Constitutional: She is oriented to person, place, and time. She appears well-developed and well-nourished. No distress.  HENT:  Head: Normocephalic and atraumatic.  Eyes: Conjunctivae and EOM are normal. Pupils are equal, round, and reactive to light.  Neck: Normal range of motion. Neck supple. No thyromegaly present.  Cardiovascular: Normal rate, regular rhythm, normal heart sounds and intact distal pulses.   No murmur heard. Pulmonary/Chest: Effort normal and breath sounds normal. No respiratory distress.  Abdominal: Soft. She exhibits no distension. There is no tenderness.  Musculoskeletal: She exhibits no edema.  Lymphadenopathy:    She has no cervical adenopathy.  Neurological: She is alert and oriented to person, place, and time.  Skin: Skin is warm and dry.  Psychiatric: She has a normal mood and affect. Her behavior is normal.          Assessment & Plan:

## 2012-10-08 NOTE — Assessment & Plan Note (Signed)
New.  This is according to pt's report from labs at [facility].  No labs available to review.  Will check BMP and A1C.  Stressed importance of regular exercise and low carb diet.  Will follow.

## 2012-10-08 NOTE — Assessment & Plan Note (Signed)
Chronic problem.  Maintained on narcotics prn.  Reviewed recent increased use of meds.  Stressed that she should only take meds prn for extreme pain.  30 additional pills given- will monitor use closely.

## 2012-10-24 ENCOUNTER — Other Ambulatory Visit: Payer: Self-pay | Admitting: Family Medicine

## 2012-11-07 ENCOUNTER — Other Ambulatory Visit: Payer: Self-pay | Admitting: Family Medicine

## 2012-11-07 NOTE — Telephone Encounter (Signed)
Last OV 10-07-12, last filled 05-08-12 #45 3

## 2012-11-08 MED ORDER — CLONAZEPAM 0.5 MG PO TABS: 0.7500 mg | ORAL_TABLET | Freq: Every evening | ORAL | Status: DC | PRN

## 2012-11-08 NOTE — Addendum Note (Signed)
Addended by: Candie Echevaria L on: 11/08/2012 09:42 AM   Modules accepted: Orders

## 2012-12-17 ENCOUNTER — Ambulatory Visit (INDEPENDENT_AMBULATORY_CARE_PROVIDER_SITE_OTHER): Payer: Medicare HMO | Admitting: Family Medicine

## 2012-12-17 ENCOUNTER — Encounter: Payer: Self-pay | Admitting: Family Medicine

## 2012-12-17 ENCOUNTER — Ambulatory Visit (HOSPITAL_BASED_OUTPATIENT_CLINIC_OR_DEPARTMENT_OTHER)
Admission: RE | Admit: 2012-12-17 | Discharge: 2012-12-17 | Disposition: A | Discharge status: Home / Self Care | Payer: Medicare HMO | Source: Ambulatory Visit | Attending: Family Medicine | Admitting: Family Medicine

## 2012-12-17 VITALS — BP 146/70 | HR 72 | Temp 98.6°F | Ht 63.75 in | Wt 201.2 lb

## 2012-12-17 DIAGNOSIS — Z981 Arthrodesis status: Secondary | ICD-10-CM | POA: Insufficient documentation

## 2012-12-17 DIAGNOSIS — M949 Disorder of cartilage, unspecified: Secondary | ICD-10-CM | POA: Insufficient documentation

## 2012-12-17 DIAGNOSIS — M542 Cervicalgia: Secondary | ICD-10-CM

## 2012-12-17 DIAGNOSIS — M899 Disorder of bone, unspecified: Secondary | ICD-10-CM | POA: Insufficient documentation

## 2012-12-17 MED ORDER — TIZANIDINE HCL 4 MG PO TABS: 4.0000 mg | ORAL_TABLET | Freq: Four times a day (QID) | ORAL | Status: DC | PRN

## 2012-12-17 MED ORDER — HYDROCODONE-ACETAMINOPHEN 5-325 MG PO TABS: 1.0000 | ORAL_TABLET | Freq: Four times a day (QID) | ORAL | Status: DC | PRN

## 2012-12-17 NOTE — Assessment & Plan Note (Addendum)
New.  Pt w/ 2 weeks of pain w/o known injury.  Has hardware present in neck.  Xray to assess positioning.  Start muscle relaxer due to spasm.  Continue Vicodin prn.  Encouraged her to call surgeon if pain doesn't improve w/ meds.  Will follow.

## 2012-12-17 NOTE — Patient Instructions (Addendum)
[facility] to [facility] [address] and get [employer] Continue the Vicodin as needed for pain Add the Zanaflex for spasm Call Dr Higinio Plan and tell her about the pain- she needs to be involved! Call with any questions or concerns Hang in there!
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
## 2012-12-27 ENCOUNTER — Other Ambulatory Visit: Payer: Self-pay | Admitting: Family Medicine

## 2012-12-27 NOTE — Telephone Encounter (Signed)
Rx sent to the pharmacy by e-script.//AB/CMA 

## 2013-01-15 ENCOUNTER — Encounter: Payer: Self-pay | Admitting: Emergency Medicine

## 2013-01-15 ENCOUNTER — Emergency Department (INDEPENDENT_AMBULATORY_CARE_PROVIDER_SITE_OTHER)
Admission: EM | Admit: 2013-01-15 | Discharge: 2013-01-15 | Disposition: A | Discharge status: Home / Self Care | Payer: Medicare HMO | Source: Home / Self Care | Attending: Emergency Medicine | Admitting: Emergency Medicine

## 2013-01-15 DIAGNOSIS — R300 Dysuria: Secondary | ICD-10-CM

## 2013-01-15 DIAGNOSIS — N390 Urinary tract infection, site not specified: Secondary | ICD-10-CM

## 2013-01-15 LAB — POCT URINALYSIS DIP (MANUAL ENTRY)
Protein Ur, POC: 30
Urobilinogen, UA: 0.2
pH, UA: 7.5

## 2013-01-15 MED ORDER — CIPROFLOXACIN HCL 500 MG PO TABS: 500.0000 mg | ORAL_TABLET | Freq: Two times a day (BID) | ORAL | Status: DC

## 2013-01-15 NOTE — ED Notes (Signed)
Reports pressure upon urination and frequency. Took one dose AZO yesterday.

## 2013-01-15 NOTE — ED Provider Notes (Signed)
History     CSN: [id number]  Arrival date & time 01/15/13  1005   First MD Initiated Contact with Patient 01/15/13 1011      Chief Complaint  Patient presents with  . Dysuria  . Urinary Frequency    (Consider location/radiation/quality/duration/timing/severity/associated sxs/prior treatment) HPI Ashley Woods is a 67 y.o. female who presents today with UTI symptoms for 3 days.  + dysuria + frequency + urgency No hematuria No vaginal discharge No fever/chills + lower abdominal pain +/- back pain (but she does have chronic neck and back pain) No fatigue   She also reports a history of stress incontinence which she takes Ditropan XL for her.  It tends to help fairly well however when she has a urinary infection it tends to get worse.  She states that her UTIs have been increasing in frequency and now she gets them about every 3 months and wonders if there's anything that she needs to do.  She has OTC phenazopyridine at home.  Past Medical History  Diagnosis Date  . Hypertension   . Hyperlipidemia   . GERD (gastroesophageal reflux disease)   . Colon polyps   . IBS (irritable bowel syndrome)   . Overactive bladder   . Depression   . Asthma   . Sleep apnea     uses cpap  . Rosacea   . Macular degeneration     Dr. Jackquline Bosch  . COPD (chronic obstructive pulmonary disease)   . Diverticulosis   . Hemorrhoids   . Colon polyp     adenomatous    Past Surgical History  Procedure Laterality Date  . Shoulder surgery    . Hernia repair    . Cervical fusion    . Partial hysterectomy    . Oophorectomy    . Tonsillectomy      Family History  Problem Relation Age of Onset  . Heart disease Daughter   . Evelene Croon Parkinson White syndrome Daughter   . Colon cancer Neg Hx   . Inflammatory bowel disease Neg Hx     History  Substance Use Topics  . Smoking status: Current Every Day Smoker -- 0.40 packs/day for 45 years  . Smokeless tobacco: Never Used     Comment: former 1ppd x45  years.  . Alcohol Use: No    OB History   Grav Para Term Preterm Abortions TAB SAB Ect Mult Living   2 2              Review of Systems  All other systems reviewed and are negative.    Allergies  Sulfamethoxazole w-trimethoprim and Tramadol hcl  Home Medications   Current Outpatient Rx  Name  Route  Sig  Dispense  Refill  . albuterol (PROVENTIL) (2.5 MG/3ML) 0.083% nebulizer solution   Nebulization   Take 3 mLs (2.5 mg total) by nebulization every 4 (four) hours as needed for wheezing or shortness of breath. Please dispense individual nebules   75 mL   3   . benzonatate (TESSALON) 100 MG capsule   Oral   Take 2 capsules (200 mg total) by mouth every 4 (four) hours as needed. Every 4-6 hrs   60 capsule   1   . ciprofloxacin (CIPRO) 500 MG tablet   Oral   Take 1 tablet (500 mg total) by mouth 2 (two) times daily.   10 tablet   0   . citalopram (CELEXA) 40 MG tablet      TAKE ONE TABLET BY MOUTH  ONCE DAILY   30 tablet   5   . clonazePAM (KLONOPIN) 0.5 MG tablet      TAKE ONE AND ONE-HALF TABLETS BY MOUTH AT BEDTIME AS NEEDED   45 tablet   0   . Fluticasone-Salmeterol (ADVAIR) 250-50 MCG/DOSE AEPB   Inhalation   Inhale 1 puff into the lungs 2 (two) times daily.   60 each   6   . HYDROcodone-acetaminophen (NORCO/VICODIN) 5-325 MG per tablet   Oral   Take 1 tablet by mouth every 6 (six) hours as needed for pain.   60 tablet   0   . EXPIRED: lisinopril (PRINIVIL,ZESTRIL) 10 MG tablet   Oral   Take 10 mg by mouth daily.         . metoCLOPramide (REGLAN) 10 MG tablet      Take one tablet 20 minutes before drinking the first prep and one tablet 20 minutes before drinking the second prep   2 tablet   0   . omeprazole (PRILOSEC) 40 MG capsule   Oral   Take 40 mg by mouth daily.           Marland Kitchen oxybutynin (DITROPAN-XL) 10 MG 24 hr tablet      TAKE ONE TABLET BY MOUTH EVERY DAY   30 tablet   5   . simvastatin (ZOCOR) 40 MG tablet   Oral   Take 1  tablet (40 mg total) by mouth at bedtime.   90 tablet   3   . tiZANidine (ZANAFLEX) 4 MG tablet   Oral   Take 1 tablet (4 mg total) by mouth every 6 (six) hours as needed.   45 tablet   0     BP 167/77  Temp(Src) 98.3 F (36.8 C) (Oral)  Resp 16  Ht 5\' 5"  (1.651 m)  Wt 200 lb (90.719 kg)  BMI 33.28 kg/m2  SpO2 97%  Physical Exam  Nursing note and vitals reviewed. Constitutional: She is oriented to person, place, and time. She appears well-developed and well-nourished.  HENT:  Head: Normocephalic and atraumatic.  Eyes: No scleral icterus.  Neck: Neck supple.  Cardiovascular: Regular rhythm and normal heart sounds.   Pulmonary/Chest: Effort normal and breath sounds normal. No respiratory distress.  Abdominal: Soft. Normal appearance and bowel sounds are normal. She exhibits no mass. There is no rebound, no guarding and no CVA tenderness.  Neurological: She is alert and oriented to person, place, and time.  Skin: Skin is warm and dry.  Psychiatric: She has a normal mood and affect. Her speech is normal.    ED Course  Procedures (including critical care time)  Labs Reviewed  URINE CULTURE  POCT URINALYSIS DIP (MANUAL ENTRY)   No results found.   1. Dysuria   2. Urinary tract infection, site not specified       MDM  1) Take the prescribed antibiotic (Cipro) as directed. 2) A urinalysis was done in clinic.  A urine culture is pending. 3) Follow up with your PCP or urologist if not improving or if worsening symptoms.  Due to her recurrent symptoms as well as stress incontinence, I advised that she may want to consider seeing a urologist.  I gave her information for the urology department at [facility].  Marlaine Hind, MD 01/15/13 1051

## 2013-01-17 ENCOUNTER — Telehealth: Payer: Self-pay | Admitting: *Deleted

## 2013-01-17 LAB — URINE CULTURE: Colony Count: 45000

## 2013-01-20 ENCOUNTER — Other Ambulatory Visit: Payer: Self-pay | Admitting: *Deleted

## 2013-01-20 MED ORDER — LISINOPRIL 10 MG PO TABS: 10.0000 mg | ORAL_TABLET | Freq: Every day | ORAL | Status: DC

## 2013-01-20 NOTE — Telephone Encounter (Signed)
Rx sent to the pharmacy by e-script.//AB/CMA 

## 2013-02-12 ENCOUNTER — Telehealth: Payer: Self-pay | Admitting: *Deleted

## 2013-02-12 MED ORDER — HYDROCODONE-ACETAMINOPHEN 5-325 MG PO TABS: 1.0000 | ORAL_TABLET | Freq: Four times a day (QID) | ORAL | Status: DC | PRN

## 2013-02-12 NOTE — Telephone Encounter (Signed)
Pharmacy has requested a refill for Norco 5-325 please advise

## 2013-02-12 NOTE — Telephone Encounter (Signed)
LVM that Rx has been sent to the pharmacy. GF/RN

## 2013-02-12 NOTE — Telephone Encounter (Signed)
Ok for #60, 1 refill 

## 2013-03-17 ENCOUNTER — Telehealth: Payer: Self-pay | Admitting: *Deleted

## 2013-03-17 ENCOUNTER — Telehealth: Payer: Self-pay | Admitting: Family Medicine

## 2013-03-17 MED ORDER — CLONAZEPAM 0.5 MG PO TABS: ORAL_TABLET | ORAL | Status: DC

## 2013-03-17 MED ORDER — TIZANIDINE HCL 4 MG PO TABS: 4.0000 mg | ORAL_TABLET | Freq: Four times a day (QID) | ORAL | Status: DC | PRN

## 2013-03-17 NOTE — Telephone Encounter (Signed)
Ok for #45, 1 refill 

## 2013-03-17 NOTE — Telephone Encounter (Signed)
Med filled per [facility].  

## 2013-03-17 NOTE — Telephone Encounter (Signed)
Patient states she would like refill on her muscle relaxer tizanidine. She already contacted her pharmacy and she states they were unable to help her. [facility] on [address]

## 2013-03-17 NOTE — Telephone Encounter (Signed)
Med filled.  

## 2013-03-17 NOTE — Telephone Encounter (Signed)
Pharmacy is requesting a refill for Clonazepam 0.5 mg last ov 12/17/12, last UDS 07/19/12 moderate risk, patient has a contract on file. Last date filled 11/07/12 ct 45 0 R  Please advise. Ag cma

## 2013-03-20 ENCOUNTER — Other Ambulatory Visit: Payer: Self-pay | Admitting: *Deleted

## 2013-03-20 MED ORDER — LISINOPRIL 10 MG PO TABS: ORAL_TABLET | ORAL | Status: DC

## 2013-03-20 NOTE — Telephone Encounter (Signed)
Rx was refilled for Lisinopril 10 mg. Ag cma  

## 2013-03-25 ENCOUNTER — Other Ambulatory Visit: Payer: Self-pay | Admitting: *Deleted

## 2013-03-25 NOTE — Telephone Encounter (Signed)
Error med already filled.  Ag cma

## 2013-04-02 ENCOUNTER — Ambulatory Visit (INDEPENDENT_AMBULATORY_CARE_PROVIDER_SITE_OTHER): Payer: Medicare HMO | Admitting: Family Medicine

## 2013-04-02 ENCOUNTER — Encounter: Payer: Self-pay | Admitting: Family Medicine

## 2013-04-02 VITALS — BP 152/80 | HR 71 | Temp 98.8°F | Wt 211.4 lb

## 2013-04-02 DIAGNOSIS — L299 Pruritus, unspecified: Secondary | ICD-10-CM | POA: Insufficient documentation

## 2013-04-02 LAB — HEPATIC FUNCTION PANEL
AST: 20 U/L (ref 0–37)
Albumin: 4.1 g/dL (ref 3.5–5.2)
Alkaline Phosphatase: 55 U/L (ref 39–117)
Total Protein: 6.8 g/dL (ref 6.0–8.3)

## 2013-04-02 LAB — BASIC METABOLIC PANEL
CO2: 27 mEq/L (ref 19–32)
Calcium: 9.3 mg/dL (ref 8.4–10.5)
Chloride: 103 mEq/L (ref 96–112)
Potassium: 4.2 mEq/L (ref 3.5–5.1)
Sodium: 138 mEq/L (ref 135–145)

## 2013-04-02 LAB — CBC WITH DIFFERENTIAL/PLATELET
Basophils Relative: 0.5 % (ref 0.0–3.0)
Eosinophils Absolute: 0.2 10*3/uL (ref 0.0–0.7)
HCT: 46.3 % — ABNORMAL HIGH (ref 36.0–46.0)
Hemoglobin: 16 g/dL — ABNORMAL HIGH (ref 12.0–15.0)
Lymphocytes Relative: 25.5 % (ref 12.0–46.0)
Lymphs Abs: 1.6 10*3/uL (ref 0.7–4.0)
MCHC: 34.6 g/dL (ref 30.0–36.0)
Neutro Abs: 4 10*3/uL (ref 1.4–7.7)
RBC: 4.93 Mil/uL (ref 3.87–5.11)

## 2013-04-02 MED ORDER — HYDROXYZINE HCL 25 MG PO TABS: 25.0000 mg | ORAL_TABLET | Freq: Three times a day (TID) | ORAL | Status: DC | PRN

## 2013-04-02 NOTE — Patient Instructions (Signed)
Itching Itching is a symptom that can be caused by many things. These include skin problems (including infections) as well as some internal diseases.  If the itching is affecting just one area of the body, it is most likely due to a common skin problem, such as:  Poison oak and poison ivy.  Contact dermatitis (skin irritation from a plant, chemicals, fiberglass, detergents, new cosmetic, new jewelry, or other substance).  Fungus (such as athlete's foot, jock itch, or ringworm).  Head lice  Dandruff  Insect bite  Infection (such as Shingles or other virus infections). If the itching is all over (widespread), the possible causes are many. These include:   Dry skin or eczema  Heat rash  Hives  Liver disorders  Kidney disorders TREATMENT  Localized itching   Lubrication of the skin. Use an ointment or cream or other unperfumed moisturizers if the skin is dry. Apply frequently, especially after bathing.  Anti-itch medicines. These medications may help control the urge to scratch. Scratching always makes itching worse and increases the chance of getting an infection.  Cortisone creams and ointments. These help reduce the inflammation.  Antibiotics. Skin infections can cause itching. Topical or oral antibiotics may be needed for 10 to 20 days to get rid of an infection. If you can identify what caused the itching, avoid this substance in the future.  Widespread itching  The following measures may help to relieve itching regardless of the cause:   Wash the skin once with soap to remove irritants.  Bathe in tepid water with baking soda, cornstarch, or oatmeal.  Use calamine lotion (nonprescription) or a baking soda solution (1 teaspoon in 4 ounces of water on the skin).  Apply 1% hydrocortisone cream (no prescription needed). Do not use this if there might be a skin infection.  Avoid scratching.  Avoid itchy or tight-fitting clothes.  Avoid excessive heat, sweating,  scented soaps, and swimming pools.  The lubricants, anti-itch medicines, etc. noted above may be helpful for controlling symptoms. SEEK MEDICAL CARE IF:   The itching becomes severe.  Your itch is not better after 1 week of treatment. Contact your caregiver to schedule further evaluation. Document Released: 07/17/2005 Document Revised: 10/09/2011 Document Reviewed: 01/04/2007 ExitCare Patient Information 2014 [employer].  

## 2013-04-02 NOTE — Assessment & Plan Note (Signed)
Hydroxyzine sent to pharmacy Check labs Consider neuro vs derm

## 2013-04-02 NOTE — Progress Notes (Signed)
  Subjective:    Patient ID: Ashley Woods, female    DOB: 04-Nov-1945, 67 y.o.   MRN: [id number]  HPI Pt here c/o itching all over.  No rash.  No insect bites.  No new lotions, soaps, perfumes etc.she states her hands and feet felt like they were burning.   Numbness shin down and palms.  Itching in arms and legs.     Review of Systems As above     Objective:   Physical Exam BP 152/80  Pulse 71  Temp(Src) 98.8 F (37.1 C) (Oral)  Wt 211 lb 6.4 oz (95.89 kg)  BMI 35.18 kg/m2  SpO2 96% General appearance: alert, cooperative, appears stated age and no distress Lungs: clear to auscultation bilaterally Heart: S1, S2 normal Extremities: extremities normal, atraumatic, no cyanosis or edema Skin: Skin color, texture, turgor normal. No rashes or lesions--+escoriations on arms and legs       Assessment & Plan:

## 2013-04-09 ENCOUNTER — Telehealth: Payer: Self-pay | Admitting: *Deleted

## 2013-04-09 NOTE — Telephone Encounter (Signed)
Pt is requesting refill too early.  And if it was given on 8/26 #30, 1 refill, she shouldn't need more until October

## 2013-04-09 NOTE — Telephone Encounter (Signed)
Refill request for clonazepam 0.5 mg Last ov 04/02/13 Last date filled 03/25/13  #30 1 R Last UDS 07/19/12 Moderate risk Patient has a contract on file.  Please Advise    Ag cma

## 2013-04-09 NOTE — Telephone Encounter (Signed)
Rx has been denied per Tabori.  Patient was denied because it is too soon for a refill.   Ag cma

## 2013-04-24 ENCOUNTER — Other Ambulatory Visit: Payer: Self-pay | Admitting: General Practice

## 2013-04-24 MED ORDER — CITALOPRAM HYDROBROMIDE 40 MG PO TABS: ORAL_TABLET | ORAL | Status: DC

## 2013-05-15 ENCOUNTER — Encounter: Payer: Self-pay | Admitting: Family Medicine

## 2013-05-15 ENCOUNTER — Ambulatory Visit (INDEPENDENT_AMBULATORY_CARE_PROVIDER_SITE_OTHER): Payer: Medicare HMO | Admitting: Family Medicine

## 2013-05-15 VITALS — BP 132/80 | HR 63 | Temp 98.8°F | Resp 16 | Wt 215.1 lb

## 2013-05-15 DIAGNOSIS — J441 Chronic obstructive pulmonary disease with (acute) exacerbation: Secondary | ICD-10-CM

## 2013-05-15 DIAGNOSIS — J329 Chronic sinusitis, unspecified: Secondary | ICD-10-CM

## 2013-05-15 MED ORDER — GUAIFENESIN-CODEINE 100-10 MG/5ML PO SYRP: 10.0000 mL | ORAL_SOLUTION | Freq: Three times a day (TID) | ORAL | Status: AC | PRN

## 2013-05-15 MED ORDER — PREDNISONE 10 MG PO TABS: ORAL_TABLET | ORAL | Status: AC

## 2013-05-15 MED ORDER — DOXYCYCLINE HYCLATE 100 MG PO TABS: 100.0000 mg | ORAL_TABLET | Freq: Two times a day (BID) | ORAL | Status: DC

## 2013-05-15 MED ORDER — IPRATROPIUM BROMIDE 0.02 % IN SOLN: 0.5000 mg | Freq: Once | RESPIRATORY_TRACT | Status: DC

## 2013-05-15 MED ORDER — CLARITHROMYCIN 500 MG PO TABS: 500.0000 mg | ORAL_TABLET | Freq: Two times a day (BID) | ORAL | Status: DC

## 2013-05-15 MED ORDER — ALBUTEROL SULFATE (5 MG/ML) 0.5% IN NEBU
2.5000 mg | INHALATION_SOLUTION | Freq: Once | RESPIRATORY_TRACT | Status: AC
  Administered 2013-05-15: 2.5 mg via RESPIRATORY_TRACT

## 2013-05-15 NOTE — Assessment & Plan Note (Signed)
Pt continues to wheeze despite neb tx.  Will start abx, steroid taper, cough meds, and continue regular inhalers.  Reviewed supportive care and red flags that should prompt return.  Pt expressed understanding and is in agreement w/ plan.

## 2013-05-15 NOTE — Progress Notes (Signed)
  Subjective:    Patient ID: Ashley Woods, female    DOB: [birth year], 67 y.o.   MRN: [id number]  HPI URI- sxs started 2.5 weeks ago.  + nasal congestion, sinus pain, chest congestion, cough- particularly at night.  Cough is not productive.  Increased wheezing.  Using inhalers- Advair and albuterol.  No fevers.  No N/V/D.  No known sick contacts.  No ear pain.  + hoarseness.  Pt has hx of COPD, heavy smoking.   Review of Systems For ROS see HPI     Objective:   Physical Exam  Vitals reviewed. Constitutional: She appears well-developed and well-nourished. No distress.  HENT:  Head: Normocephalic and atraumatic.  Right Ear: Tympanic membrane normal.  Left Ear: Tympanic membrane normal.  Nose: Mucosal edema and rhinorrhea present. Right sinus exhibits maxillary sinus tenderness and frontal sinus tenderness. Left sinus exhibits maxillary sinus tenderness and frontal sinus tenderness.  Mouth/Throat: Uvula is midline and mucous membranes are normal. Posterior oropharyngeal erythema present. No oropharyngeal exudate.  Eyes: Conjunctivae and EOM are normal. Pupils are equal, round, and reactive to light.  Neck: Normal range of motion. Neck supple.  Cardiovascular: Normal rate, regular rhythm and normal heart sounds.   Pulmonary/Chest: Effort normal. No respiratory distress. She has wheezes (diffuse expiratory wheezes w/ rhonchi).  Wet, nonproductive cough  Lymphadenopathy:    She has no cervical adenopathy.          Assessment & Plan:

## 2013-05-15 NOTE — Patient Instructions (Signed)
Start the Biaxin- 2 tabs daily at the same time Start the Prednisone as directed Continue your inhalers Use the cough syrup as needed Call with any questions or concerns Hang in there!!!

## 2013-05-15 NOTE — Assessment & Plan Note (Signed)
Pt's sxs and PE consistent w/ infxn.  Start abx.  Reviewed supportive care and red flags that should prompt return.  Pt expressed understanding and is in agreement w/ plan.  

## 2013-06-05 ENCOUNTER — Telehealth: Payer: Self-pay | Admitting: Family Medicine

## 2013-06-05 MED ORDER — HYDROCODONE-ACETAMINOPHEN 5-325 MG PO TABS: 1.0000 | ORAL_TABLET | Freq: Four times a day (QID) | ORAL | Status: DC | PRN

## 2013-06-05 NOTE — Telephone Encounter (Signed)
Med filled, will call and notify pt that due to regulations we cannot fax in rx, or give refills. Will have to pick up.

## 2013-06-05 NOTE — Telephone Encounter (Signed)
Last OV 10-16 Med filled 7-16 #60 with 1  Moderate risk .

## 2013-06-05 NOTE — Telephone Encounter (Signed)
Patient is calling to request a refill on her HYDROcodone-acetaminophen (NORCO/VICODIN) 5-325 MG per tablet. She would like it called in to [facility] on [address] in [place]. Please advise.

## 2013-06-05 NOTE — Telephone Encounter (Signed)
Ok for #60, no refills based on new prescribing rules.  Also needs to pick up prescription

## 2013-06-09 ENCOUNTER — Other Ambulatory Visit: Payer: Self-pay | Admitting: Family Medicine

## 2013-06-09 NOTE — Telephone Encounter (Signed)
Med filled.  

## 2013-06-17 ENCOUNTER — Ambulatory Visit (INDEPENDENT_AMBULATORY_CARE_PROVIDER_SITE_OTHER): Payer: Medicare HMO | Admitting: Family Medicine

## 2013-06-17 ENCOUNTER — Encounter: Payer: Self-pay | Admitting: Family Medicine

## 2013-06-17 VITALS — BP 120/80 | HR 73 | Resp 16 | Wt 226.0 lb

## 2013-06-17 DIAGNOSIS — M25561 Pain in right knee: Secondary | ICD-10-CM | POA: Insufficient documentation

## 2013-06-17 DIAGNOSIS — G4733 Obstructive sleep apnea (adult) (pediatric): Secondary | ICD-10-CM

## 2013-06-17 DIAGNOSIS — M25569 Pain in unspecified knee: Secondary | ICD-10-CM

## 2013-06-17 MED ORDER — NAPROXEN 500 MG PO TABS: 500.0000 mg | ORAL_TABLET | Freq: Two times a day (BID) | ORAL | Status: DC

## 2013-06-17 NOTE — Assessment & Plan Note (Signed)
Pt would like referral to new provider at [facility]  Referral entered.

## 2013-06-17 NOTE — Assessment & Plan Note (Signed)
New.  Given point tenderness along antero-medial joint line suspect pt will need injxn.  Start scheduled NSAIDs in addition to hydrocodone and refer to ortho.  Reviewed supportive care and red flags that should prompt return.  Pt expressed understanding and is in agreement w/ plan.

## 2013-06-17 NOTE — Patient Instructions (Signed)
Follow up as needed We'll call you with your referrals Start the Naproxen twice daily- take w/ food Use the hydrocodone as needed for severe pain ICE the knee Call with any questions or concerns Hang in there!! Happy Thanksgiving!

## 2013-06-17 NOTE — Progress Notes (Signed)
  Subjective:    Patient ID: Ashley Woods, female    DOB: 03-07-1946, 67 y.o.   MRN: [id number]  HPI R knee pain- sxs started 6 days ago.  No known injury.  Able to bear weight but any sort of pivot causes pain along antero-medial knee.  Pain w/ extreme extension and flexion.  + swelling.  No relief w/ hydrocodone.  OSA- pt wants referral to Dr Larence Penning in [facility]   Review of Systems For ROS see HPI     Objective:   Physical Exam  Vitals reviewed. Constitutional: She appears well-developed and well-nourished. No distress.  Cardiovascular: Intact distal pulses.   Musculoskeletal: She exhibits edema (mild swelling of R anterior knee) and tenderness (TTP over R knee medial joint line).  Pain w/ R knee flexion, pivot, and at extreme extension  Neurological: She has normal reflexes. Coordination normal.  Skin: Skin is warm and dry.  Psychiatric: She has a normal mood and affect. Her behavior is normal.          Assessment & Plan:

## 2013-07-03 ENCOUNTER — Other Ambulatory Visit: Payer: Self-pay | Admitting: Family Medicine

## 2013-07-03 NOTE — Telephone Encounter (Signed)
Med filled and faxed.  

## 2013-07-03 NOTE — Telephone Encounter (Signed)
Last OV 06-17-13 Clonopin #45 with 1 03-17-13 Oxybutynin #30 with 5 refills 12-27-12

## 2013-07-31 HISTORY — PX: BLADDER SURGERY: SHX569

## 2013-08-06 ENCOUNTER — Telehealth: Payer: Self-pay | Admitting: *Deleted

## 2013-08-06 MED ORDER — HYDROCODONE-ACETAMINOPHEN 5-325 MG PO TABS: 1.0000 | ORAL_TABLET | Freq: Four times a day (QID) | ORAL | Status: DC | PRN

## 2013-08-06 NOTE — Telephone Encounter (Signed)
Patient called and requested a refill for HYDROcodone-acetaminophen (NORCO/VICODIN) 5-325 MG per tablet, also to give dr Birdie Riddle names of CP specialist Merril Abbe and Murriel Hopper at [facility]. Patient states that dr Birdie Riddle will know what she is talking about.

## 2013-08-06 NOTE — Telephone Encounter (Signed)
Last OV 06-17-13 Med filled 11-6- #60 with 0

## 2013-08-06 NOTE — Telephone Encounter (Signed)
Ok for #60, no refills 

## 2013-08-06 NOTE — Telephone Encounter (Signed)
Patient called and stated that she need referrals sent to these places.  1. [facility]   Fax [phone] attention anna dr Otilio Connors apt already scheduled on 08/13/2012 (hearing evaluation) 2. Dr Mitzi Hansen [place]ard (sleep study) apt already scheduled on  08/17/2012 Phone [phone] 3. [place] [phone] needs apt for this one.

## 2013-08-06 NOTE — Telephone Encounter (Signed)
Med filled and pt notified.  

## 2013-08-07 NOTE — Telephone Encounter (Signed)
Referral requests faxed to [facility]. Per Dr. Nyra Market office, we do not need to do pre-auth for sleep study. They will do that themselves. Awaiting approvals.

## 2013-08-13 ENCOUNTER — Other Ambulatory Visit: Payer: Self-pay | Admitting: Family Medicine

## 2013-08-13 NOTE — Telephone Encounter (Signed)
Med filled.  

## 2013-09-15 ENCOUNTER — Emergency Department (HOSPITAL_BASED_OUTPATIENT_CLINIC_OR_DEPARTMENT_OTHER): Payer: Medicare HMO

## 2013-09-15 ENCOUNTER — Ambulatory Visit (INDEPENDENT_AMBULATORY_CARE_PROVIDER_SITE_OTHER): Payer: Medicare HMO | Admitting: Physician Assistant

## 2013-09-15 ENCOUNTER — Encounter: Payer: Self-pay | Admitting: Physician Assistant

## 2013-09-15 ENCOUNTER — Encounter (HOSPITAL_BASED_OUTPATIENT_CLINIC_OR_DEPARTMENT_OTHER): Payer: Self-pay | Admitting: Emergency Medicine

## 2013-09-15 ENCOUNTER — Emergency Department (HOSPITAL_BASED_OUTPATIENT_CLINIC_OR_DEPARTMENT_OTHER)
Admission: EM | Admit: 2013-09-15 | Discharge: 2013-09-15 | Disposition: A | Discharge status: Home / Self Care | Payer: Medicare HMO | Attending: Emergency Medicine | Admitting: Emergency Medicine

## 2013-09-15 VITALS — BP 185/83 | HR 112 | Temp 101.4°F | Resp 16 | Ht 64.0 in | Wt 226.4 lb

## 2013-09-15 DIAGNOSIS — Z87891 Personal history of nicotine dependence: Secondary | ICD-10-CM | POA: Insufficient documentation

## 2013-09-15 DIAGNOSIS — Z8669 Personal history of other diseases of the nervous system and sense organs: Secondary | ICD-10-CM | POA: Insufficient documentation

## 2013-09-15 DIAGNOSIS — E785 Hyperlipidemia, unspecified: Secondary | ICD-10-CM | POA: Insufficient documentation

## 2013-09-15 DIAGNOSIS — N10 Acute tubulo-interstitial nephritis: Secondary | ICD-10-CM

## 2013-09-15 DIAGNOSIS — R309 Painful micturition, unspecified: Secondary | ICD-10-CM

## 2013-09-15 DIAGNOSIS — Z791 Long term (current) use of non-steroidal anti-inflammatories (NSAID): Secondary | ICD-10-CM | POA: Insufficient documentation

## 2013-09-15 DIAGNOSIS — R300 Dysuria: Secondary | ICD-10-CM

## 2013-09-15 DIAGNOSIS — R109 Unspecified abdominal pain: Secondary | ICD-10-CM | POA: Insufficient documentation

## 2013-09-15 DIAGNOSIS — Z860100 Personal history of colon polyps, unspecified: Secondary | ICD-10-CM | POA: Insufficient documentation

## 2013-09-15 DIAGNOSIS — M545 Low back pain, unspecified: Secondary | ICD-10-CM

## 2013-09-15 DIAGNOSIS — G8929 Other chronic pain: Secondary | ICD-10-CM

## 2013-09-15 DIAGNOSIS — F329 Major depressive disorder, single episode, unspecified: Secondary | ICD-10-CM | POA: Insufficient documentation

## 2013-09-15 DIAGNOSIS — R011 Cardiac murmur, unspecified: Secondary | ICD-10-CM | POA: Insufficient documentation

## 2013-09-15 DIAGNOSIS — J449 Chronic obstructive pulmonary disease, unspecified: Secondary | ICD-10-CM | POA: Insufficient documentation

## 2013-09-15 DIAGNOSIS — K219 Gastro-esophageal reflux disease without esophagitis: Secondary | ICD-10-CM | POA: Insufficient documentation

## 2013-09-15 DIAGNOSIS — F3289 Other specified depressive episodes: Secondary | ICD-10-CM | POA: Insufficient documentation

## 2013-09-15 DIAGNOSIS — Z872 Personal history of diseases of the skin and subcutaneous tissue: Secondary | ICD-10-CM | POA: Insufficient documentation

## 2013-09-15 DIAGNOSIS — N12 Tubulo-interstitial nephritis, not specified as acute or chronic: Secondary | ICD-10-CM | POA: Insufficient documentation

## 2013-09-15 DIAGNOSIS — M549 Dorsalgia, unspecified: Secondary | ICD-10-CM

## 2013-09-15 DIAGNOSIS — Z79899 Other long term (current) drug therapy: Secondary | ICD-10-CM | POA: Insufficient documentation

## 2013-09-15 DIAGNOSIS — I10 Essential (primary) hypertension: Secondary | ICD-10-CM | POA: Insufficient documentation

## 2013-09-15 DIAGNOSIS — G4730 Sleep apnea, unspecified: Secondary | ICD-10-CM | POA: Insufficient documentation

## 2013-09-15 DIAGNOSIS — J4489 Other specified chronic obstructive pulmonary disease: Secondary | ICD-10-CM | POA: Insufficient documentation

## 2013-09-15 LAB — BASIC METABOLIC PANEL
BUN: 11 mg/dL (ref 6–23)
CO2: 23 mEq/L (ref 19–32)
Calcium: 9.5 mg/dL (ref 8.4–10.5)
Chloride: 100 mEq/L (ref 96–112)
Creatinine, Ser: 0.8 mg/dL (ref 0.50–1.10)
GFR, EST AFRICAN AMERICAN: 86 mL/min — AB (ref >90)
GFR, EST NON AFRICAN AMERICAN: 75 mL/min — AB (ref >90)
Glucose, Bld: 111 mg/dL — ABNORMAL HIGH (ref 70–99)
POTASSIUM: 3.9 meq/L (ref 3.7–5.3)
Sodium: 138 mEq/L (ref 137–147)

## 2013-09-15 LAB — CBC WITH DIFFERENTIAL/PLATELET
BASOS PCT: 0 % (ref 0–1)
Basophils Absolute: 0 10*3/uL (ref 0.0–0.1)
EOS ABS: 0.1 10*3/uL (ref 0.0–0.7)
Eosinophils Relative: 1 % (ref 0–5)
HCT: 43.2 % (ref 36.0–46.0)
Hemoglobin: 14.5 g/dL (ref 12.0–15.0)
LYMPHS ABS: 1.6 10*3/uL (ref 0.7–4.0)
Lymphocytes Relative: 13 % (ref 12–46)
MCH: 32.4 pg (ref 26.0–34.0)
MCHC: 33.6 g/dL (ref 30.0–36.0)
MCV: 96.4 fL (ref 78.0–100.0)
MONO ABS: 1 10*3/uL (ref 0.1–1.0)
Monocytes Relative: 8 % (ref 3–12)
NEUTROS PCT: 78 % — AB (ref 43–77)
Neutro Abs: 9.7 10*3/uL — ABNORMAL HIGH (ref 1.7–7.7)
PLATELETS: 168 10*3/uL (ref 150–400)
RBC: 4.48 MIL/uL (ref 3.87–5.11)
RDW: 12.5 % (ref 11.5–15.5)
WBC: 12.4 10*3/uL — ABNORMAL HIGH (ref 4.0–10.5)

## 2013-09-15 LAB — URINALYSIS, ROUTINE W REFLEX MICROSCOPIC
BILIRUBIN URINE: NEGATIVE
Glucose, UA: >1000 mg/dL — AB
Ketones, ur: NEGATIVE mg/dL
Nitrite: NEGATIVE
Protein, ur: 30 mg/dL — AB
Specific Gravity, Urine: 1.014 (ref 1.005–1.030)
UROBILINOGEN UA: 1 mg/dL (ref 0.0–1.0)
pH: 6.5 (ref 5.0–8.0)

## 2013-09-15 LAB — POCT URINALYSIS DIPSTICK
BILIRUBIN UA: NEGATIVE
GLUCOSE UA: 500
Ketones, UA: NEGATIVE
NITRITE UA: NEGATIVE
Protein, UA: 100
Spec Grav, UA: 1.025
Urobilinogen, UA: 0.2
pH, UA: 6

## 2013-09-15 LAB — CG4 I-STAT (LACTIC ACID): LACTIC ACID, VENOUS: 1.97 mmol/L (ref 0.5–2.2)

## 2013-09-15 LAB — URINE MICROSCOPIC-ADD ON

## 2013-09-15 MED ORDER — HYDROCODONE-ACETAMINOPHEN 5-325 MG PO TABS: 1.0000 | ORAL_TABLET | Freq: Four times a day (QID) | ORAL | Status: DC | PRN

## 2013-09-15 MED ORDER — CEFTRIAXONE SODIUM 1 G IJ SOLR
1.0000 g | Freq: Once | INTRAMUSCULAR | Status: AC
  Administered 2013-09-15: 1 g via INTRAMUSCULAR
  Filled 2013-09-15: qty 10

## 2013-09-15 MED ORDER — CIPROFLOXACIN HCL 500 MG PO TABS: 500.0000 mg | ORAL_TABLET | Freq: Two times a day (BID) | ORAL | Status: DC

## 2013-09-15 MED ORDER — PHENAZOPYRIDINE HCL 200 MG PO TABS: 200.0000 mg | ORAL_TABLET | Freq: Three times a day (TID) | ORAL | Status: DC

## 2013-09-15 NOTE — ED Notes (Signed)
Pt states she has a bad uti and kidney infection states was seen by PA  at [facility] who sent here for further evaluation. Pt states she also is having some nausea

## 2013-09-15 NOTE — Progress Notes (Signed)
Patient presents to clinic today c/o <1 week of suprapubic pressure, urinary urgency, dysuria, and hematuria.  Patient states symptoms worsened a couple of days ago.  Patient is now having chills, nausea and left-sided flank pain.  Patient is unsure of fever.  Temperature at 101.4 in clinic today.  Patient denies history of pyelonephritis but endorses history of nephrolithiasis.  Patient states she feels very tired.  Patient begins shaking during the interview.  Past Medical History  Diagnosis Date  . Hypertension   . Hyperlipidemia   . GERD (gastroesophageal reflux disease)   . Colon polyps   . IBS (irritable bowel syndrome)   . Overactive bladder   . Depression   . Asthma   . Sleep apnea     uses cpap  . Rosacea   . Macular degeneration     Dr. Hoyle Sauer  . COPD (chronic obstructive pulmonary disease)   . Diverticulosis   . Hemorrhoids   . Colon polyp     adenomatous    Current Outpatient Prescriptions on File Prior to Visit  Medication Sig Dispense Refill  . albuterol (PROVENTIL) (2.5 MG/3ML) 0.083% nebulizer solution Take 3 mLs (2.5 mg total) by nebulization every 4 (four) hours as needed for wheezing or shortness of breath. Please dispense individual nebules  75 mL  3  . citalopram (CELEXA) 40 MG tablet TAKE ONE TABLET BY MOUTH ONCE DAILY  30 tablet  2  . clonazePAM (KLONOPIN) 0.5 MG tablet TAKE 1 & 1/2 TABLETS BY MOUTH AT BEDTIME AS NEEDED  45 tablet  3  . Fluticasone-Salmeterol (ADVAIR) 250-50 MCG/DOSE AEPB Inhale 1 puff into the lungs 2 (two) times daily.  60 each  6  . hydrOXYzine (ATARAX/VISTARIL) 25 MG tablet Take 1 tablet (25 mg total) by mouth 3 (three) times daily as needed for itching.  30 tablet  0  . lisinopril (PRINIVIL,ZESTRIL) 10 MG tablet Take one tablet by mouth daily  30 tablet  5  . naproxen (NAPROSYN) 500 MG tablet Take 1 tablet (500 mg total) by mouth 2 (two) times daily with a meal.  60 tablet  0  . omeprazole (PRILOSEC) 40 MG capsule Take 40 mg by mouth  daily.        Marland Kitchen oxybutynin (DITROPAN-XL) 10 MG 24 hr tablet TAKE ONE TABLET BY MOUTH EVERY DAILY  30 tablet  6  . simvastatin (ZOCOR) 40 MG tablet TAKE ONE TABLET BY MOUTH AT BEDTIME.  90 tablet  1  . tiZANidine (ZANAFLEX) 4 MG tablet Take 1 tablet (4 mg total) by mouth every 6 (six) hours as needed.  45 tablet  0   No current facility-administered medications on file prior to visit.    Allergies  Allergen Reactions  . Bactrim [Sulfamethoxazole-Tmp Ds] Nausea And Vomiting  . Sulfamethoxazole-Trimethoprim Nausea And Vomiting  . Tramadol Hcl     REACTION: nausea    Family History  Problem Relation Age of Onset  . Heart disease Daughter   . [place] White syndrome Daughter   . Colon cancer Neg Hx   . Inflammatory bowel disease Neg Hx     History   Social History  . Marital Status: Married    Spouse Name: N/A    Number of Children: 2  . Years of Education: N/A   Occupational History  . currently on disability    Social History Main Topics  . Smoking status: Former Smoker -- 0.40 packs/day for 45 years    Quit date: 04/30/2013  . Smokeless  tobacco: Never Used     Comment: former 1ppd x45 years.  . Alcohol Use: No  . Drug Use: No  . Sexual Activity: None   Other Topics Concern  . None   Social History Narrative   Husband John on disability.    Former housekeeper   1 year of college    Review of Systems - See HPI.  All other ROS are negative.  BP 185/83  Pulse 112  Temp(Src) 101.4 F (38.6 C) (Oral)  Resp 16  Ht 5\' 4"  (1.626 m)  Wt 226 lb 6 oz (102.683 kg)  BMI 38.84 kg/m2  SpO2 96%  Physical Exam  Vitals reviewed. Constitutional: She is oriented to person, place, and time.  Well developed, well nourished in moderate painful distress  HENT:  Head: Normocephalic and atraumatic.  Eyes: Conjunctivae are normal.  Neck: Neck supple.  Cardiovascular: Normal heart sounds and intact distal pulses.   Tachycardic.  II/VI SEM auscultated on examination.   Pulmonary/Chest: Effort normal and breath sounds normal. No respiratory distress. She has no wheezes. She has no rales. She exhibits no tenderness.  + mod-severe left-sided CVA tenderness  Abdominal: Soft. Bowel sounds are normal. She exhibits no distension and no mass. There is no rebound and no guarding.  Positive suprapubic tenderness to palpation.  Lymphadenopathy:    She has no cervical adenopathy.  Neurological: She is alert and oriented to person, place, and time.  Mild tremor of hands 2/2 chills  Skin: Skin is warm and dry.  Psychiatric: Affect normal.    Recent Results (from the past 2160 hour(s))  POCT URINALYSIS DIPSTICK     Status: Abnormal   Collection Time    09/15/13  1:22 PM      Result Value Ref Range   Color, UA orange     Clarity, UA murky     Glucose, UA 500     Bilirubin, UA neg     Ketones, UA neg     Spec Grav, UA 1.025     Blood, UA moderate     pH, UA 6.0     Protein, UA 100     Urobilinogen, UA 0.2     Nitrite, UA neg     Leukocytes, UA small (1+)      Assessment/Plan: Acute pyelonephritis Patient with elevated BP, tachycardia, fever at 101.4 and chills.  Urine dipstick positive for blood, glucose, protein and leukocytes.  + CVA tenderness.  Patient triaged to ER for IV antibiotics and further workup to rule/out sepsis.  Patient agrees to proceed to [place] ER.  ER was called to be made aware of patient.  Undiagnosed cardiac murmurs Patient triaged to ER for further evaluation giving concern for mod-severe pyelonephritis and sepsis.

## 2013-09-15 NOTE — Progress Notes (Signed)
Pre visit review using our clinic review tool, if applicable. No additional management support is needed unless otherwise documented below in the visit note/SLS  

## 2013-09-15 NOTE — Assessment & Plan Note (Signed)
Patient with elevated BP, tachycardia, fever at 101.4 and chills.  Urine dipstick positive for blood, glucose, protein and leukocytes.  + CVA tenderness.  Patient triaged to ER for IV antibiotics and further workup to rule/out sepsis.  Patient agrees to proceed to [place] ER.  ER was called to be made aware of patient.

## 2013-09-15 NOTE — ED Provider Notes (Signed)
CSN: [id number]     Arrival date & time 09/15/13  1415 History  This chart was scribed for Malvin Johns, MD by Maree Erie, ED Scribe. The patient was seen in room MH11/MH11. Patient's care was started at 3:21 PM.    Chief Complaint  Patient presents with  . uti   . Fever     Patient is a 68 y.o. female presenting with fever. The history is provided by the patient. No language interpreter was used.  Fever Associated symptoms: dysuria and nausea   Associated symptoms: no chest pain, no chills, no congestion, no cough, no diarrhea, no headaches, no rash, no rhinorrhea and no vomiting     HPI Comments: Ashley Woods is a 68 y.o. female who presents to the Emergency Department complaining of constant, waxing and waning suprapubic abdominal pain that began four days ago. She describes the pain as pressure and dull throbbing. She states that she has associated constant left flank pain, dysuria, hematuria, frequency and nausea. She also reports a subjective fever today. She was seen by a PA at [facility] prior to coming to the ED and he suggested coming here for IV antibiotics. She reports a history of recurrent UTIs and kidney stones. She denies a history of kidney infections. She has never been seen by a Urologist for the UTIs. She states that the current pain feels more similar to when she had kidney stones. She denies sharp pain in her flank or vomiting. She states that she is allergic to Naproxen and Bactrim.   Past Medical History  Diagnosis Date  . Hypertension   . Hyperlipidemia   . GERD (gastroesophageal reflux disease)   . Colon polyps   . IBS (irritable bowel syndrome)   . Overactive bladder   . Depression   . Asthma   . Sleep apnea     uses cpap  . Rosacea   . Macular degeneration     Dr. Hoyle Sauer  . COPD (chronic obstructive pulmonary disease)   . Diverticulosis   . Hemorrhoids   . Colon polyp     adenomatous   Past Surgical History  Procedure Laterality Date   . Shoulder surgery    . Hernia repair    . Cervical fusion    . Partial hysterectomy    . Oophorectomy    . Tonsillectomy     Family History  Problem Relation Age of Onset  . Heart disease Daughter   . [place] syndrome Daughter   . Colon cancer Neg Hx   . Inflammatory bowel disease Neg Hx    History  Substance Use Topics  . Smoking status: Former Smoker -- 0.40 packs/day for 45 years    Quit date: 04/30/2013  . Smokeless tobacco: Never Used     Comment: former 1ppd x45 years.  . Alcohol Use: No   OB History   Grav Para Term Preterm Abortions TAB SAB Ect Mult Living   2 2             Review of Systems  Constitutional: Positive for fever. Negative for chills, diaphoresis and fatigue.  HENT: Negative for congestion, rhinorrhea and sneezing.   Eyes: Negative.   Respiratory: Negative for cough, chest tightness and shortness of breath.   Cardiovascular: Negative for chest pain and leg swelling.  Gastrointestinal: Positive for nausea and abdominal pain. Negative for vomiting, diarrhea and blood in stool.  Genitourinary: Positive for dysuria, frequency, hematuria and flank pain. Negative for difficulty urinating.  Musculoskeletal: Negative for arthralgias and back pain.  Skin: Negative for rash.  Neurological: Negative for dizziness, speech difficulty, weakness, numbness and headaches.      Allergies  Bactrim; Sulfamethoxazole-trimethoprim; and Tramadol hcl  Home Medications   Current Outpatient Rx  Name  Route  Sig  Dispense  Refill  . albuterol (PROVENTIL) (2.5 MG/3ML) 0.083% nebulizer solution   Nebulization   Take 3 mLs (2.5 mg total) by nebulization every 4 (four) hours as needed for wheezing or shortness of breath. Please dispense individual nebules   75 mL   3   . ciprofloxacin (CIPRO) 500 MG tablet   Oral   Take 1 tablet (500 mg total) by mouth 2 (two) times daily. One po bid x 7 days   20 tablet   0   . citalopram (CELEXA) 40 MG tablet       TAKE ONE TABLET BY MOUTH ONCE DAILY   30 tablet   2   . clonazePAM (KLONOPIN) 0.5 MG tablet      TAKE 1 & 1/2 TABLETS BY MOUTH AT BEDTIME AS NEEDED   45 tablet   3   . Fluticasone-Salmeterol (ADVAIR) 250-50 MCG/DOSE AEPB   Inhalation   Inhale 1 puff into the lungs 2 (two) times daily.   60 each   6   . HYDROcodone-acetaminophen (NORCO/VICODIN) 5-325 MG per tablet   Oral   Take 1 tablet by mouth every 6 (six) hours as needed.   60 tablet   0   . hydrOXYzine (ATARAX/VISTARIL) 25 MG tablet   Oral   Take 1 tablet (25 mg total) by mouth 3 (three) times daily as needed for itching.   30 tablet   0   . lisinopril (PRINIVIL,ZESTRIL) 10 MG tablet      Take one tablet by mouth daily   30 tablet   5   . naproxen (NAPROSYN) 500 MG tablet   Oral   Take 1 tablet (500 mg total) by mouth 2 (two) times daily with a meal.   60 tablet   0   . omeprazole (PRILOSEC) 40 MG capsule   Oral   Take 40 mg by mouth daily.           Marland Kitchen oxybutynin (DITROPAN-XL) 10 MG 24 hr tablet      TAKE ONE TABLET BY MOUTH EVERY DAILY   30 tablet   6   . phenazopyridine (PYRIDIUM) 200 MG tablet   Oral   Take 1 tablet (200 mg total) by mouth 3 (three) times daily.   6 tablet   0   . simvastatin (ZOCOR) 40 MG tablet      TAKE ONE TABLET BY MOUTH AT BEDTIME.   90 tablet   1   . tiZANidine (ZANAFLEX) 4 MG tablet   Oral   Take 1 tablet (4 mg total) by mouth every 6 (six) hours as needed.   45 tablet   0    Triage Vitals: BP 163/58  Pulse 100  Temp(Src) 101.8 F (38.8 C) (Oral)  Resp 18  Ht 5\' 5"  (1.651 m)  Wt 225 lb (102.059 kg)  BMI 37.44 kg/m2  SpO2 98%  Physical Exam  Nursing note and vitals reviewed. Constitutional: She is oriented to person, place, and time. She appears well-developed and well-nourished.  HENT:  Head: Normocephalic and atraumatic.  Eyes: Pupils are equal, round, and reactive to light.  Neck: Normal range of motion. Neck supple.  Cardiovascular: Normal  rate, regular rhythm and normal heart  sounds.   Pulmonary/Chest: Effort normal and breath sounds normal. No respiratory distress. She has no wheezes. She has no rales. She exhibits no tenderness.  Abdominal: Soft. Bowel sounds are normal. There is tenderness in the suprapubic area. There is CVA tenderness (left). There is no rebound and no guarding.  Moderate tenderness to left flank and suprapubic area with left CVA tenderness.   Musculoskeletal: Normal range of motion. She exhibits no edema.  Lymphadenopathy:    She has no cervical adenopathy.  Neurological: She is alert and oriented to person, place, and time.  Skin: Skin is warm and dry. No rash noted.  Psychiatric: She has a normal mood and affect.    ED Course  Procedures (including critical care time)  DIAGNOSTIC STUDIES: Oxygen Saturation is 98% on room air, normal by my interpretation.    COORDINATION OF CARE: 3:25 PM -Will order CT abdomen, CBC, BMP, urine culture and blood culture. Patient verbalizes understanding and agrees with treatment plan.   Labs Review Results for orders placed during the hospital encounter of 09/15/13  URINALYSIS, ROUTINE W REFLEX MICROSCOPIC      Result Value Ref Range   Color, Urine YELLOW  YELLOW   APPearance CLOUDY (*) CLEAR   Specific Gravity, Urine 1.014  1.005 - 1.030   pH 6.5  5.0 - 8.0   Glucose, UA >1000 (*) NEGATIVE mg/dL   Hgb urine dipstick SMALL (*) NEGATIVE   Bilirubin Urine NEGATIVE  NEGATIVE   Ketones, ur NEGATIVE  NEGATIVE mg/dL   Protein, ur 30 (*) NEGATIVE mg/dL   Urobilinogen, UA 1.0  0.0 - 1.0 mg/dL   Nitrite NEGATIVE  NEGATIVE   Leukocytes, UA SMALL (*) NEGATIVE  URINE MICROSCOPIC-ADD ON      Result Value Ref Range   Squamous Epithelial / LPF RARE  RARE   WBC, UA 21-50  <3 WBC/hpf   RBC / HPF 3-6  <3 RBC/hpf   Bacteria, UA MANY (*) RARE  CBC WITH DIFFERENTIAL      Result Value Ref Range   WBC 12.4 (*) 4.0 - 10.5 K/uL   RBC 4.48  3.87 - 5.11 MIL/uL   Hemoglobin  14.5  12.0 - 15.0 g/dL   HCT 43.2  36.0 - 46.0 %   MCV 96.4  78.0 - 100.0 fL   MCH 32.4  26.0 - 34.0 pg   MCHC 33.6  30.0 - 36.0 g/dL   RDW 12.5  11.5 - 15.5 %   Platelets 168  150 - 400 K/uL   Neutrophils Relative % 78 (*) 43 - 77 %   Lymphocytes Relative 13  12 - 46 %   Monocytes Relative 8  3 - 12 %   Eosinophils Relative 1  0 - 5 %   Basophils Relative 0  0 - 1 %   Neutro Abs 9.7 (*) 1.7 - 7.7 K/uL   Lymphs Abs 1.6  0.7 - 4.0 K/uL   Monocytes Absolute 1.0  0.1 - 1.0 K/uL   Eosinophils Absolute 0.1  0.0 - 0.7 K/uL   Basophils Absolute 0.0  0.0 - 0.1 K/uL  BASIC METABOLIC PANEL      Result Value Ref Range   Sodium 138  137 - 147 mEq/L   Potassium 3.9  3.7 - 5.3 mEq/L   Chloride 100  96 - 112 mEq/L   CO2 23  19 - 32 mEq/L   Glucose, Bld 111 (*) 70 - 99 mg/dL   BUN 11  6 - 23 mg/dL  Creatinine, Ser 0.80  0.50 - 1.10 mg/dL   Calcium 9.5  8.4 - 10.5 mg/dL   GFR calc non Af Amer 75 (*) >90 mL/min   GFR calc Af Amer 86 (*) >90 mL/min  CG4 I-STAT (LACTIC ACID)      Result Value Ref Range   Lactic Acid, Venous 1.97  0.5 - 2.2 mmol/L   Ct Abdomen Pelvis Wo Contrast  09/15/2013   CLINICAL DATA:  Left-sided flank pain and hematuria  EXAM: CT ABDOMEN AND PELVIS WITHOUT CONTRAST  TECHNIQUE: Multidetector CT imaging of the abdomen and pelvis was performed following the standard protocol without intravenous contrast.  COMPARISON:  06/05/2012  FINDINGS: Lung bases are free of acute infiltrate or sizable effusion. The liver is diffusely fatty infiltrated. The gallbladder is been surgically removed. The spleen is within normal limits. The adrenal glands and pancreas are unremarkable. The right kidney is well visualized without evidence of renal calculi or obstructive change. Nonobstructing renal stones are noted on the left with mild hydronephrosis and perinephric stranding. Hydroureter is seen which continues distally to the level of the ureterovesical junction. No definitive stone is seen. These  changes may be related to edema from recently passed stone. Stable hypodensity is noted in the left kidney which has been previously shown to represent a cyst.  The bladder is well distended with non-opacified urine. No pelvic mass lesion or or sidewall abnormality is noted. The bony structures are within normal limits. The appendix is unremarkable.  IMPRESSION: Nonobstructing left renal calculi. The largest of these measures 4 mm in dimension and is increased in size from the prior exam.  Stable left renal cyst.  Obstructive changes on the left without definitive obstructing stone. This may be related to a poorly calcified stone or edema from recently passed calculus.   Electronically Signed   By: Inez Catalina M.D.   On: 09/15/2013 16:00    Imaging Review Ct Abdomen Pelvis Wo Contrast  09/15/2013   CLINICAL DATA:  Left-sided flank pain and hematuria  EXAM: CT ABDOMEN AND PELVIS WITHOUT CONTRAST  TECHNIQUE: Multidetector CT imaging of the abdomen and pelvis was performed following the standard protocol without intravenous contrast.  COMPARISON:  06/05/2012  FINDINGS: Lung bases are free of acute infiltrate or sizable effusion. The liver is diffusely fatty infiltrated. The gallbladder is been surgically removed. The spleen is within normal limits. The adrenal glands and pancreas are unremarkable. The right kidney is well visualized without evidence of renal calculi or obstructive change. Nonobstructing renal stones are noted on the left with mild hydronephrosis and perinephric stranding. Hydroureter is seen which continues distally to the level of the ureterovesical junction. No definitive stone is seen. These changes may be related to edema from recently passed stone. Stable hypodensity is noted in the left kidney which has been previously shown to represent a cyst.  The bladder is well distended with non-opacified urine. No pelvic mass lesion or or sidewall abnormality is noted. The bony structures are within  normal limits. The appendix is unremarkable.  IMPRESSION: Nonobstructing left renal calculi. The largest of these measures 4 mm in dimension and is increased in size from the prior exam.  Stable left renal cyst.  Obstructive changes on the left without definitive obstructing stone. This may be related to a poorly calcified stone or edema from recently passed calculus.   Electronically Signed   By: Inez Catalina M.D.   On: 09/15/2013 16:00    EKG Interpretation   None  MDM   Final diagnoses:  Pyelonephritis    Patient presents with urinary symptoms and fever. She has clinical symptoms consistent with pyelonephritis. She's otherwise well-appearing with no vomiting. She has no other suggestions of sepsis. Her lactate was normal. I feel that she can have a trial of outpatient treatment. She's given a dose of IV Rocephin in the ED. Blood culture and urine cultures worsen. She was started on Cipro and peridium. I did advise her if she has any worsening symptoms she needs to return to the emergency department right away.  I personally performed the services described in this documentation, which was scribed in my presence.  The recorded information has been reviewed and considered.    Malvin Johns, MD 09/15/13 [phone]

## 2013-09-15 NOTE — Patient Instructions (Signed)
Please proceed immediately to the [facility] at [place]. [place],  [id number].  I am concerned you have a kidney infection and we want to rule/out possibility of a blood stream infection.

## 2013-09-15 NOTE — Assessment & Plan Note (Signed)
Patient triaged to ER for further evaluation giving concern for mod-severe pyelonephritis and sepsis.

## 2013-09-15 NOTE — Discharge Instructions (Signed)

## 2013-09-17 ENCOUNTER — Telehealth: Payer: Self-pay

## 2013-09-17 LAB — URINE CULTURE
Colony Count: >=100000
Special Requests: NORMAL

## 2013-09-17 NOTE — Telephone Encounter (Signed)
   Spoke with pt was seen by the ED on Monday DX- Pyelonephritis was given IV fluids and Cipro, pt states still feeling fatigued , sweats and fever of 102. Wants to know if she needs to be seen or should she wait until the  antibiotics are finished. Pleas advise

## 2013-09-17 NOTE — Telephone Encounter (Signed)
The patient called and is hoping to speak with the CMA about her visit to the urgent care.  She states her fever is fluctuating, however, her pain is gone.  She states her urine does seem clearer.   I offered her an ov, but the pt declined, stating she wanted to speak with the [facility] first.   Pt callback - [phone]

## 2013-09-17 NOTE — Telephone Encounter (Signed)
Chart review, urine culture + for Escherichia coli, pansensitive, blood cultures negative. Continue with Cipro, please schedule a visit for tomorrow. ER if high fevers, severe pain or nausea.

## 2013-09-17 NOTE — Telephone Encounter (Signed)
Pt scheduled tomorrow

## 2013-09-18 ENCOUNTER — Ambulatory Visit: Payer: Medicare HMO | Admitting: Internal Medicine

## 2013-09-21 LAB — CULTURE, BLOOD (ROUTINE X 2)
CULTURE: NO GROWTH
Culture: NO GROWTH
SPECIAL REQUESTS: NORMAL
Special Requests: NORMAL

## 2013-09-26 ENCOUNTER — Other Ambulatory Visit: Payer: Self-pay | Admitting: Family Medicine

## 2013-09-26 NOTE — Telephone Encounter (Signed)
Med filled.  

## 2013-12-02 ENCOUNTER — Other Ambulatory Visit: Payer: Self-pay | Admitting: Family Medicine

## 2013-12-02 NOTE — Telephone Encounter (Signed)
Med filled.  

## 2013-12-05 ENCOUNTER — Other Ambulatory Visit: Payer: Self-pay | Admitting: General Practice

## 2013-12-05 ENCOUNTER — Ambulatory Visit (INDEPENDENT_AMBULATORY_CARE_PROVIDER_SITE_OTHER): Payer: Commercial Managed Care - HMO | Admitting: Family Medicine

## 2013-12-05 ENCOUNTER — Encounter: Payer: Self-pay | Admitting: Family Medicine

## 2013-12-05 VITALS — BP 124/84 | HR 69 | Temp 97.9°F | Resp 16 | Wt 205.1 lb

## 2013-12-05 DIAGNOSIS — M549 Dorsalgia, unspecified: Principal | ICD-10-CM

## 2013-12-05 DIAGNOSIS — G8929 Other chronic pain: Secondary | ICD-10-CM

## 2013-12-05 DIAGNOSIS — R109 Unspecified abdominal pain: Secondary | ICD-10-CM

## 2013-12-05 DIAGNOSIS — R350 Frequency of micturition: Secondary | ICD-10-CM

## 2013-12-05 DIAGNOSIS — R102 Pelvic and perineal pain: Secondary | ICD-10-CM | POA: Insufficient documentation

## 2013-12-05 LAB — POCT URINALYSIS DIPSTICK
BILIRUBIN UA: NEGATIVE
Blood, UA: NEGATIVE
Glucose, UA: NEGATIVE
Ketones, UA: NEGATIVE
LEUKOCYTES UA: NEGATIVE
NITRITE UA: NEGATIVE
Spec Grav, UA: 1.01
UROBILINOGEN UA: 0.2
pH, UA: 6

## 2013-12-05 LAB — CBC WITH DIFFERENTIAL/PLATELET
BASOS PCT: 0.6 % (ref 0.0–3.0)
Basophils Absolute: 0 10*3/uL (ref 0.0–0.1)
Eosinophils Absolute: 0.1 10*3/uL (ref 0.0–0.7)
Eosinophils Relative: 2 % (ref 0.0–5.0)
HEMATOCRIT: 42.4 % (ref 36.0–46.0)
Hemoglobin: 14.4 g/dL (ref 12.0–15.0)
LYMPHS ABS: 1.1 10*3/uL (ref 0.7–4.0)
Lymphocytes Relative: 25.2 % (ref 12.0–46.0)
MCHC: 33.9 g/dL (ref 30.0–36.0)
MCV: 95.4 fl (ref 78.0–100.0)
MONO ABS: 0.3 10*3/uL (ref 0.1–1.0)
Monocytes Relative: 7.1 % (ref 3.0–12.0)
Neutro Abs: 2.8 10*3/uL (ref 1.4–7.7)
Neutrophils Relative %: 65.1 % (ref 43.0–77.0)
PLATELETS: 172 10*3/uL (ref 150.0–400.0)
RBC: 4.45 Mil/uL (ref 3.87–5.11)
RDW: 13 % (ref 11.5–15.5)
WBC: 4.3 10*3/uL (ref 4.0–10.5)

## 2013-12-05 LAB — BASIC METABOLIC PANEL
BUN: 21 mg/dL (ref 6–23)
CHLORIDE: 106 meq/L (ref 96–112)
CO2: 28 mEq/L (ref 19–32)
Calcium: 9.6 mg/dL (ref 8.4–10.5)
Creatinine, Ser: 0.8 mg/dL (ref 0.4–1.2)
GFR: 71.74 mL/min (ref >60.00)
Glucose, Bld: 110 mg/dL — ABNORMAL HIGH (ref 70–99)
POTASSIUM: 3.8 meq/L (ref 3.5–5.1)
SODIUM: 141 meq/L (ref 135–145)

## 2013-12-05 MED ORDER — CIPROFLOXACIN HCL 500 MG PO TABS: 500.0000 mg | ORAL_TABLET | Freq: Two times a day (BID) | ORAL | Status: DC

## 2013-12-05 MED ORDER — HYDROCODONE-ACETAMINOPHEN 5-325 MG PO TABS: 1.0000 | ORAL_TABLET | Freq: Four times a day (QID) | ORAL | Status: DC | PRN

## 2013-12-05 NOTE — Progress Notes (Signed)
Pre visit review using our clinic review tool, if applicable. No additional management support is needed unless otherwise documented below in the visit note. 

## 2013-12-05 NOTE — Progress Notes (Signed)
   Subjective:    Patient ID: Ashley Woods, female    DOB: 1945/11/25, 68 y.o.   MRN: [id number]  HPI Urinary pressure- + suprapubic pressure, back pain.  'trying to figure out if it's back or kidney'.  sxs started 2 weeks ago.  Urinary incontinence- stopped OAB meds.  No dysuria.  Having urinary retention.  + nausea, no vomiting.   Review of Systems For ROS see HPI     Objective:   Physical Exam  Vitals reviewed. Constitutional: She is oriented to person, place, and time. She appears well-developed and well-nourished. No distress.  Abdominal: Soft. Bowel sounds are normal. She exhibits no distension. There is tenderness (suprapubic tenderness and L CVA tenderness). There is no rebound and no guarding.  Neurological: She is alert and oriented to person, place, and time.  Skin: Skin is warm and dry.  Psychiatric: She has a normal mood and affect. Her behavior is normal. Thought content normal.          Assessment & Plan:

## 2013-12-05 NOTE — Patient Instructions (Addendum)
Follow up as needed Start the Cipro twice daily for possible infection We'll call you with your urology appt Start AZO for bladder pain/spasm Drink plenty of fluids (wear a pad if needed) Call with any questions or concerns Hang in there! Happy Mother's Day!

## 2013-12-05 NOTE — Assessment & Plan Note (Signed)
New.  Despite normal UA will send urine for culture and start empiric abx due to recent pyelonephritis.  Will also refer to urology for evaluation and tx of OAB and recurrent infection.  Reviewed supportive care and red flags that should prompt return.  Pt expressed understanding and is in agreement w/ plan.

## 2013-12-08 ENCOUNTER — Other Ambulatory Visit: Payer: Self-pay | Admitting: General Practice

## 2013-12-08 LAB — URINE CULTURE

## 2013-12-08 MED ORDER — CEPHALEXIN 500 MG PO CAPS: 500.0000 mg | ORAL_CAPSULE | Freq: Two times a day (BID) | ORAL | Status: DC

## 2013-12-18 ENCOUNTER — Other Ambulatory Visit: Payer: Self-pay | Admitting: Family Medicine

## 2013-12-18 NOTE — Telephone Encounter (Signed)
Med filled.  

## 2013-12-25 ENCOUNTER — Encounter: Payer: Self-pay | Admitting: Family Medicine

## 2014-01-05 ENCOUNTER — Other Ambulatory Visit: Payer: Self-pay | Admitting: Family Medicine

## 2014-01-05 NOTE — Telephone Encounter (Signed)
Med filled.  

## 2014-01-07 ENCOUNTER — Other Ambulatory Visit: Payer: Self-pay | Admitting: Family Medicine

## 2014-01-08 ENCOUNTER — Telehealth: Payer: Self-pay | Admitting: Family Medicine

## 2014-01-08 ENCOUNTER — Encounter: Payer: Self-pay | Admitting: General Practice

## 2014-01-08 DIAGNOSIS — M549 Dorsalgia, unspecified: Principal | ICD-10-CM

## 2014-01-08 DIAGNOSIS — G8929 Other chronic pain: Secondary | ICD-10-CM

## 2014-01-08 MED ORDER — HYDROCODONE-ACETAMINOPHEN 5-325 MG PO TABS: 1.0000 | ORAL_TABLET | Freq: Four times a day (QID) | ORAL | Status: DC | PRN

## 2014-01-08 NOTE — Telephone Encounter (Signed)
Med filled and pt notified.  

## 2014-01-08 NOTE — Telephone Encounter (Signed)
Caller name:Tamaria Relation to pt: patient Call back number: [phone] Pharmacy:  Reason for call:  Patient called to request a refill for hydrocodone. Also patient stated that she will be in the area this afternoon and would like to pick her rx up. Please advise.

## 2014-01-08 NOTE — Telephone Encounter (Signed)
Last OV 12-05-13 (urine frequency) Pt has not been seen for CPE since 2013, last lipid 2013.   No upcoming appts

## 2014-01-08 NOTE — Telephone Encounter (Signed)
Last OV 12-05-13 (urine freq) Hydrocodone last filled 12-05-13 #60 with 0  Moderate risk, UDS due

## 2014-01-08 NOTE — Telephone Encounter (Signed)
[facility] for #60, due for UDS

## 2014-01-08 NOTE — Telephone Encounter (Signed)
[facility] for refill, needs to schedule CPE

## 2014-02-23 ENCOUNTER — Telehealth: Payer: Self-pay | Admitting: Family Medicine

## 2014-02-23 DIAGNOSIS — G8929 Other chronic pain: Secondary | ICD-10-CM

## 2014-02-23 DIAGNOSIS — L299 Pruritus, unspecified: Secondary | ICD-10-CM

## 2014-02-23 DIAGNOSIS — M549 Dorsalgia, unspecified: Secondary | ICD-10-CM

## 2014-02-23 MED ORDER — HYDROCODONE-ACETAMINOPHEN 5-325 MG PO TABS: 1.0000 | ORAL_TABLET | Freq: Four times a day (QID) | ORAL | Status: DC | PRN

## 2014-02-23 NOTE — Telephone Encounter (Signed)
Pt needs re-fill on HYDROcodone-acetaminophen (NORCO/VICODIN) 5-325 MG per tablet ° °

## 2014-02-23 NOTE — Telephone Encounter (Signed)
Last OV 12-05-13 Med filled 01-08-14 #60 with 0  Moderate risk, due for uds

## 2014-02-23 NOTE — Telephone Encounter (Signed)
#  60, no refills

## 2014-02-23 NOTE — Telephone Encounter (Signed)
Med filled.  

## 2014-03-12 ENCOUNTER — Other Ambulatory Visit: Payer: Self-pay | Admitting: Family Medicine

## 2014-03-12 NOTE — Telephone Encounter (Signed)
Med filled.  

## 2014-03-19 ENCOUNTER — Other Ambulatory Visit: Payer: Self-pay | Admitting: Family Medicine

## 2014-03-20 NOTE — Telephone Encounter (Signed)
Med filled, pt has cpe next month.  

## 2014-04-09 ENCOUNTER — Other Ambulatory Visit: Payer: Self-pay | Admitting: General Practice

## 2014-04-09 ENCOUNTER — Encounter: Payer: Self-pay | Admitting: Family Medicine

## 2014-04-09 ENCOUNTER — Ambulatory Visit (HOSPITAL_BASED_OUTPATIENT_CLINIC_OR_DEPARTMENT_OTHER)
Admission: RE | Admit: 2014-04-09 | Discharge: 2014-04-09 | Disposition: A | Discharge status: Home / Self Care | Payer: Medicare HMO | Source: Ambulatory Visit | Attending: Family Medicine | Admitting: Family Medicine

## 2014-04-09 ENCOUNTER — Ambulatory Visit (INDEPENDENT_AMBULATORY_CARE_PROVIDER_SITE_OTHER): Payer: Commercial Managed Care - HMO | Admitting: Family Medicine

## 2014-04-09 VITALS — BP 128/78 | HR 67 | Temp 98.0°F | Resp 16 | Wt 194.1 lb

## 2014-04-09 DIAGNOSIS — M25559 Pain in unspecified hip: Secondary | ICD-10-CM

## 2014-04-09 DIAGNOSIS — M25551 Pain in right hip: Secondary | ICD-10-CM

## 2014-04-09 MED ORDER — PREDNISONE 10 MG PO TABS: ORAL_TABLET | ORAL | Status: DC

## 2014-04-09 NOTE — Progress Notes (Signed)
Pre visit review using our clinic review tool, if applicable. No additional management support is needed unless otherwise documented below in the visit note. 

## 2014-04-09 NOTE — Progress Notes (Signed)
   Subjective:    Patient ID: Ashley Woods, female    DOB: 1946-02-13, 68 y.o.   MRN: [id number]  HPI Hip pain- pt fell out of bed ~1 month ago and landed on R hip.  Had intermittent pain prior to this but after falling, pain is worse.  Pain is radiating down anterolateral thigh.  Was cutting grass 2 days ago and fell again on R hip.  Now w/ nausea.  Now painful to squat, climb stairs.  Some pain w/ internal rotation, pain w/ weight bearing.  Described as a 'throbbing'.  Some relief w/ hydrocodone.   Review of Systems For ROS see HPI     Objective:   Physical Exam  Vitals reviewed. Constitutional: She is oriented to person, place, and time. She appears well-developed and well-nourished. No distress.  Cardiovascular: Intact distal pulses.   Musculoskeletal:  Full flexion of R hip, good external rotation, mild pain w/ internal rotation.  Pain w/ palpation over R greater trochanteric bursa  Neurological: She is alert and oriented to person, place, and time. Coordination (antalgic gait) abnormal.  Skin: Skin is warm and dry. No erythema.          Assessment & Plan:

## 2014-04-09 NOTE — Assessment & Plan Note (Signed)
New.  Given hx of falls will get xray.  Suspect this is R greater trochanteric bursitis.  Start pred taper for pain relief.  No leg length discrepancy indicative of of dislocation.  Reviewed supportive care and red flags that should prompt return.  Pt expressed understanding and is in agreement w/ plan.

## 2014-04-09 NOTE — Patient Instructions (Signed)
Go downstairs and get your xray before you leave Start the prednisone as directed- take w/ food Alternate heat/ice for pain relief If no improvement in the next 2 weeks, please call so we can refer you to ortho Call with any questions or concerns Hang in there!!!

## 2014-04-17 ENCOUNTER — Ambulatory Visit (INDEPENDENT_AMBULATORY_CARE_PROVIDER_SITE_OTHER): Payer: Commercial Managed Care - HMO | Admitting: Family Medicine

## 2014-04-17 ENCOUNTER — Encounter: Payer: Self-pay | Admitting: Family Medicine

## 2014-04-17 VITALS — BP 124/82 | HR 64 | Temp 97.9°F | Resp 16 | Ht 63.5 in | Wt 195.1 lb

## 2014-04-17 DIAGNOSIS — E538 Deficiency of other specified B group vitamins: Secondary | ICD-10-CM

## 2014-04-17 DIAGNOSIS — I10 Essential (primary) hypertension: Secondary | ICD-10-CM

## 2014-04-17 DIAGNOSIS — J45909 Unspecified asthma, uncomplicated: Secondary | ICD-10-CM

## 2014-04-17 DIAGNOSIS — R413 Other amnesia: Secondary | ICD-10-CM

## 2014-04-17 DIAGNOSIS — E785 Hyperlipidemia, unspecified: Secondary | ICD-10-CM

## 2014-04-17 DIAGNOSIS — E559 Vitamin D deficiency, unspecified: Secondary | ICD-10-CM

## 2014-04-17 DIAGNOSIS — Z0000 Encounter for general adult medical examination without abnormal findings: Secondary | ICD-10-CM

## 2014-04-17 DIAGNOSIS — Z780 Asymptomatic menopausal state: Secondary | ICD-10-CM

## 2014-04-17 DIAGNOSIS — Z1231 Encounter for screening mammogram for malignant neoplasm of breast: Secondary | ICD-10-CM

## 2014-04-17 DIAGNOSIS — I6529 Occlusion and stenosis of unspecified carotid artery: Secondary | ICD-10-CM

## 2014-04-17 LAB — VITAMIN D 25 HYDROXY (VIT D DEFICIENCY, FRACTURES): VITD: 31.32 ng/mL (ref 30.00–100.00)

## 2014-04-17 LAB — LIPID PANEL
CHOL/HDL RATIO: 2
CHOLESTEROL: 164 mg/dL (ref 0–200)
HDL: 66.6 mg/dL (ref >39.00)
LDL CALC: 68 mg/dL (ref 0–99)
NonHDL: 97.4
Triglycerides: 145 mg/dL (ref 0.0–149.0)
VLDL: 29 mg/dL (ref 0.0–40.0)

## 2014-04-17 LAB — B12 AND FOLATE PANEL: Vitamin B-12: 322 pg/mL (ref 211–911)

## 2014-04-17 LAB — CBC WITH DIFFERENTIAL/PLATELET
BASOS ABS: 0 10*3/uL (ref 0.0–0.1)
Basophils Relative: 0.3 % (ref 0.0–3.0)
EOS PCT: 0.7 % (ref 0.0–5.0)
Eosinophils Absolute: 0 10*3/uL (ref 0.0–0.7)
HCT: 44 % (ref 36.0–46.0)
Hemoglobin: 14.5 g/dL (ref 12.0–15.0)
LYMPHS PCT: 12.7 % (ref 12.0–46.0)
Lymphs Abs: 0.8 10*3/uL (ref 0.7–4.0)
MCHC: 33 g/dL (ref 30.0–36.0)
MCV: 96.6 fl (ref 78.0–100.0)
MONOS PCT: 5 % (ref 3.0–12.0)
Monocytes Absolute: 0.3 10*3/uL (ref 0.1–1.0)
NEUTROS PCT: 81.3 % — AB (ref 43.0–77.0)
Neutro Abs: 5.3 10*3/uL (ref 1.4–7.7)
PLATELETS: 193 10*3/uL (ref 150.0–400.0)
RBC: 4.55 Mil/uL (ref 3.87–5.11)
RDW: 12.7 % (ref 11.5–15.5)
WBC: 6.5 10*3/uL (ref 4.0–10.5)

## 2014-04-17 LAB — HEPATIC FUNCTION PANEL
ALBUMIN: 4.3 g/dL (ref 3.5–5.2)
ALK PHOS: 56 U/L (ref 39–117)
ALT: 20 U/L (ref 0–35)
AST: 18 U/L (ref 0–37)
Bilirubin, Direct: 0 mg/dL (ref 0.0–0.3)
TOTAL PROTEIN: 7 g/dL (ref 6.0–8.3)
Total Bilirubin: 0.5 mg/dL (ref 0.2–1.2)

## 2014-04-17 LAB — TSH: TSH: 3.24 u[IU]/mL (ref 0.35–4.50)

## 2014-04-17 LAB — BASIC METABOLIC PANEL
BUN: 21 mg/dL (ref 6–23)
CHLORIDE: 103 meq/L (ref 96–112)
CO2: 30 mEq/L (ref 19–32)
CREATININE: 0.8 mg/dL (ref 0.4–1.2)
Calcium: 9.3 mg/dL (ref 8.4–10.5)
GFR: 79.23 mL/min (ref >60.00)
GLUCOSE: 112 mg/dL — AB (ref 70–99)
POTASSIUM: 4.1 meq/L (ref 3.5–5.1)
Sodium: 140 mEq/L (ref 135–145)

## 2014-04-17 NOTE — Patient Instructions (Signed)
Follow up in 6 months to recheck BP and cholesterol We'll notify you of your lab results and make any changes if needed Keep up the good work on healthy diet and regular exercise We'll call you with your mammo, bone density, and carotid US appt If no obvious cause of memory issues on labs, will refer to neuro Call with any questions or concerns Happy Rudene Anda!!!

## 2014-04-17 NOTE — Progress Notes (Signed)
   Subjective:    Patient ID: Ashley Woods, female    DOB: 1946-05-03, 68 y.o.   MRN: [id number]  HPI Here today for CPE.  Risk Factors: HTN- chronic problem, on Lisinopril.  No CP, SOB, HAs, visual changes, edema Hyperlipidemia- chronic problem, on Simvastatin.  No abd pain, N/V, myalgias Asthma- chronic problem, previously diagnosed w/ COPD but Dr at [facility] told her it was asthma.  Albuterol prn.  Stopped her Advair Memory loss- husband is 'very concerned'.  Pt was unable to find her way to place she has been '100s of times before'.  'it's a weird feeling, i just get kinda foggy'.  Reports good days and bad days.  Hx of carotid artery stenosis.  Due for repeat imaging. Physical Activity: walking regularly Fall Risk: low risk Depression: chronic problem, on Celexa Hearing: decreased, wears hearing aides bilaterally ADL's: independent Cognitive: normal linear thought process, memory and attention intact Home Safety: safe at home, lives w/ husband Height, Weight, BMI, Visual Acuity: see vitals, vision corrected to 20/20 w/ glasses Counseling: UTD on colonoscopy, no need for paps.  Overdue on mammo- 'i need to do that'. Labs Ordered: See A&P Care Plan: See A&P    Review of Systems Patient reports no vision/ hearing changes, adenopathy,fever, weight change,  persistant/recurrent hoarseness , swallowing issues, chest pain, palpitations, edema, persistant/recurrent cough, hemoptysis, dyspnea (rest/exertional/paroxysmal nocturnal), gastrointestinal bleeding (melena, rectal bleeding), abdominal pain, significant heartburn, bowel changes, GU symptoms (dysuria, hematuria, incontinence), Gyn symptoms (abnormal  bleeding, pain),  syncope, focal weakness, numbness & tingling, skin/hair/nail changes, abnormal bruising or bleeding, anxiety, or depression.     Objective:   Physical Exam General Appearance:    Alert, cooperative, no distress, appears stated age  Head:    Normocephalic, without  obvious abnormality, atraumatic  Eyes:    PERRL, conjunctiva/corneas clear, EOM's intact, fundi    benign, both eyes  Ears:    Normal TM's and external ear canals, both ears  Nose:   Nares normal, septum midline, mucosa normal, no drainage    or sinus tenderness  Throat:   Lips, mucosa, and tongue normal; teeth and gums normal  Neck:   Supple, symmetrical, trachea midline, no adenopathy, no carotid bruits;    Thyroid: no enlargement/tenderness/nodules  Back:     Symmetric, no curvature, ROM normal, no CVA tenderness  Lungs:     Clear to auscultation bilaterally, respirations unlabored  Chest Wall:    No tenderness or deformity   Heart:    Regular rate and rhythm, S1 and S2 normal, no murmur, rub   or gallop  Breast Exam:    Deferred at pt's request  Abdomen:     Soft, non-tender, bowel sounds active all four quadrants,    no masses, no organomegaly  Genitalia:    Deferred at pt's request  Rectal:    Extremities:   Extremities normal, atraumatic, no cyanosis or edema  Pulses:   2+ and symmetric all extremities  Skin:   Skin color, texture, turgor normal, no rashes or lesions  Lymph nodes:   Cervical, supraclavicular, and axillary nodes normal  Neurologic:   CNII-XII intact, normal strength, sensation and reflexes    throughout          Assessment & Plan:

## 2014-04-17 NOTE — Progress Notes (Signed)
Pre visit review using our clinic review tool, if applicable. No additional management support is needed unless otherwise documented below in the visit note. 

## 2014-04-19 NOTE — Assessment & Plan Note (Signed)
Pt's PE WNL w/ exception of obesity.  UTD on colonoscopy, overdue on mammo- pt reports she will go, order entered.  Due for DEXA- order entered.  No need for paps due to hysterectomy.  Check labs.  Anticipatory guidance provided.

## 2014-04-19 NOTE — Assessment & Plan Note (Signed)
Hx of similar.  With pt's recent memory loss will check labs and replete prn.

## 2014-04-19 NOTE — Assessment & Plan Note (Signed)
Pt w/ hx of similar.  Check labs.  Replete prn. 

## 2014-04-19 NOTE — Assessment & Plan Note (Signed)
Chronic problem.  Adequate control.  Asymptomatic.  Check labs.  No anticipated med changes 

## 2014-04-19 NOTE — Assessment & Plan Note (Signed)
After visit w/ pulmonary at [facility], pt was told that she doesn't have COPD but instead has asthma.  Not requiring controller inhaler and rarely using albuterol.  Will continue to follow.

## 2014-04-19 NOTE — Assessment & Plan Note (Signed)
Pt w/ hx of similar but pt reports this is worsening- now losing her way when traveling to familiar places.  Husband very concerned.  Will get labs to assess for underlying metabolic cause.  If labs normal, will proceed w/ referral to neuro at [facility] expressed understanding and is in agreement w/ plan.

## 2014-04-19 NOTE — Assessment & Plan Note (Signed)
Pt w/ hx of this and has not had imaging in 5 yrs.  Will repeat imaging and determine whether intervention is needed

## 2014-04-19 NOTE — Assessment & Plan Note (Signed)
Chronic problem.  Tolerating statin w/o difficulty.  Check labs.  Adjust meds prn  

## 2014-04-20 ENCOUNTER — Other Ambulatory Visit: Payer: Self-pay | Admitting: Family Medicine

## 2014-04-20 ENCOUNTER — Telehealth: Payer: Self-pay | Admitting: Family Medicine

## 2014-04-20 DIAGNOSIS — R413 Other amnesia: Secondary | ICD-10-CM

## 2014-04-20 NOTE — Telephone Encounter (Signed)
Caller name: Bennett  Relation to pt: self  Call back number: [phone]   Reason for call: pt inquiring about blood results

## 2014-04-20 NOTE — Telephone Encounter (Signed)
Ashley Woods self [phone] Lemmie Evens)  Paulla called back since she had not heard from you, to see what her lab results were.

## 2014-04-21 NOTE — Telephone Encounter (Signed)
Pt.notified

## 2014-05-05 ENCOUNTER — Other Ambulatory Visit: Payer: Self-pay | Admitting: General Practice

## 2014-05-05 ENCOUNTER — Telehealth: Payer: Self-pay | Admitting: General Practice

## 2014-05-05 MED ORDER — CITALOPRAM HYDROBROMIDE 40 MG PO TABS: ORAL_TABLET | ORAL | Status: DC

## 2014-05-05 NOTE — Telephone Encounter (Signed)
Last ov 04-17-14 Clonazepam filled 03-12-14 #45 with 0

## 2014-05-05 NOTE — Telephone Encounter (Signed)
Ok for #45, no refills 

## 2014-05-06 MED ORDER — CLONAZEPAM 0.5 MG PO TABS: ORAL_TABLET | ORAL | Status: DC

## 2014-05-06 NOTE — Telephone Encounter (Signed)
Med filled.  

## 2014-05-14 ENCOUNTER — Telehealth: Payer: Self-pay | Admitting: Family Medicine

## 2014-05-14 DIAGNOSIS — G8929 Other chronic pain: Secondary | ICD-10-CM

## 2014-05-14 DIAGNOSIS — M549 Dorsalgia, unspecified: Principal | ICD-10-CM

## 2014-05-14 MED ORDER — HYDROCODONE-ACETAMINOPHEN 5-325 MG PO TABS: 1.0000 | ORAL_TABLET | Freq: Four times a day (QID) | ORAL | Status: DC | PRN

## 2014-05-14 NOTE — Telephone Encounter (Signed)
Med filled, pt notified and labs attached.

## 2014-05-14 NOTE — Telephone Encounter (Signed)
Ok for #60 

## 2014-05-14 NOTE — Telephone Encounter (Signed)
Caller name: Relation to pt: Call back Buckley:  Reason for call:  Needs a refill on rx HYDROcodone-acetaminophen (NORCO/VICODIN) 5-325 MG per  Also wants recent lab results printed and placed with Rx

## 2014-05-14 NOTE — Telephone Encounter (Signed)
Last OV 04-17-14 Hydrocodone last filled 02-23-14 #60 with 0  Moderate risk

## 2014-06-01 ENCOUNTER — Encounter: Payer: Self-pay | Admitting: Family Medicine

## 2014-06-11 ENCOUNTER — Other Ambulatory Visit: Payer: Self-pay | Admitting: Family Medicine

## 2014-06-11 NOTE — Telephone Encounter (Signed)
Last OV 04/17/14 Med last filled 05/06/14 #30 with 0

## 2014-06-12 ENCOUNTER — Other Ambulatory Visit: Payer: Self-pay | Admitting: General Practice

## 2014-06-12 MED ORDER — SIMVASTATIN 40 MG PO TABS: ORAL_TABLET | ORAL | Status: DC

## 2014-06-12 NOTE — Telephone Encounter (Signed)
Med filled and faxed.  

## 2014-07-14 ENCOUNTER — Other Ambulatory Visit: Payer: Self-pay | Admitting: Family Medicine

## 2014-07-14 NOTE — Telephone Encounter (Signed)
Med filled.  

## 2014-07-15 ENCOUNTER — Ambulatory Visit: Payer: Commercial Managed Care - HMO

## 2014-07-15 ENCOUNTER — Other Ambulatory Visit: Payer: Commercial Managed Care - HMO

## 2014-07-22 ENCOUNTER — Ambulatory Visit (INDEPENDENT_AMBULATORY_CARE_PROVIDER_SITE_OTHER): Payer: Commercial Managed Care - HMO

## 2014-07-22 ENCOUNTER — Encounter: Payer: Self-pay | Admitting: General Practice

## 2014-07-22 DIAGNOSIS — Z1231 Encounter for screening mammogram for malignant neoplasm of breast: Secondary | ICD-10-CM

## 2014-07-22 DIAGNOSIS — M859 Disorder of bone density and structure, unspecified: Secondary | ICD-10-CM

## 2014-07-22 DIAGNOSIS — Z780 Asymptomatic menopausal state: Secondary | ICD-10-CM

## 2014-07-31 HISTORY — PX: CATARACT EXTRACTION, BILATERAL: SHX1313

## 2014-08-04 ENCOUNTER — Telehealth: Payer: Self-pay

## 2014-08-04 ENCOUNTER — Other Ambulatory Visit (HOSPITAL_BASED_OUTPATIENT_CLINIC_OR_DEPARTMENT_OTHER): Payer: Commercial Managed Care - HMO

## 2014-08-04 ENCOUNTER — Other Ambulatory Visit: Payer: Self-pay | Admitting: General Practice

## 2014-08-04 ENCOUNTER — Ambulatory Visit (HOSPITAL_BASED_OUTPATIENT_CLINIC_OR_DEPARTMENT_OTHER)
Admission: RE | Admit: 2014-08-04 | Discharge: 2014-08-04 | Disposition: A | Discharge status: Home / Self Care | Payer: Commercial Managed Care - HMO | Source: Ambulatory Visit | Attending: Family Medicine | Admitting: Family Medicine

## 2014-08-04 ENCOUNTER — Telehealth: Payer: Self-pay | Admitting: Family Medicine

## 2014-08-04 DIAGNOSIS — I6523 Occlusion and stenosis of bilateral carotid arteries: Secondary | ICD-10-CM | POA: Diagnosis not present

## 2014-08-04 DIAGNOSIS — M549 Dorsalgia, unspecified: Principal | ICD-10-CM

## 2014-08-04 DIAGNOSIS — G8929 Other chronic pain: Secondary | ICD-10-CM

## 2014-08-04 DIAGNOSIS — I6529 Occlusion and stenosis of unspecified carotid artery: Secondary | ICD-10-CM

## 2014-08-04 MED ORDER — HYDROCODONE-ACETAMINOPHEN 5-325 MG PO TABS
1.0000 | ORAL_TABLET | Freq: Four times a day (QID) | ORAL | Status: DC | PRN
Start: 2014-08-04 — End: 2014-09-15

## 2014-08-04 NOTE — Telephone Encounter (Signed)
Shiah  [phone]  Edd Fabian returned your call

## 2014-08-04 NOTE — Telephone Encounter (Signed)
Pt notified of results

## 2014-08-04 NOTE — Telephone Encounter (Signed)
Ok for #60 

## 2014-08-04 NOTE — Telephone Encounter (Signed)
Med filled and placed at front desk. Pt notified.

## 2014-08-04 NOTE — Telephone Encounter (Signed)
Caller name:Abdalla, Kassondra Relation to DS:KAJG Call back Dawson:  Reason for call: pt is needing rx    HYDROcodone-acetaminophen (NORCO/VICODIN) 5-325    Pt states she has an appt today at 11:00 with the imaging center and if at all possible she would like to pick up the rx prior to leaving because she lives in [place] .

## 2014-08-04 NOTE — Telephone Encounter (Signed)
Last OV 04-17-14 Hydrocodone last filled 05-14-14 #60 with 0  Moderate risk

## 2014-08-08 DIAGNOSIS — G4733 Obstructive sleep apnea (adult) (pediatric): Secondary | ICD-10-CM | POA: Diagnosis not present

## 2014-08-12 ENCOUNTER — Other Ambulatory Visit: Payer: Self-pay | Admitting: Family Medicine

## 2014-08-12 NOTE — Telephone Encounter (Signed)
Med filled and faxed.  

## 2014-08-12 NOTE — Telephone Encounter (Signed)
Last OV 04-17-14 Clonazepam last filled 06-12-14 #45 with 0

## 2014-08-24 ENCOUNTER — Telehealth: Payer: Self-pay | Admitting: Family Medicine

## 2014-08-24 MED ORDER — LISINOPRIL 10 MG PO TABS: 10.0000 mg | ORAL_TABLET | Freq: Every day | ORAL | Status: DC

## 2014-08-24 NOTE — Telephone Encounter (Signed)
Caller name:Cappelli, Meadow Relationship to patient: Self Can be [phone] Pharmacy[facility] [address] in [place] Reason for call:  PT states that rx lisinopril (PRINIVIL,ZESTRIL) 10 MG tablet  Needs to be refilled- states [facility] has not rcvd the RX

## 2014-08-24 NOTE — Telephone Encounter (Signed)
Med filled.  

## 2014-08-26 ENCOUNTER — Other Ambulatory Visit: Payer: Self-pay | Admitting: General Practice

## 2014-09-02 DIAGNOSIS — R413 Other amnesia: Secondary | ICD-10-CM | POA: Diagnosis not present

## 2014-09-08 DIAGNOSIS — I639 Cerebral infarction, unspecified: Secondary | ICD-10-CM | POA: Diagnosis not present

## 2014-09-08 DIAGNOSIS — R413 Other amnesia: Secondary | ICD-10-CM | POA: Diagnosis not present

## 2014-09-08 DIAGNOSIS — G4733 Obstructive sleep apnea (adult) (pediatric): Secondary | ICD-10-CM | POA: Diagnosis not present

## 2014-09-15 ENCOUNTER — Encounter: Payer: Self-pay | Admitting: Physician Assistant

## 2014-09-15 ENCOUNTER — Telehealth: Payer: Self-pay | Admitting: Family Medicine

## 2014-09-15 ENCOUNTER — Ambulatory Visit (INDEPENDENT_AMBULATORY_CARE_PROVIDER_SITE_OTHER): Payer: Commercial Managed Care - HMO | Admitting: Physician Assistant

## 2014-09-15 ENCOUNTER — Ambulatory Visit (HOSPITAL_BASED_OUTPATIENT_CLINIC_OR_DEPARTMENT_OTHER)
Admission: RE | Admit: 2014-09-15 | Discharge: 2014-09-15 | Disposition: A | Discharge status: Home / Self Care | Payer: Commercial Managed Care - HMO | Source: Ambulatory Visit | Attending: Physician Assistant | Admitting: Physician Assistant

## 2014-09-15 VITALS — BP 152/58 | HR 74 | Temp 98.5°F | Resp 16 | Ht 63.5 in | Wt 208.0 lb

## 2014-09-15 DIAGNOSIS — M25521 Pain in right elbow: Secondary | ICD-10-CM | POA: Diagnosis not present

## 2014-09-15 DIAGNOSIS — M549 Dorsalgia, unspecified: Principal | ICD-10-CM

## 2014-09-15 DIAGNOSIS — S59911A Unspecified injury of right forearm, initial encounter: Secondary | ICD-10-CM

## 2014-09-15 DIAGNOSIS — G8929 Other chronic pain: Secondary | ICD-10-CM

## 2014-09-15 DIAGNOSIS — W19XXXA Unspecified fall, initial encounter: Secondary | ICD-10-CM | POA: Diagnosis not present

## 2014-09-15 DIAGNOSIS — S59901A Unspecified injury of right elbow, initial encounter: Secondary | ICD-10-CM | POA: Diagnosis not present

## 2014-09-15 DIAGNOSIS — M25531 Pain in right wrist: Secondary | ICD-10-CM | POA: Diagnosis not present

## 2014-09-15 DIAGNOSIS — S6991XA Unspecified injury of right wrist, hand and finger(s), initial encounter: Secondary | ICD-10-CM | POA: Diagnosis not present

## 2014-09-15 MED ORDER — HYDROCODONE-ACETAMINOPHEN 5-325 MG PO TABS: 1.0000 | ORAL_TABLET | Freq: Four times a day (QID) | ORAL | Status: DC | PRN

## 2014-09-15 NOTE — Telephone Encounter (Signed)
Caller name: Kyrie Relation to pt: self Call back number: Pharmacy:  Reason for call:   Requesting a new hydrocodone. Has appointment today at 11am and is wanting to pick up at that time.

## 2014-09-15 NOTE — Patient Instructions (Signed)
Please go downstairs for imaging. I will call you with results as soon as I have them  Please increase Hydrocodone to 10 mg every 8 hours over hte next couple of days.  Wean back to regular dosing once pain is improving.  Keep arm elevated while resting.  Apply ice to the arm.   Follow-up will be based on x-ray results.

## 2014-09-15 NOTE — Progress Notes (Signed)
Pre visit review using our clinic review tool, if applicable. No additional management support is needed unless otherwise documented below in the visit note/SLS  

## 2014-09-15 NOTE — Telephone Encounter (Signed)
Ok for #60 

## 2014-09-15 NOTE — Assessment & Plan Note (Signed)
X-rays negative for fracture.  Suspect contusion and strain. Supportive measures discussed with patient.  Increase hydrocodone to 10 mg every 8 hours until pain decreases.  Then resume regular dosing.

## 2014-09-15 NOTE — Telephone Encounter (Signed)
Med filled and given to Ashley Woods for pt.

## 2014-09-15 NOTE — Progress Notes (Signed)
Patient presents to clinic today c/o 3 days of R forearm/wrist pain and mild swelling after sustaining a [place] injury.  Denies head trauma or LOC.  Denies bruising of arm.  Endorses some difficulty with supination.  Pain radiated throughout forearm. Denies numbness or tingling of extremity. Has prior history of fracture.  Past Medical History  Diagnosis Date  . Hypertension   . Hyperlipidemia   . GERD (gastroesophageal reflux disease)   . Colon polyps   . IBS (irritable bowel syndrome)   . Overactive bladder   . Depression   . Asthma   . Sleep apnea     uses cpap  . Rosacea   . Macular degeneration     Dr. Hoyle Sauer  . COPD (chronic obstructive pulmonary disease)   . Diverticulosis   . Hemorrhoids   . Colon polyp     adenomatous    Current Outpatient Prescriptions on File Prior to Visit  Medication Sig Dispense Refill  . albuterol (PROVENTIL) (2.5 MG/3ML) 0.083% nebulizer solution Take 3 mLs (2.5 mg total) by nebulization every 4 (four) hours as needed for wheezing or shortness of breath. Please dispense individual nebules 75 mL 3  . citalopram (CELEXA) 40 MG tablet TAKE ONE TABLET BY MOUTH ONCE DAILY 30 tablet 1  . clonazePAM (KLONOPIN) 0.5 MG tablet TAKE ONE & ONE-HALF TABLETS BY MOUTH AT BEDTIME AS NEEDED 45 tablet 1  . lisinopril (PRINIVIL,ZESTRIL) 10 MG tablet Take 1 tablet (10 mg total) by mouth daily. 30 tablet 6  . omeprazole (PRILOSEC) 40 MG capsule Take 40 mg by mouth daily.      . simvastatin (ZOCOR) 40 MG tablet TAKE ONE TABLET BY MOUTH ONCE DAILY AT BEDTIME 90 tablet 0   No current facility-administered medications on file prior to visit.    Allergies  Allergen Reactions  . Bactrim [Sulfamethoxazole-Trimethoprim] Nausea And Vomiting  . Naproxen     Other reaction(s): Respiratory Distress (ALLERGY/intolerance)  . Sulfamethoxazole-Trimethoprim Nausea And Vomiting  . Tramadol     Other reaction(s): Vomiting (intolerance)  . Tramadol Hcl     REACTION: nausea     Family History  Problem Relation Age of Onset  . Heart disease Daughter   . [place] White syndrome Daughter   . Colon cancer Neg Hx   . Inflammatory bowel disease Neg Hx     History   Social History  . Marital Status: Married    Spouse Name: N/A  . Number of Children: 2  . Years of Education: N/A   Occupational History  . currently on disability    Social History Main Topics  . Smoking status: Former Smoker -- 0.40 packs/day for 45 years    Quit date: 04/30/2013  . Smokeless tobacco: Never Used     Comment: former 1ppd x45 years.  . Alcohol Use: No  . Drug Use: No  . Sexual Activity: Not on file   Other Topics Concern  . None   Social History Narrative   Husband John on disability.    Former housekeeper   1 year of college   Review of Systems - See HPI.  All other ROS are negative.  BP 152/58 mmHg  Pulse 74  Temp(Src) 98.5 F (36.9 C) (Oral)  Resp 16  Ht 5' 3.5" (1.613 m)  Wt 208 lb (94.348 kg)  BMI 36.26 kg/m2  SpO2 100%  Physical Exam  Constitutional: She is oriented to person, place, and time and well-developed, well-nourished, and in no distress.  HENT:  Head: Normocephalic and atraumatic.  Eyes: Conjunctivae are normal.  Cardiovascular: Normal rate, regular rhythm, normal heart sounds and intact distal pulses.   Pulmonary/Chest: Effort normal and breath sounds normal. No respiratory distress. She has no wheezes. She has no rales. She exhibits no tenderness.  Musculoskeletal:       Right wrist: She exhibits decreased range of motion and tenderness. She exhibits no bony tenderness and no swelling.       Right forearm: She exhibits tenderness. She exhibits no bony tenderness, no swelling and no edema.  Neurological: She is alert and oriented to person, place, and time.  Skin: Skin is warm and dry. No rash noted.  Psychiatric: Affect normal.  Vitals reviewed.  Assessment/Plan: Right forearm injury X-rays negative for fracture.  Suspect  contusion and strain. Supportive measures discussed with patient.  Increase hydrocodone to 10 mg every 8 hours until pain decreases.  Then resume regular dosing.

## 2014-09-17 ENCOUNTER — Other Ambulatory Visit: Payer: Self-pay | Admitting: Family Medicine

## 2014-09-17 NOTE — Telephone Encounter (Signed)
Med filled.  

## 2014-09-29 ENCOUNTER — Other Ambulatory Visit: Payer: Self-pay | Admitting: General Practice

## 2014-09-29 MED ORDER — LISINOPRIL 10 MG PO TABS: 10.0000 mg | ORAL_TABLET | Freq: Every day | ORAL | Status: DC

## 2014-09-29 MED ORDER — CITALOPRAM HYDROBROMIDE 40 MG PO TABS: 40.0000 mg | ORAL_TABLET | Freq: Every day | ORAL | Status: DC

## 2014-09-29 MED ORDER — SIMVASTATIN 40 MG PO TABS: 40.0000 mg | ORAL_TABLET | Freq: Every day | ORAL | Status: DC

## 2014-10-07 DIAGNOSIS — G4733 Obstructive sleep apnea (adult) (pediatric): Secondary | ICD-10-CM | POA: Diagnosis not present

## 2014-10-20 DIAGNOSIS — H43813 Vitreous degeneration, bilateral: Secondary | ICD-10-CM | POA: Diagnosis not present

## 2014-11-05 ENCOUNTER — Other Ambulatory Visit: Payer: Self-pay | Admitting: General Practice

## 2014-11-05 MED ORDER — LISINOPRIL 10 MG PO TABS: 10.0000 mg | ORAL_TABLET | Freq: Every day | ORAL | Status: DC

## 2014-11-05 MED ORDER — SIMVASTATIN 40 MG PO TABS: 40.0000 mg | ORAL_TABLET | Freq: Every day | ORAL | Status: DC

## 2014-11-05 MED ORDER — CITALOPRAM HYDROBROMIDE 40 MG PO TABS: 40.0000 mg | ORAL_TABLET | Freq: Every day | ORAL | Status: DC

## 2014-11-10 ENCOUNTER — Ambulatory Visit (INDEPENDENT_AMBULATORY_CARE_PROVIDER_SITE_OTHER): Payer: Commercial Managed Care - HMO | Admitting: Family Medicine

## 2014-11-10 ENCOUNTER — Encounter: Payer: Self-pay | Admitting: Family Medicine

## 2014-11-10 ENCOUNTER — Telehealth: Payer: Self-pay | Admitting: Family Medicine

## 2014-11-10 VITALS — BP 160/84 | HR 83 | Resp 16 | Wt 209.2 lb

## 2014-11-10 DIAGNOSIS — S59911D Unspecified injury of right forearm, subsequent encounter: Secondary | ICD-10-CM | POA: Diagnosis not present

## 2014-11-10 DIAGNOSIS — G8929 Other chronic pain: Secondary | ICD-10-CM | POA: Diagnosis not present

## 2014-11-10 DIAGNOSIS — I10 Essential (primary) hypertension: Secondary | ICD-10-CM | POA: Diagnosis not present

## 2014-11-10 DIAGNOSIS — M549 Dorsalgia, unspecified: Secondary | ICD-10-CM

## 2014-11-10 DIAGNOSIS — R7989 Other specified abnormal findings of blood chemistry: Secondary | ICD-10-CM | POA: Diagnosis not present

## 2014-11-10 DIAGNOSIS — E785 Hyperlipidemia, unspecified: Secondary | ICD-10-CM

## 2014-11-10 LAB — CBC WITH DIFFERENTIAL/PLATELET
Basophils Absolute: 0 10*3/uL (ref 0.0–0.1)
Basophils Relative: 0.5 % (ref 0.0–3.0)
EOS ABS: 0.1 10*3/uL (ref 0.0–0.7)
Eosinophils Relative: 1.8 % (ref 0.0–5.0)
HEMATOCRIT: 42.3 % (ref 36.0–46.0)
Hemoglobin: 14.4 g/dL (ref 12.0–15.0)
Lymphocytes Relative: 23.2 % (ref 12.0–46.0)
Lymphs Abs: 1.2 10*3/uL (ref 0.7–4.0)
MCHC: 34.1 g/dL (ref 30.0–36.0)
MCV: 94.5 fl (ref 78.0–100.0)
MONOS PCT: 7.9 % (ref 3.0–12.0)
Monocytes Absolute: 0.4 10*3/uL (ref 0.1–1.0)
NEUTROS PCT: 66.6 % (ref 43.0–77.0)
Neutro Abs: 3.3 10*3/uL (ref 1.4–7.7)
PLATELETS: 194 10*3/uL (ref 150.0–400.0)
RBC: 4.47 Mil/uL (ref 3.87–5.11)
RDW: 12.5 % (ref 11.5–15.5)
WBC: 5 10*3/uL (ref 4.0–10.5)

## 2014-11-10 LAB — BASIC METABOLIC PANEL
BUN: 16 mg/dL (ref 6–23)
CO2: 32 meq/L (ref 19–32)
CREATININE: 0.75 mg/dL (ref 0.40–1.20)
Calcium: 9.7 mg/dL (ref 8.4–10.5)
Chloride: 103 mEq/L (ref 96–112)
GFR: 81.54 mL/min (ref >60.00)
Glucose, Bld: 99 mg/dL (ref 70–99)
POTASSIUM: 4.1 meq/L (ref 3.5–5.1)
SODIUM: 139 meq/L (ref 135–145)

## 2014-11-10 LAB — HEPATIC FUNCTION PANEL
ALK PHOS: 63 U/L (ref 39–117)
ALT: 17 U/L (ref 0–35)
AST: 17 U/L (ref 0–37)
Albumin: 4.2 g/dL (ref 3.5–5.2)
BILIRUBIN TOTAL: 0.6 mg/dL (ref 0.2–1.2)
Bilirubin, Direct: 0.1 mg/dL (ref 0.0–0.3)
Total Protein: 6.8 g/dL (ref 6.0–8.3)

## 2014-11-10 LAB — TSH: TSH: 5.88 u[IU]/mL — AB (ref 0.35–4.50)

## 2014-11-10 LAB — LDL CHOLESTEROL, DIRECT: Direct LDL: 68 mg/dL

## 2014-11-10 LAB — LIPID PANEL
CHOLESTEROL: 170 mg/dL (ref 0–200)
HDL: 63 mg/dL (ref >39.00)
NonHDL: 107
TRIGLYCERIDES: 213 mg/dL — AB (ref 0.0–149.0)
Total CHOL/HDL Ratio: 3
VLDL: 42.6 mg/dL — ABNORMAL HIGH (ref 0.0–40.0)

## 2014-11-10 LAB — T3, FREE: T3 FREE: 2.7 pg/mL (ref 2.3–4.2)

## 2014-11-10 LAB — T4, FREE: Free T4: 0.74 ng/dL (ref 0.60–1.60)

## 2014-11-10 MED ORDER — LISINOPRIL 20 MG PO TABS: 20.0000 mg | ORAL_TABLET | Freq: Every day | ORAL | Status: DC

## 2014-11-10 MED ORDER — HYDROCODONE-ACETAMINOPHEN 5-325 MG PO TABS: 1.0000 | ORAL_TABLET | Freq: Four times a day (QID) | ORAL | Status: DC | PRN

## 2014-11-10 NOTE — Patient Instructions (Signed)
Follow up in 1 month to recheck BP and thyroid We'll notify you of your lab results and make any changes if needed Increase the Lisinopril to 20mg  daily- 2 of what you have at home and 1 of the new prescription We'll call you with your ortho appt for the arm pain Call with any questions or concerns Hang in there!!!

## 2014-11-10 NOTE — Telephone Encounter (Signed)
Caller name: Walsie Relation to pt: self Call back number: [phone] Pharmacy:  Reason for call:   Patient states that she does not want to go to [facility]. Patient is requesting to be referred to Dr. Smitty Cords in [place]. P: [phone] F: [phone]

## 2014-11-10 NOTE — Assessment & Plan Note (Signed)
Ongoing issue.  Due to lack of improvement, will refer to ortho at pt's request.

## 2014-11-10 NOTE — Progress Notes (Signed)
Pre visit review using our clinic review tool, if applicable. No additional management support is needed unless otherwise documented below in the visit note. 

## 2014-11-10 NOTE — Assessment & Plan Note (Signed)
New.  Pt's recent TSH from [facility] was 7.3 but she was not started on medication- was instructed to f/u here.  Pt is symptomatic but will repeat labs prior to starting levothyroxine.  Pt expressed understanding and is in agreement w/ plan.

## 2014-11-10 NOTE — Assessment & Plan Note (Signed)
Chronic problem.  Tolerating statin w/o difficulty.  Check labs.  Adjust meds prn  

## 2014-11-10 NOTE — Progress Notes (Signed)
   Subjective:    Patient ID: Ashley Woods, female    DOB: August 25, 1945, 69 y.o.   MRN: [id number]  HPI HTN- chronic problem, currently on Lisinopril 10mg  daily but BP is again elevated.  No CP, SOB, HAs.  Hyperlipidemia- chronic problem, on Simvastatin daily.  Denies abd pain, N/V, myalgias.  Elevated TSH- pt's TSH was elevated at [facility] on 09/15/14 to 7.3.  Was encouraged to have this rechecked w/ PCP.  Pt was asked by eye doctor if she had thyroid problems.  'i feel like crap'.  + fatigue, diarrhea.  R arm pain- had negative xrays on 2/16 after fall.  Pt reports she continues to have R forearm pain.  Pt has seen Dr Smitty Cords previously Jule Ser)   Review of Systems For ROS see HPI     Objective:   Physical Exam  Constitutional: She is oriented to person, place, and time. She appears well-developed and well-nourished. No distress.  HENT:  Head: Normocephalic and atraumatic.  Eyes: Conjunctivae and EOM are normal. Pupils are equal, round, and reactive to light.  Neck: Normal range of motion. Neck supple. No thyromegaly present.  Cardiovascular: Normal rate, regular rhythm, normal heart sounds and intact distal pulses.   No murmur heard. Pulmonary/Chest: Effort normal and breath sounds normal. No respiratory distress.  Abdominal: Soft. She exhibits no distension. There is no tenderness.  Musculoskeletal: She exhibits no edema.  Lymphadenopathy:    She has no cervical adenopathy.  Neurological: She is alert and oriented to person, place, and time.  Skin: Skin is warm and dry.  Psychiatric: She has a normal mood and affect. Her behavior is normal.  Vitals reviewed.         Assessment & Plan:

## 2014-11-10 NOTE — Assessment & Plan Note (Signed)
Deteriorated.  BP is not at goal.  Based on this, will increase Lisinopril to 20mg  daily.  Pt to return in 1 month to recheck BP and BMP.  Pt expressed understanding and is in agreement w/ plan.

## 2014-11-11 MED ORDER — LEVOTHYROXINE SODIUM 50 MCG PO TABS: 50.0000 ug | ORAL_TABLET | Freq: Every day | ORAL | Status: DC

## 2014-11-11 NOTE — Telephone Encounter (Signed)
Can you please see if pt can go here for her referral

## 2014-11-11 NOTE — Addendum Note (Signed)
Addended by: Peggyann Shoals on: 11/11/2014 01:55 PM   Modules accepted: Orders

## 2014-11-11 NOTE — Telephone Encounter (Signed)
Referral was faxed to Dr Smitty Cords, awaiting appt

## 2014-11-17 DIAGNOSIS — S56911A Strain of unspecified muscles, fascia and tendons at forearm level, right arm, initial encounter: Secondary | ICD-10-CM | POA: Diagnosis not present

## 2014-11-17 DIAGNOSIS — R52 Pain, unspecified: Secondary | ICD-10-CM | POA: Diagnosis not present

## 2014-11-17 DIAGNOSIS — M25521 Pain in right elbow: Secondary | ICD-10-CM | POA: Diagnosis not present

## 2014-11-30 ENCOUNTER — Other Ambulatory Visit: Payer: Self-pay | Admitting: Family Medicine

## 2014-11-30 NOTE — Telephone Encounter (Signed)
Last OV 11/10/14 (back Pain) Clonazepam 08/12/14 #45 with 1

## 2014-11-30 NOTE — Telephone Encounter (Signed)
Med filled faxed.

## 2014-12-10 ENCOUNTER — Other Ambulatory Visit: Payer: Self-pay | Admitting: General Practice

## 2014-12-10 ENCOUNTER — Ambulatory Visit (INDEPENDENT_AMBULATORY_CARE_PROVIDER_SITE_OTHER): Payer: Commercial Managed Care - HMO | Admitting: Family Medicine

## 2014-12-10 ENCOUNTER — Encounter: Payer: Self-pay | Admitting: Family Medicine

## 2014-12-10 VITALS — BP 132/84 | HR 82 | Temp 97.9°F | Resp 16 | Wt 212.4 lb

## 2014-12-10 DIAGNOSIS — M549 Dorsalgia, unspecified: Secondary | ICD-10-CM | POA: Diagnosis not present

## 2014-12-10 DIAGNOSIS — G8929 Other chronic pain: Secondary | ICD-10-CM

## 2014-12-10 DIAGNOSIS — I10 Essential (primary) hypertension: Secondary | ICD-10-CM

## 2014-12-10 DIAGNOSIS — M545 Low back pain, unspecified: Secondary | ICD-10-CM

## 2014-12-10 DIAGNOSIS — E039 Hypothyroidism, unspecified: Secondary | ICD-10-CM | POA: Insufficient documentation

## 2014-12-10 DIAGNOSIS — E038 Other specified hypothyroidism: Secondary | ICD-10-CM

## 2014-12-10 LAB — BASIC METABOLIC PANEL
BUN: 20 mg/dL (ref 6–23)
CO2: 30 meq/L (ref 19–32)
CREATININE: 0.71 mg/dL (ref 0.40–1.20)
Calcium: 9.5 mg/dL (ref 8.4–10.5)
Chloride: 104 mEq/L (ref 96–112)
GFR: 86.84 mL/min (ref >60.00)
Glucose, Bld: 156 mg/dL — ABNORMAL HIGH (ref 70–99)
Potassium: 3.5 mEq/L (ref 3.5–5.1)
Sodium: 139 mEq/L (ref 135–145)

## 2014-12-10 LAB — TSH: TSH: 3.93 u[IU]/mL (ref 0.35–4.50)

## 2014-12-10 MED ORDER — LEVOTHYROXINE SODIUM 75 MCG PO TABS: 75.0000 ug | ORAL_TABLET | Freq: Every day | ORAL | Status: DC

## 2014-12-10 MED ORDER — HYDROCODONE-ACETAMINOPHEN 5-325 MG PO TABS: 1.0000 | ORAL_TABLET | Freq: Four times a day (QID) | ORAL | Status: DC | PRN

## 2014-12-10 NOTE — Assessment & Plan Note (Signed)
New dx at last visit.  Pt reports some of the fatigue and hunger has subsided after starting levothyroxine but feels the dose may need to be adjusted.  Check labs and adjust meds accordingly.

## 2014-12-10 NOTE — Progress Notes (Signed)
Pre visit review using our clinic review tool, if applicable. No additional management support is needed unless otherwise documented below in the visit note. 

## 2014-12-10 NOTE — Assessment & Plan Note (Signed)
Chronic problem.  BP is much improved since increasing Lisinopril dose.  Check BMP to assess Cr and K+.  Will continue to follow at future visits.

## 2014-12-10 NOTE — Patient Instructions (Signed)
Schedule your complete physical for after 9/18 We'll notify you of your lab results and make any changes if needed Keep up the good work!  You look great! Call with any questions or concerns Have a great summer!

## 2014-12-10 NOTE — Progress Notes (Signed)
   Subjective:    Patient ID: Ashley Woods, female    DOB: 01/21/1946, 69 y.o.   MRN: [id number]  HPI HTN- chronic problem, last visit BP was elevated and Lisinopril was increased to 20mg  daily.  BP improved from 160/84--> 132/84.  Pt reports feeling 'a little better' but remains fatigued.  No CP, SOB, HAs, visual changes.  Hypothyroid- new dx at last visit.  Started on Synthroid 66mcg at last visit.  Pt reports continued fatigue, increased hunger.  Review of Systems For ROS see HPI     Objective:   Physical Exam  Constitutional: She is oriented to person, place, and time. She appears well-developed and well-nourished. No distress.  HENT:  Head: Normocephalic and atraumatic.  Eyes: Conjunctivae and EOM are normal. Pupils are equal, round, and reactive to light.  Neck: Normal range of motion. Neck supple. No thyromegaly present.  Cardiovascular: Normal rate, regular rhythm, normal heart sounds and intact distal pulses.   No murmur heard. Pulmonary/Chest: Effort normal and breath sounds normal. No respiratory distress.  Abdominal: Soft. She exhibits no distension. There is no tenderness.  Musculoskeletal: She exhibits no edema.  Lymphadenopathy:    She has no cervical adenopathy.  Neurological: She is alert and oriented to person, place, and time.  Skin: Skin is warm and dry.  Psychiatric: She has a normal mood and affect. Her behavior is normal.  Vitals reviewed.         Assessment & Plan:

## 2014-12-10 NOTE — Assessment & Plan Note (Signed)
At end of visit, pt requested refill on Hydrocodone.  She typically does not use 60 pills in 1 month so I asked if something had changed.  She said that she increased her medication use when she injured her arm recently.  She is aware that medication is only to be used as needed for severe pain.  Refill provided.

## 2014-12-11 ENCOUNTER — Telehealth: Payer: Self-pay | Admitting: Family Medicine

## 2014-12-11 MED ORDER — LEVOTHYROXINE SODIUM 75 MCG PO TABS: 75.0000 ug | ORAL_TABLET | Freq: Every day | ORAL | Status: DC

## 2014-12-11 NOTE — Telephone Encounter (Signed)
Med filled.  

## 2014-12-11 NOTE — Telephone Encounter (Signed)
Callertrinita, devlin Ph#: [phone] [facility]acy: [facility] [address] states that levothyroxine (SYNTHROID, LEVOTHROID) 75 MCG tablet needs sent to local pharmacy for a week supply or so. She is currently out of medication and mail order pharmarcy usually takes a week to get to her. Also asked about her lab results and dosage change.

## 2014-12-18 ENCOUNTER — Encounter: Payer: Self-pay | Admitting: Physician Assistant

## 2014-12-18 ENCOUNTER — Ambulatory Visit (INDEPENDENT_AMBULATORY_CARE_PROVIDER_SITE_OTHER): Payer: Commercial Managed Care - HMO | Admitting: Physician Assistant

## 2014-12-18 VITALS — BP 143/61 | HR 80 | Temp 98.2°F | Ht 63.5 in | Wt 213.2 lb

## 2014-12-18 DIAGNOSIS — N3001 Acute cystitis with hematuria: Secondary | ICD-10-CM

## 2014-12-18 DIAGNOSIS — N3000 Acute cystitis without hematuria: Secondary | ICD-10-CM | POA: Diagnosis not present

## 2014-12-18 LAB — POCT URINALYSIS DIPSTICK
Bilirubin, UA: NEGATIVE
GLUCOSE UA: NEGATIVE
Ketones, UA: NEGATIVE
Nitrite, UA: NEGATIVE
UROBILINOGEN UA: 0.2
pH, UA: 6

## 2014-12-18 MED ORDER — CIPROFLOXACIN HCL 500 MG PO TABS
500.0000 mg | ORAL_TABLET | Freq: Two times a day (BID) | ORAL | Status: DC
Start: 2014-12-18 — End: 2014-12-18

## 2014-12-18 MED ORDER — CIPROFLOXACIN HCL 500 MG PO TABS: 500.0000 mg | ORAL_TABLET | Freq: Two times a day (BID) | ORAL | Status: DC

## 2014-12-18 NOTE — Patient Instructions (Signed)
Your symptoms are consistent with a bladder infection, also called acute cystitis. Please take your antibiotic (Cipro) as directed until all pills are gone.  Stay very well hydrated.  Consider a daily probiotic (Align, Culturelle, or Activia) to help prevent stomach upset caused by the antibiotic.  Taking a probiotic daily may also help prevent recurrent UTIs.  Also consider taking AZO (Phenazopyridine) tablets to help decrease pain with urination.  I will call you with your urine testing results.  We will change antibiotics if indicated.  Call or return to clinic if symptoms are not resolved by completion of antibiotic.   Urinary Tract Infection A urinary tract infection (UTI) can occur any place along the urinary tract. The tract includes the kidneys, ureters, bladder, and urethra. A type of germ called bacteria often causes a UTI. UTIs are often helped with antibiotic medicine.  HOME CARE   If given, take antibiotics as told by your doctor. Finish them even if you start to feel better.  Drink enough fluids to keep your pee (urine) clear or pale yellow.  Avoid tea, drinks with caffeine, and bubbly (carbonated) drinks.  Pee often. Avoid holding your pee in for a long time.  Pee before and after having sex (intercourse).  Wipe from front to back after you poop (bowel movement) if you are a woman. Use each tissue only once. GET HELP RIGHT AWAY IF:   You have back pain.  You have lower belly (abdominal) pain.  You have chills.  You feel sick to your stomach (nauseous).  You throw up (vomit).  Your burning or discomfort with peeing does not go away.  You have a fever.  Your symptoms are not better in 3 days. MAKE SURE YOU:   Understand these instructions.  Will watch your condition.  Will get help right away if you are not doing well or get worse. Document Released: 01/03/2008 Document Revised: 04/10/2012 Document Reviewed: 02/15/2012 Exit[employer] Patient Information 2015  [employer]. This information is not intended to replace advice given to you by your health care provider. Make sure you discuss any questions you have with your health care provider.    

## 2014-12-18 NOTE — Progress Notes (Signed)
History of Present Illness: Ashley Woods is a 69 y.o. female who present to the clinic today complaining of urinary frequency, urinary urgency, dysuria and hematuria x 3 days.  Patient endorses suprapubic discomfort and low back pain.  Patient denies fever, chills, nausea/vomiting and flank pain.   History: Past Medical History  Diagnosis Date  . Hypertension   . Hyperlipidemia   . GERD (gastroesophageal reflux disease)   . Colon polyps   . IBS (irritable bowel syndrome)   . Overactive bladder   . Depression   . Asthma   . Sleep apnea     uses cpap  . Rosacea   . Macular degeneration     Dr. Hoyle Sauer  . COPD (chronic obstructive pulmonary disease)   . Diverticulosis   . Hemorrhoids   . Colon polyp     adenomatous    Current outpatient prescriptions:  .  albuterol (PROVENTIL) (2.5 MG/3ML) 0.083% nebulizer solution, Take 3 mLs (2.5 mg total) by nebulization every 4 (four) hours as needed for wheezing or shortness of breath. Please dispense individual nebules, Disp: 75 mL, Rfl: 3 .  aspirin 81 MG tablet, , Disp: , Rfl:  .  citalopram (CELEXA) 40 MG tablet, Take 1 tablet (40 mg total) by mouth daily., Disp: 90 tablet, Rfl: 0 .  clonazePAM (KLONOPIN) 0.5 MG tablet, TAKE ONE & ONE-HALF TABLETS BY MOUTH AT BEDTIME AS NEEDED, Disp: 45 tablet, Rfl: 0 .  HYDROcodone-acetaminophen (NORCO/VICODIN) 5-325 MG per tablet, Take 1 tablet by mouth every 6 (six) hours as needed., Disp: 60 tablet, Rfl: 0 .  levothyroxine (SYNTHROID, LEVOTHROID) 75 MCG tablet, Take 1 tablet (75 mcg total) by mouth daily., Disp: 30 tablet, Rfl: 1 .  lisinopril (PRINIVIL,ZESTRIL) 20 MG tablet, Take 1 tablet (20 mg total) by mouth daily., Disp: 90 tablet, Rfl: 3 .  simvastatin (ZOCOR) 40 MG tablet, Take 1 tablet (40 mg total) by mouth at bedtime., Disp: 90 tablet, Rfl: 1 .  ciprofloxacin (CIPRO) 500 MG tablet, Take 1 tablet (500 mg total) by mouth 2 (two) times daily., Disp: 10 tablet, Rfl: 0 .  omeprazole  (PRILOSEC) 40 MG capsule, Take 40 mg by mouth daily.  , Disp: , Rfl:  Allergies  Allergen Reactions  . Bactrim [Sulfamethoxazole-Trimethoprim] Nausea And Vomiting  . Naproxen Nausea And Vomiting and Shortness Of Breath    Other reaction(s): Respiratory Distress (ALLERGY/intolerance) Other reaction(s): Respiratory Distress (ALLERGY/intolerance)  . Sulfamethoxazole-Trimethoprim Nausea And Vomiting  . Tramadol     Other reaction(s): Vomiting (intolerance)  . Tramadol Hcl     REACTION: nausea   Family History  Problem Relation Age of Onset  . Heart disease Daughter   . [place] White syndrome Daughter   . Colon cancer Neg Hx   . Inflammatory bowel disease Neg Hx    History   Social History  . Marital Status: Married    Spouse Name: N/A  . Number of Children: 2  . Years of Education: N/A   Occupational History  . currently on disability    Social History Main Topics  . Smoking status: Former Smoker -- 0.40 packs/day for 45 years    Quit date: 04/30/2013  . Smokeless tobacco: Never Used     Comment: former 1ppd x45 years.  . Alcohol Use: No  . Drug Use: No  . Sexual Activity: Not on file   Other Topics Concern  . None   Social History Narrative   Husband John on disability.    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  All other ROS are negative.  Physical Examination: BP 143/61 mmHg  Pulse 80  Temp(Src) 98.2 F (36.8 C) (Oral)  Ht 5' 3.5" (1.613 m)  Wt 213 lb 3.2 oz (96.707 kg)  BMI 37.17 kg/m2  SpO2 99%  General appearance: alert, cooperative, appears stated age and no distress Head: Normocephalic, without obvious abnormality, atraumatic Lungs: clear to auscultation bilaterally Heart: regular rate and rhythm, S1, S2 normal, no murmur, click, rub or gallop Abdomen: soft, non-tender; bowel sounds normal; no masses,  no organomegaly and no CVA tenderness.  Labs/Diagnostics: Urinalysis    Component Value Date/Time    COLORURINE YELLOW 09/15/2013 1432   APPEARANCEUR CLOUDY* 09/15/2013 1432   LABSPEC 1.014 09/15/2013 1432   PHURINE 6.5 09/15/2013 1432   GLUCOSEU >1000* 09/15/2013 1432   HGBUR SMALL* 09/15/2013 1432   BILIRUBINUR neg 12/18/2014 1642   BILIRUBINUR NEGATIVE 09/15/2013 1432   BILIRUBINUR negative 01/15/2013 1036   BILIRUBINUR negative 01/15/2013 1036   KETONESUR NEGATIVE 09/15/2013 1432   PROTEINUR 1+ 12/18/2014 1642   PROTEINUR 30* 09/15/2013 1432   UROBILINOGEN 0.2 12/18/2014 1642   UROBILINOGEN 1.0 09/15/2013 1432   NITRITE neg 12/18/2014 1642   NITRITE NEGATIVE 09/15/2013 1432   NITRITE Negative 01/15/2013 1036   LEUKOCYTESUR large (3+) 12/18/2014 1642    Assessment/Plan: Acute cystitis with hematuria Urine dip with evidence large LE and blood.  Classic UTI symptoms.  Rx Cipro 500 mg BID x 5 days.  Urine sent for culture.  Supportive measures discussed. Will alter regimen based on culture results.

## 2014-12-18 NOTE — Progress Notes (Signed)
Pre visit review using our clinic review tool, if applicable. No additional management support is needed unless otherwise documented below in the visit note. 

## 2014-12-20 DIAGNOSIS — N3001 Acute cystitis with hematuria: Secondary | ICD-10-CM | POA: Insufficient documentation

## 2014-12-20 NOTE — Assessment & Plan Note (Signed)
Urine dip with evidence large LE and blood.  Classic UTI symptoms.  Rx Cipro 500 mg BID x 5 days.  Urine sent for culture.  Supportive measures discussed. Will alter regimen based on culture results.

## 2014-12-21 LAB — URINE CULTURE

## 2015-01-22 ENCOUNTER — Telehealth: Payer: Self-pay | Admitting: Family Medicine

## 2015-01-22 DIAGNOSIS — I10 Essential (primary) hypertension: Secondary | ICD-10-CM

## 2015-01-22 MED ORDER — LISINOPRIL 20 MG PO TABS: 20.0000 mg | ORAL_TABLET | Freq: Every day | ORAL | Status: DC

## 2015-01-22 NOTE — Telephone Encounter (Signed)
Caller name: Avigayil Relation to pt: Call back number: [phone] [facility]  Reason for call:   Requesting lisinopril 20mg  refill

## 2015-01-22 NOTE — Telephone Encounter (Signed)
Med filled.  

## 2015-03-03 ENCOUNTER — Telehealth: Payer: Self-pay | Admitting: Family Medicine

## 2015-03-03 DIAGNOSIS — G8929 Other chronic pain: Secondary | ICD-10-CM

## 2015-03-03 DIAGNOSIS — M549 Dorsalgia, unspecified: Principal | ICD-10-CM

## 2015-03-03 MED ORDER — HYDROCODONE-ACETAMINOPHEN 5-325 MG PO TABS: 1.0000 | ORAL_TABLET | Freq: Four times a day (QID) | ORAL | Status: DC | PRN

## 2015-03-03 NOTE — Telephone Encounter (Signed)
Last OV 12/10/14 Hydrocodone last filled 12/10/14 #60 with 0  CSC signed, NEEDS UDS

## 2015-03-03 NOTE — Telephone Encounter (Signed)
Caller name: Ardice Boyan  Relationship to patient: Self   Can be reached: [phone] Pharmacy:  Reason for call: pt is requesting a refill on her HYDROcodone.

## 2015-03-03 NOTE — Telephone Encounter (Signed)
Med filled and pt notified available at front desk for pick up.  

## 2015-03-03 NOTE — Telephone Encounter (Signed)
Ok to refill- needs UDS

## 2015-03-08 ENCOUNTER — Ambulatory Visit (HOSPITAL_BASED_OUTPATIENT_CLINIC_OR_DEPARTMENT_OTHER)
Admission: RE | Admit: 2015-03-08 | Discharge: 2015-03-08 | Disposition: A | Discharge status: Home / Self Care | Payer: Commercial Managed Care - HMO | Source: Ambulatory Visit | Attending: Family Medicine | Admitting: Family Medicine

## 2015-03-08 ENCOUNTER — Ambulatory Visit (INDEPENDENT_AMBULATORY_CARE_PROVIDER_SITE_OTHER): Payer: Commercial Managed Care - HMO | Admitting: Family Medicine

## 2015-03-08 ENCOUNTER — Encounter: Payer: Self-pay | Admitting: Family Medicine

## 2015-03-08 ENCOUNTER — Other Ambulatory Visit: Payer: Self-pay | Admitting: General Practice

## 2015-03-08 VITALS — BP 124/82 | HR 80 | Temp 98.1°F | Resp 16 | Wt 222.4 lb

## 2015-03-08 DIAGNOSIS — M8580 Other specified disorders of bone density and structure, unspecified site: Secondary | ICD-10-CM | POA: Insufficient documentation

## 2015-03-08 DIAGNOSIS — G8929 Other chronic pain: Secondary | ICD-10-CM

## 2015-03-08 DIAGNOSIS — M47896 Other spondylosis, lumbar region: Secondary | ICD-10-CM | POA: Insufficient documentation

## 2015-03-08 DIAGNOSIS — Z79899 Other long term (current) drug therapy: Secondary | ICD-10-CM | POA: Diagnosis not present

## 2015-03-08 DIAGNOSIS — M545 Low back pain, unspecified: Secondary | ICD-10-CM

## 2015-03-08 DIAGNOSIS — M47816 Spondylosis without myelopathy or radiculopathy, lumbar region: Secondary | ICD-10-CM | POA: Diagnosis not present

## 2015-03-08 DIAGNOSIS — M4316 Spondylolisthesis, lumbar region: Secondary | ICD-10-CM | POA: Diagnosis not present

## 2015-03-08 DIAGNOSIS — Z79891 Long term (current) use of opiate analgesic: Secondary | ICD-10-CM | POA: Diagnosis not present

## 2015-03-08 DIAGNOSIS — R319 Hematuria, unspecified: Secondary | ICD-10-CM

## 2015-03-08 DIAGNOSIS — M549 Dorsalgia, unspecified: Secondary | ICD-10-CM | POA: Diagnosis present

## 2015-03-08 LAB — POCT URINALYSIS DIPSTICK
BILIRUBIN UA: NEGATIVE
GLUCOSE UA: NEGATIVE
Ketones, UA: NEGATIVE
Leukocytes, UA: NEGATIVE
Nitrite, UA: NEGATIVE
PROTEIN UA: NEGATIVE
Urobilinogen, UA: NEGATIVE
pH, UA: 6

## 2015-03-08 MED ORDER — PREDNISONE 10 MG PO TABS: ORAL_TABLET | ORAL | Status: DC

## 2015-03-08 NOTE — Progress Notes (Signed)
Pre visit review using our clinic review tool, if applicable. No additional management support is needed unless otherwise documented below in the visit note. 

## 2015-03-08 NOTE — Patient Instructions (Signed)
Follow up as scheduled in September (this is your physical and we don't want to miss it!) We'll check your labs at that time Go downstairs and get your xrays We'll call you with your orthopedic appt Start the prednisone as directed Use the hydrocodone as needed for severe pain Call with any questions or concerns Hang in there!

## 2015-03-08 NOTE — Progress Notes (Signed)
   Subjective:    Patient ID: Ashley Woods, female    DOB: September 13, 1945, 69 y.o.   MRN: [id number]  HPI Back pain- chronic problem for pt.  Saw chiropractor 'who won't touch me until I have xrays'.  Pt reports legs are intermittently 'going numb and tingly'.  Pain will worsen as the day progresses.  Pt saw Neurosurg 'years ago' (Dr Leodis Binet) and was told there was 'nothing to do until i crawl in there'.  Pain is worsening over the 'past couple of months'.  Denies bowel or bladder incontinence.  Has pain meds available.    Review of Systems For ROS see HPI     Objective:   Physical Exam  Constitutional: She is oriented to person, place, and time. She appears well-developed and well-nourished. No distress.  HENT:  Head: Normocephalic and atraumatic.  Cardiovascular: Intact distal pulses.   Musculoskeletal: She exhibits tenderness (TTP over lumbar spine and bilateral lumbar paraspinal muscles).  Neurological: She is alert and oriented to person, place, and time. She has normal reflexes. No cranial nerve deficit. Coordination normal.  + SLR on R  Skin: Skin is warm and dry.  Psychiatric: She has a normal mood and affect. Her behavior is normal. Thought content normal.  Vitals reviewed.         Assessment & Plan:

## 2015-03-09 LAB — URINE CULTURE: Colony Count: 5000

## 2015-03-09 NOTE — Assessment & Plan Note (Signed)
Deteriorated.  Agree that chiropractor should wait on medical workup prior to adjustments.  Will get xray.  Start prednisone due to the intermittent numbness/weakness.  Refer to back specialist for possible injxn.  Reviewed supportive care and red flags that should prompt return.  Pt expressed understanding and is in agreement w/ plan.

## 2015-03-10 ENCOUNTER — Telehealth: Payer: Self-pay | Admitting: Family Medicine

## 2015-03-10 NOTE — Telephone Encounter (Signed)
Pt notified of lab results

## 2015-03-10 NOTE — Telephone Encounter (Signed)
Relation to pt: self  Call back number: [phone]  Pharmacy:  Reason for call:  Patient returning your call regarding urine results

## 2015-03-16 ENCOUNTER — Other Ambulatory Visit: Payer: Self-pay | Admitting: Family Medicine

## 2015-03-16 NOTE — Telephone Encounter (Signed)
Med denied, cannot fill prednisone without an appt

## 2015-04-01 DIAGNOSIS — M5416 Radiculopathy, lumbar region: Secondary | ICD-10-CM | POA: Diagnosis not present

## 2015-04-01 DIAGNOSIS — M549 Dorsalgia, unspecified: Secondary | ICD-10-CM | POA: Diagnosis not present

## 2015-04-02 ENCOUNTER — Other Ambulatory Visit: Payer: Self-pay | Admitting: Family Medicine

## 2015-04-02 MED ORDER — CITALOPRAM HYDROBROMIDE 40 MG PO TABS: 40.0000 mg | ORAL_TABLET | Freq: Every day | ORAL | Status: DC

## 2015-04-02 NOTE — Telephone Encounter (Signed)
Verbal approval from Dr. Tabori. 

## 2015-04-02 NOTE — Telephone Encounter (Signed)
Relation to CH:EKBT Call back [place]: [facility] [address], [place] [phone] (Phone) [phone] (Fax         Reason for call:  Patient requesting a refill clonazePAM (KLONOPIN) 0.5 MG tablet

## 2015-04-08 ENCOUNTER — Other Ambulatory Visit: Payer: Self-pay | Admitting: Family Medicine

## 2015-04-08 DIAGNOSIS — H43813 Vitreous degeneration, bilateral: Secondary | ICD-10-CM | POA: Diagnosis not present

## 2015-04-08 DIAGNOSIS — H524 Presbyopia: Secondary | ICD-10-CM | POA: Diagnosis not present

## 2015-04-08 DIAGNOSIS — H5210 Myopia, unspecified eye: Secondary | ICD-10-CM | POA: Diagnosis not present

## 2015-04-09 NOTE — Telephone Encounter (Signed)
Medication filled to pharmacy as requested.   

## 2015-04-09 NOTE — Telephone Encounter (Signed)
Last OV 12/10/14 Clonazepam last filled 11/30/14 #45 with 0

## 2015-04-22 DIAGNOSIS — H2512 Age-related nuclear cataract, left eye: Secondary | ICD-10-CM | POA: Diagnosis not present

## 2015-04-22 DIAGNOSIS — H16223 Keratoconjunctivitis sicca, not specified as Sjogren's, bilateral: Secondary | ICD-10-CM | POA: Diagnosis not present

## 2015-04-22 DIAGNOSIS — H2511 Age-related nuclear cataract, right eye: Secondary | ICD-10-CM | POA: Diagnosis not present

## 2015-04-28 ENCOUNTER — Ambulatory Visit (INDEPENDENT_AMBULATORY_CARE_PROVIDER_SITE_OTHER): Payer: Commercial Managed Care - HMO | Admitting: Family Medicine

## 2015-04-28 ENCOUNTER — Encounter: Payer: Self-pay | Admitting: Family Medicine

## 2015-04-28 VITALS — BP 126/80 | HR 70 | Temp 98.0°F | Resp 16 | Ht 64.0 in | Wt 225.0 lb

## 2015-04-28 DIAGNOSIS — Z0000 Encounter for general adult medical examination without abnormal findings: Secondary | ICD-10-CM

## 2015-04-28 DIAGNOSIS — Z23 Encounter for immunization: Secondary | ICD-10-CM

## 2015-04-28 DIAGNOSIS — E038 Other specified hypothyroidism: Secondary | ICD-10-CM

## 2015-04-28 DIAGNOSIS — F32A Depression, unspecified: Secondary | ICD-10-CM

## 2015-04-28 DIAGNOSIS — E785 Hyperlipidemia, unspecified: Secondary | ICD-10-CM | POA: Diagnosis not present

## 2015-04-28 DIAGNOSIS — F329 Major depressive disorder, single episode, unspecified: Secondary | ICD-10-CM

## 2015-04-28 DIAGNOSIS — G4733 Obstructive sleep apnea (adult) (pediatric): Secondary | ICD-10-CM

## 2015-04-28 DIAGNOSIS — E559 Vitamin D deficiency, unspecified: Secondary | ICD-10-CM | POA: Diagnosis not present

## 2015-04-28 DIAGNOSIS — I10 Essential (primary) hypertension: Secondary | ICD-10-CM | POA: Diagnosis not present

## 2015-04-28 DIAGNOSIS — E669 Obesity, unspecified: Secondary | ICD-10-CM

## 2015-04-28 LAB — CBC WITH DIFFERENTIAL/PLATELET
Basophils Absolute: 0 10*3/uL (ref 0.0–0.1)
Basophils Relative: 0.7 % (ref 0.0–3.0)
EOS PCT: 3.3 % (ref 0.0–5.0)
Eosinophils Absolute: 0.2 10*3/uL (ref 0.0–0.7)
HCT: 42.6 % (ref 36.0–46.0)
Hemoglobin: 14.3 g/dL (ref 12.0–15.0)
Lymphocytes Relative: 25.2 % (ref 12.0–46.0)
Lymphs Abs: 1.3 10*3/uL (ref 0.7–4.0)
MCHC: 33.6 g/dL (ref 30.0–36.0)
MCV: 93.8 fl (ref 78.0–100.0)
MONO ABS: 0.5 10*3/uL (ref 0.1–1.0)
MONOS PCT: 9.6 % (ref 3.0–12.0)
Neutro Abs: 3.1 10*3/uL (ref 1.4–7.7)
Neutrophils Relative %: 61.2 % (ref 43.0–77.0)
Platelets: 166 10*3/uL (ref 150.0–400.0)
RBC: 4.54 Mil/uL (ref 3.87–5.11)
RDW: 13.6 % (ref 11.5–15.5)
WBC: 5.1 10*3/uL (ref 4.0–10.5)

## 2015-04-28 LAB — HEPATIC FUNCTION PANEL
ALBUMIN: 4.2 g/dL (ref 3.5–5.2)
ALT: 19 U/L (ref 0–35)
AST: 17 U/L (ref 0–37)
Alkaline Phosphatase: 59 U/L (ref 39–117)
Bilirubin, Direct: 0.1 mg/dL (ref 0.0–0.3)
Total Bilirubin: 0.4 mg/dL (ref 0.2–1.2)
Total Protein: 6.4 g/dL (ref 6.0–8.3)

## 2015-04-28 LAB — LIPID PANEL
CHOLESTEROL: 199 mg/dL (ref 0–200)
HDL: 56.9 mg/dL (ref >39.00)
NonHDL: 141.98
Total CHOL/HDL Ratio: 3
Triglycerides: 387 mg/dL — ABNORMAL HIGH (ref 0.0–149.0)
VLDL: 77.4 mg/dL — ABNORMAL HIGH (ref 0.0–40.0)

## 2015-04-28 LAB — BASIC METABOLIC PANEL
BUN: 18 mg/dL (ref 6–23)
CO2: 31 mEq/L (ref 19–32)
Calcium: 9.6 mg/dL (ref 8.4–10.5)
Chloride: 106 mEq/L (ref 96–112)
Creatinine, Ser: 0.71 mg/dL (ref 0.40–1.20)
GFR: 86.75 mL/min (ref >60.00)
GLUCOSE: 92 mg/dL (ref 70–99)
POTASSIUM: 4.7 meq/L (ref 3.5–5.1)
Sodium: 142 mEq/L (ref 135–145)

## 2015-04-28 LAB — LDL CHOLESTEROL, DIRECT: Direct LDL: 92 mg/dL

## 2015-04-28 LAB — TSH: TSH: 1.83 u[IU]/mL (ref 0.35–4.50)

## 2015-04-28 LAB — VITAMIN D 25 HYDROXY (VIT D DEFICIENCY, FRACTURES): VITD: 14.46 ng/mL — AB (ref 30.00–100.00)

## 2015-04-28 MED ORDER — FUROSEMIDE 20 MG PO TABS: 20.0000 mg | ORAL_TABLET | Freq: Every day | ORAL | Status: DC

## 2015-04-28 NOTE — Progress Notes (Signed)
   Subjective:    Patient ID: Ashley Woods, female    DOB: 06/09/1946, 69 y.o.   MRN: [id number]  HPI Here today for CPE.  Risk Factors: HTN- chronic problem, on Lisinopril.  Previously on Lasix for LE edema- asking for refill.  Denies CP, SOB, HAs, visual changes, edema. Hyperlipidemia- chronic problem, on Simvastatin.  Denies abd pain, N/V. Hypothyroid- chronic problem, on Levothyroxine OSA- chronic problem, previously seeing Dr Gwenette Greet.  Now seeing provider in [place].  Good compliance w/ BiPap. Vit D deficiency- due for repeat Obesity- chronic problem, pt gets limited exercise due to multiple orthopedic issues.  Doing Isogenix off and on. Physical Activity: limited due to multiple orthopedic issues. Fall Risk: low Depression: chronic problem, stable on Celexa Hearing: decreased, wears hearing aides bilaterally ADL's: independent Cognitive: normal linear thought process, memory and attention intact Home Safety: safe at home, lives w/ husband and disabled daughter Height, Weight, BMI, Visual Acuity: see vitals, vision corrected to 20/20 w/ glasses Counseling: UTD on colonoscopy (Dr Henrene Pastor, due 2018), UTD on mammo (due Dec), DEXA (2017).  Plan is for Prevnar and flu today. Care team reviewed and updated Labs Ordered: See A&P Care Plan: See A&P    Review of Systems Patient reports no vision/ hearing changes, adenopathy,fever, weight change,  persistant/recurrent hoarseness , swallowing issues, chest pain, palpitations, edema, persistant/recurrent cough, hemoptysis, dyspnea (rest/exertional/paroxysmal nocturnal), gastrointestinal bleeding (melena, rectal bleeding), abdominal pain, significant heartburn, bowel changes, GU symptoms (dysuria, hematuria, incontinence), Gyn symptoms (abnormal  bleeding, pain),  syncope, focal weakness, memory loss, skin/hair/nail changes, abnormal bruising or bleeding, anxiety, or depression.  + numbness/tingling due to chronic back issues     Objective:   Physical Exam General Appearance:    Alert, cooperative, no distress, appears stated age  Head:    Normocephalic, without obvious abnormality, atraumatic  Eyes:    PERRL, conjunctiva/corneas clear, EOM's intact, fundi    benign, both eyes  Ears:    Normal TM's and external ear canals, both ears  Nose:   Nares normal, septum midline, mucosa normal, no drainage    or sinus tenderness  Throat:   Lips, mucosa, and tongue normal; teeth and gums normal  Neck:   Supple, symmetrical, trachea midline, no adenopathy;    Thyroid: no enlargement/tenderness/nodules  Back:     Symmetric, no curvature, ROM normal, no CVA tenderness  Lungs:     Clear to auscultation bilaterally, respirations unlabored  Chest Wall:    No tenderness or deformity   Heart:    Regular rate and rhythm, S1 and S2 normal, no murmur, rub   or gallop  Breast Exam:    Deferred to mammo  Abdomen:     Soft, non-tender, bowel sounds active all four quadrants,    no masses, no organomegaly  Genitalia:    Deferred  Rectal:    Extremities:   Extremities normal, atraumatic, no cyanosis or edema  Pulses:   2+ and symmetric all extremities  Skin:   Skin color, texture, turgor normal, no rashes or lesions  Lymph nodes:   Cervical, supraclavicular, and axillary nodes normal  Neurologic:   CNII-XII intact, normal strength throughout          Assessment & Plan:

## 2015-04-28 NOTE — Progress Notes (Signed)
Pre visit review using our clinic review tool, if applicable. No additional management support is needed unless otherwise documented below in the visit note. 

## 2015-04-28 NOTE — Patient Instructions (Signed)
Follow up in 6 months to recheck blood pressure, cholesterol, and thyroid We'll notify you of your lab results and make any changes if needed You are up to date on colonoscopy- due 2018, mammo- due December, bone density- 2017 Keep up the good work on healthy diet and regular exercise- you can do it!!! If you want to join [place] at the new [place] office, any scheduled appointments will automatically transfer and we will see you at [address] [place] [address] [place], [place], [place] [address] Call with any questions or concerns Happy Belated Birthday!!!

## 2015-04-29 ENCOUNTER — Other Ambulatory Visit: Payer: Self-pay | Admitting: Family Medicine

## 2015-04-30 ENCOUNTER — Other Ambulatory Visit: Payer: Self-pay | Admitting: General Practice

## 2015-04-30 MED ORDER — VITAMIN D (ERGOCALCIFEROL) 1.25 MG (50000 UNIT) PO CAPS: 50000.0000 [IU] | ORAL_CAPSULE | ORAL | Status: DC

## 2015-04-30 NOTE — Assessment & Plan Note (Signed)
Chronic problem.  Tolerating statin w/o difficulty.  Stressed need for healthy diet and regular exercise.  Check labs.  Adjust meds prn  

## 2015-04-30 NOTE — Assessment & Plan Note (Signed)
Chronic problem.  Adequate control.  Asymptomatic.  Check labs.  No anticipated med changes.  Will continue to follow. 

## 2015-04-30 NOTE — Assessment & Plan Note (Signed)
On Bipap.  Seeing Dr in [place].  Will follow along.

## 2015-04-30 NOTE — Assessment & Plan Note (Signed)
Chronic problem, stable on Celexa.  No changes at this time.

## 2015-04-30 NOTE — Telephone Encounter (Signed)
Medication filled to pharmacy as requested.   

## 2015-04-30 NOTE — Assessment & Plan Note (Signed)
Pt's PE unchanged from previous.  UTD on colonoscopy, mammo, DEXA.  Written screening schedule updated and given to pt.  Flu and prevnar given today.  Check labs.  Anticipatory guidance provided.

## 2015-04-30 NOTE — Assessment & Plan Note (Signed)
Check labs.  Replete prn. 

## 2015-04-30 NOTE — Assessment & Plan Note (Signed)
Chronic problem.  Currently asymptomatic w/ exception of difficulty losing weight.  Check labs.  Adjust meds prn.

## 2015-04-30 NOTE — Assessment & Plan Note (Signed)
Chronic problem.  Stressed need for healthy diet and regular exercise.  Check labs to risk stratify.  Will follow. 

## 2015-05-06 ENCOUNTER — Other Ambulatory Visit: Payer: Self-pay | Admitting: Family Medicine

## 2015-05-06 NOTE — Telephone Encounter (Signed)
Medication filled to pharmacy as requested.   

## 2015-05-10 ENCOUNTER — Other Ambulatory Visit: Payer: Self-pay | Admitting: *Deleted

## 2015-05-10 MED ORDER — CITALOPRAM HYDROBROMIDE 40 MG PO TABS: 40.0000 mg | ORAL_TABLET | Freq: Every day | ORAL | Status: DC

## 2015-05-10 NOTE — Telephone Encounter (Signed)
Medication Detail      Disp Refills Start End     citalopram (CELEXA) 40 MG tablet 90 tablet 0 11/05/2014     Sig - Route: Take 1 tablet (40 mg total) by mouth daily. - Oral    E-Prescribing Status: Receipt confirmed by pharmacy (11/05/2014 4:29 PM EDT)     Pharmacy    [place], [address]   Omeprazole/Esomeprazole / Citalopram Drug-Drug Interaction Report Significance: Major  Warning: Plasma concentrations and toxic effects of citalopram may be increased by concomitant administration of omeprazole. Specifically, citalopram doses greater than 20 mg/day are not recommended in patients receiving omeprazole according to official package labeling due to the risk of QT prolongation.  Onset: Delayed Document Level: Possible  Interacting Medications/Orders:  Omeprazole/Esomeprazole Oral, Systemic Citalopram Oral, Systemic  1. omeprazole Order ([id number]): omeprazole (PRILOSEC) 40 MG capsule Route: Oral Start: none End: none Frequency: Daily 1. citalopram Order: citalopram (CELEXA) 40 MG tablet Route: Oral Start: 05/10/2015 End: none Frequency: Daily   Management Code: Professional review suggested Effects: Plasma concentrations and toxic effects of citalopram may be increased by concomitant administration of omeprazole. Mechanism: Inhibition of CYP2C19 by omeprazole may decrease metabolic elimination of citalopram. Management: Dosage reduction of citalopram may be needed during concurrent administration of omeprazole. Specifically, citalopram doses greater than 20 mg/day are not recommended in patients receiving omeprazole due to the risk of QT prolongation according to official package labeling.   Please Advise on refills/SLS

## 2015-05-11 DIAGNOSIS — H2511 Age-related nuclear cataract, right eye: Secondary | ICD-10-CM | POA: Diagnosis not present

## 2015-05-25 DIAGNOSIS — J45909 Unspecified asthma, uncomplicated: Secondary | ICD-10-CM | POA: Diagnosis not present

## 2015-05-25 DIAGNOSIS — I10 Essential (primary) hypertension: Secondary | ICD-10-CM | POA: Diagnosis not present

## 2015-05-25 DIAGNOSIS — Z79899 Other long term (current) drug therapy: Secondary | ICD-10-CM | POA: Diagnosis not present

## 2015-05-25 DIAGNOSIS — K219 Gastro-esophageal reflux disease without esophagitis: Secondary | ICD-10-CM | POA: Diagnosis not present

## 2015-05-25 DIAGNOSIS — H2512 Age-related nuclear cataract, left eye: Secondary | ICD-10-CM | POA: Diagnosis not present

## 2015-05-25 DIAGNOSIS — H2511 Age-related nuclear cataract, right eye: Secondary | ICD-10-CM | POA: Diagnosis not present

## 2015-05-25 DIAGNOSIS — E669 Obesity, unspecified: Secondary | ICD-10-CM | POA: Diagnosis not present

## 2015-05-25 DIAGNOSIS — G4730 Sleep apnea, unspecified: Secondary | ICD-10-CM | POA: Diagnosis not present

## 2015-05-25 DIAGNOSIS — E039 Hypothyroidism, unspecified: Secondary | ICD-10-CM | POA: Diagnosis not present

## 2015-05-25 DIAGNOSIS — E785 Hyperlipidemia, unspecified: Secondary | ICD-10-CM | POA: Diagnosis not present

## 2015-06-03 ENCOUNTER — Other Ambulatory Visit: Payer: Self-pay | Admitting: Family Medicine

## 2015-06-03 NOTE — Telephone Encounter (Signed)
Medication filled to pharmacy as requested.   

## 2015-06-28 ENCOUNTER — Encounter: Payer: Self-pay | Admitting: Family Medicine

## 2015-06-28 ENCOUNTER — Ambulatory Visit (INDEPENDENT_AMBULATORY_CARE_PROVIDER_SITE_OTHER): Payer: Commercial Managed Care - HMO | Admitting: Family Medicine

## 2015-06-28 VITALS — BP 130/72 | HR 95 | Temp 99.3°F | Resp 16 | Ht 64.0 in | Wt 229.0 lb

## 2015-06-28 DIAGNOSIS — R600 Localized edema: Secondary | ICD-10-CM | POA: Diagnosis not present

## 2015-06-28 DIAGNOSIS — R609 Edema, unspecified: Secondary | ICD-10-CM | POA: Insufficient documentation

## 2015-06-28 DIAGNOSIS — R5383 Other fatigue: Secondary | ICD-10-CM

## 2015-06-28 DIAGNOSIS — R51 Headache: Secondary | ICD-10-CM | POA: Diagnosis not present

## 2015-06-28 DIAGNOSIS — E538 Deficiency of other specified B group vitamins: Secondary | ICD-10-CM

## 2015-06-28 DIAGNOSIS — R519 Headache, unspecified: Secondary | ICD-10-CM | POA: Insufficient documentation

## 2015-06-28 DIAGNOSIS — IMO0002 Reserved for concepts with insufficient information to code with codable children: Secondary | ICD-10-CM | POA: Insufficient documentation

## 2015-06-28 MED ORDER — TIZANIDINE HCL 4 MG PO TABS: 4.0000 mg | ORAL_TABLET | Freq: Every day | ORAL | Status: DC

## 2015-06-28 NOTE — Assessment & Plan Note (Signed)
Recurrent problem for pt.  No improvement w/ lasix.  Suspect this is due to dietary indiscretions with the recent holiday.  No evidence of edema today.  Encouraged increased fluid intake, limited Na, and elevation.  Will follow.

## 2015-06-28 NOTE — Patient Instructions (Signed)
Follow up as needed We'll notify you of your lab results and make any changes if needed Make sure you drinking plenty of fluids, elevating your legs, and limiting your salt intake Take the Tizanidine before bed to help w/ neck spasm HEAT!! Call with any questions or concerns Hang in there! Happy Holidays!!!

## 2015-06-28 NOTE — Progress Notes (Signed)
   Subjective:    Patient ID: Ashley Woods, female    DOB: 02-20-1946, 69 y.o.   MRN: [id number]  HPI Edema- 'the fluid pills aren't doing anything' x2 weeks.  No swelling today.  Limited water intake.  High Na diet recently w/ the holiday.  Denies CP, had SOB on Saturday but this has resolved.  No edema of hands.  'extremely tired'- pt reports she has slept for 2 days.  + SOB on Saturday- but this has resolved.  + HA occipital- 'really bad', started 4-5 days ago, no relief w/ tylenol.  'i haven't eaten since Thursday'- 'nothing sounds good, nothing smells good'.  Denies increased stress.  No CP.  Pt had cataract surgery 10/29 and L eye is to be done tomorrow.   Review of Systems For ROS see HPI     Objective:   Physical Exam  Constitutional: She is oriented to person, place, and time. She appears well-developed and well-nourished. No distress.  Appears tired, pale  HENT:  Head: Normocephalic and atraumatic.  Eyes: Conjunctivae and EOM are normal. Pupils are equal, round, and reactive to light.  Neck: Normal range of motion. Neck supple. No thyromegaly present.  + trap spasm bilaterally  Cardiovascular: Normal rate, regular rhythm, normal heart sounds and intact distal pulses.   Pulmonary/Chest: Effort normal and breath sounds normal. No respiratory distress. She has no wheezes. She has no rales.  Abdominal: Soft. Bowel sounds are normal. She exhibits no distension. There is no tenderness. There is no rebound and no guarding.  Musculoskeletal: She exhibits no edema or tenderness.  Lymphadenopathy:    She has no cervical adenopathy.  Neurological: She is alert and oriented to person, place, and time.  Skin: Skin is warm and dry. There is pallor.  Psychiatric: She has a normal mood and affect. Her behavior is normal. Thought content normal.  Vitals reviewed.         Assessment & Plan:

## 2015-06-28 NOTE — Assessment & Plan Note (Signed)
Recurrent problem for pt.  Check labs to r/o infxn, electrolyte abnormality, CHF, thyroid issue.  Pt seems to have a viral process going on- fatigue, anorexia, HA.  Encouraged increased fluids, rest.  If no improvement, pt to notify me.  Will follow.

## 2015-06-28 NOTE — Assessment & Plan Note (Signed)
Pt w/ hx of similar.  May be contributing to her fatigue.  Check level.  Replete prn.

## 2015-06-28 NOTE — Assessment & Plan Note (Signed)
New.  Pt's current occipital HA sounds multifactorial- dehydration, stress, visual discrepancy since unilateral cataract surgery.  No focal neuro signs on PE.  + trap spasm.  Start muscle relaxer.  Continue tylenol prn.  Reviewed supportive care and red flags that should prompt return.  Pt expressed understanding and is in agreement w/ plan.

## 2015-06-29 DIAGNOSIS — E785 Hyperlipidemia, unspecified: Secondary | ICD-10-CM | POA: Diagnosis not present

## 2015-06-29 DIAGNOSIS — Z8673 Personal history of transient ischemic attack (TIA), and cerebral infarction without residual deficits: Secondary | ICD-10-CM | POA: Diagnosis not present

## 2015-06-29 DIAGNOSIS — J45909 Unspecified asthma, uncomplicated: Secondary | ICD-10-CM | POA: Diagnosis not present

## 2015-06-29 DIAGNOSIS — G43909 Migraine, unspecified, not intractable, without status migrainosus: Secondary | ICD-10-CM | POA: Diagnosis not present

## 2015-06-29 DIAGNOSIS — K219 Gastro-esophageal reflux disease without esophagitis: Secondary | ICD-10-CM | POA: Diagnosis not present

## 2015-06-29 DIAGNOSIS — E039 Hypothyroidism, unspecified: Secondary | ICD-10-CM | POA: Diagnosis not present

## 2015-06-29 DIAGNOSIS — G4733 Obstructive sleep apnea (adult) (pediatric): Secondary | ICD-10-CM | POA: Diagnosis not present

## 2015-06-29 DIAGNOSIS — I10 Essential (primary) hypertension: Secondary | ICD-10-CM | POA: Diagnosis not present

## 2015-06-29 DIAGNOSIS — H2512 Age-related nuclear cataract, left eye: Secondary | ICD-10-CM | POA: Diagnosis not present

## 2015-06-29 LAB — HEPATIC FUNCTION PANEL
ALT: 17 U/L (ref 0–35)
AST: 23 U/L (ref 0–37)
Albumin: 3.9 g/dL (ref 3.5–5.2)
Alkaline Phosphatase: 62 U/L (ref 39–117)
BILIRUBIN TOTAL: 0.9 mg/dL (ref 0.2–1.2)
Bilirubin, Direct: 0.2 mg/dL (ref 0.0–0.3)
TOTAL PROTEIN: 6.6 g/dL (ref 6.0–8.3)

## 2015-06-29 LAB — CBC WITH DIFFERENTIAL/PLATELET
BASOS ABS: 0 10*3/uL (ref 0.0–0.1)
BASOS PCT: 0.3 % (ref 0.0–3.0)
EOS ABS: 0 10*3/uL (ref 0.0–0.7)
Eosinophils Relative: 0.2 % (ref 0.0–5.0)
HCT: 41.6 % (ref 36.0–46.0)
HEMOGLOBIN: 13.9 g/dL (ref 12.0–15.0)
LYMPHS PCT: 9.2 % — AB (ref 12.0–46.0)
Lymphs Abs: 1 10*3/uL (ref 0.7–4.0)
MCHC: 33.5 g/dL (ref 30.0–36.0)
MCV: 95.4 fl (ref 78.0–100.0)
MONO ABS: 0.6 10*3/uL (ref 0.1–1.0)
Monocytes Relative: 5.7 % (ref 3.0–12.0)
Neutro Abs: 8.8 10*3/uL — ABNORMAL HIGH (ref 1.4–7.7)
Neutrophils Relative %: 84.6 % — ABNORMAL HIGH (ref 43.0–77.0)
PLATELETS: 154 10*3/uL (ref 150.0–400.0)
RBC: 4.36 Mil/uL (ref 3.87–5.11)
RDW: 13.2 % (ref 11.5–15.5)
WBC: 10.4 10*3/uL (ref 4.0–10.5)

## 2015-06-29 LAB — BASIC METABOLIC PANEL
BUN: 13 mg/dL (ref 6–23)
CALCIUM: 9.5 mg/dL (ref 8.4–10.5)
CHLORIDE: 103 meq/L (ref 96–112)
CO2: 27 meq/L (ref 19–32)
CREATININE: 0.82 mg/dL (ref 0.40–1.20)
GFR: 73.42 mL/min (ref >60.00)
GLUCOSE: 114 mg/dL — AB (ref 70–99)
Potassium: 4.3 mEq/L (ref 3.5–5.1)
Sodium: 139 mEq/L (ref 135–145)

## 2015-06-29 LAB — BRAIN NATRIURETIC PEPTIDE: PRO B NATRI PEPTIDE: 93 pg/mL (ref 0.0–100.0)

## 2015-06-29 LAB — B12 AND FOLATE PANEL
FOLATE: 20.8 ng/mL (ref >5.9)
VITAMIN B 12: 208 pg/mL — AB (ref 211–911)

## 2015-06-29 LAB — TSH: TSH: 3.08 u[IU]/mL (ref 0.35–4.50)

## 2015-07-08 ENCOUNTER — Ambulatory Visit (INDEPENDENT_AMBULATORY_CARE_PROVIDER_SITE_OTHER): Payer: Commercial Managed Care - HMO

## 2015-07-08 DIAGNOSIS — E538 Deficiency of other specified B group vitamins: Secondary | ICD-10-CM

## 2015-07-08 MED ORDER — CYANOCOBALAMIN 1000 MCG/ML IJ SOLN
1000.0000 ug | Freq: Once | INTRAMUSCULAR | Status: AC
  Administered 2015-07-08: 1000 ug via INTRAMUSCULAR

## 2015-07-08 NOTE — Progress Notes (Signed)
Pre visit review using our clinic review tool, if applicable. No additional management support is needed unless otherwise documented below in the visit note.   Patient in for B12 injection. Injection given in R deltoid. Patient tolerated well.

## 2015-07-15 ENCOUNTER — Ambulatory Visit: Payer: Commercial Managed Care - HMO

## 2015-07-16 ENCOUNTER — Ambulatory Visit (INDEPENDENT_AMBULATORY_CARE_PROVIDER_SITE_OTHER): Payer: Commercial Managed Care - HMO

## 2015-07-16 DIAGNOSIS — E538 Deficiency of other specified B group vitamins: Secondary | ICD-10-CM

## 2015-07-16 MED ORDER — CYANOCOBALAMIN 1000 MCG/ML IJ SOLN
1000.0000 ug | Freq: Once | INTRAMUSCULAR | Status: AC
  Administered 2015-07-16: 1000 ug via INTRAMUSCULAR

## 2015-07-16 NOTE — Progress Notes (Signed)
Pre visit review using our clinic review tool, if applicable. No additional management support is needed unless otherwise documented below in the visit note.  Patient in for B 12 injection. Given Right deltoid. No complaints voiced.

## 2015-07-22 ENCOUNTER — Ambulatory Visit (INDEPENDENT_AMBULATORY_CARE_PROVIDER_SITE_OTHER): Payer: Commercial Managed Care - HMO

## 2015-07-22 DIAGNOSIS — E538 Deficiency of other specified B group vitamins: Secondary | ICD-10-CM | POA: Diagnosis not present

## 2015-07-22 MED ORDER — CYANOCOBALAMIN 1000 MCG/ML IJ SOLN
1000.0000 ug | Freq: Once | INTRAMUSCULAR | Status: AC
  Administered 2015-07-22: 1000 ug via INTRAMUSCULAR

## 2015-07-22 NOTE — Progress Notes (Signed)
Pre visit review using our clinic review tool, if applicable. No additional management support is needed unless otherwise documented below in the visit note.   Patient in for B12 Injection. Given IM Right deltoid. Patient tolerated well. Return appointment scheduled.

## 2015-07-29 ENCOUNTER — Ambulatory Visit (INDEPENDENT_AMBULATORY_CARE_PROVIDER_SITE_OTHER): Payer: Commercial Managed Care - HMO

## 2015-07-29 DIAGNOSIS — E538 Deficiency of other specified B group vitamins: Secondary | ICD-10-CM

## 2015-07-29 MED ORDER — CYANOCOBALAMIN 1000 MCG/ML IJ SOLN
1000.0000 ug | Freq: Once | INTRAMUSCULAR | Status: AC
  Administered 2015-08-10: 1000 ug via INTRAMUSCULAR

## 2015-07-29 NOTE — Progress Notes (Signed)
Pre visit review using our clinic review tool, if applicable. No additional management support is needed unless otherwise documented below in the visit note.  Patient in for B 12 injection. Given IM Right deltoid. Patient tolerated well. Return appointment scheduled for patient.

## 2015-08-10 ENCOUNTER — Other Ambulatory Visit: Payer: Self-pay | Admitting: Family Medicine

## 2015-08-13 DIAGNOSIS — Z0100 Encounter for examination of eyes and vision without abnormal findings: Secondary | ICD-10-CM | POA: Diagnosis not present

## 2015-08-26 ENCOUNTER — Ambulatory Visit (INDEPENDENT_AMBULATORY_CARE_PROVIDER_SITE_OTHER): Payer: Commercial Managed Care - HMO | Admitting: *Deleted

## 2015-08-26 DIAGNOSIS — E538 Deficiency of other specified B group vitamins: Secondary | ICD-10-CM | POA: Diagnosis not present

## 2015-08-26 MED ORDER — CYANOCOBALAMIN 1000 MCG/ML IJ SOLN
1000.0000 ug | Freq: Once | INTRAMUSCULAR | Status: AC
  Administered 2015-08-26: 1000 ug via INTRAMUSCULAR

## 2015-08-26 NOTE — Progress Notes (Signed)
Pre visit review using our clinic review tool, if applicable. No additional management support is needed unless otherwise documented below in the visit note.  Pt tolerated injection well.   Odaliz Mcqueary J Cherrill Scrima, RN  

## 2015-09-13 ENCOUNTER — Other Ambulatory Visit: Payer: Self-pay | Admitting: Family Medicine

## 2015-09-13 DIAGNOSIS — Z1231 Encounter for screening mammogram for malignant neoplasm of breast: Secondary | ICD-10-CM

## 2015-09-15 MED ORDER — CYANOCOBALAMIN 1000 MCG/ML IJ SOLN
1000.0000 ug | Freq: Once | INTRAMUSCULAR | Status: AC
  Administered 2015-09-16: 1000 ug via INTRAMUSCULAR

## 2015-09-15 NOTE — Addendum Note (Signed)
Addended by: Bunnie Domino on: 09/15/2015 03:10 PM   Modules accepted: Orders

## 2015-09-16 DIAGNOSIS — E538 Deficiency of other specified B group vitamins: Secondary | ICD-10-CM

## 2015-09-16 DIAGNOSIS — G4733 Obstructive sleep apnea (adult) (pediatric): Secondary | ICD-10-CM | POA: Diagnosis not present

## 2015-09-17 ENCOUNTER — Encounter: Payer: Self-pay | Admitting: Family Medicine

## 2015-09-17 ENCOUNTER — Ambulatory Visit (INDEPENDENT_AMBULATORY_CARE_PROVIDER_SITE_OTHER): Payer: Commercial Managed Care - HMO | Admitting: Family Medicine

## 2015-09-17 VITALS — BP 140/70 | HR 89 | Temp 99.2°F | Ht 64.0 in | Wt 229.6 lb

## 2015-09-17 DIAGNOSIS — M549 Dorsalgia, unspecified: Secondary | ICD-10-CM

## 2015-09-17 DIAGNOSIS — R609 Edema, unspecified: Secondary | ICD-10-CM | POA: Diagnosis not present

## 2015-09-17 DIAGNOSIS — G8929 Other chronic pain: Secondary | ICD-10-CM | POA: Diagnosis not present

## 2015-09-17 DIAGNOSIS — R600 Localized edema: Secondary | ICD-10-CM | POA: Diagnosis not present

## 2015-09-17 LAB — CBC WITH DIFFERENTIAL/PLATELET
BASOS PCT: 1 % (ref 0–1)
Basophils Absolute: 0 10*3/uL (ref 0.0–0.1)
Eosinophils Absolute: 0.1 10*3/uL (ref 0.0–0.7)
Eosinophils Relative: 2 % (ref 0–5)
HCT: 43.7 % (ref 36.0–46.0)
HEMOGLOBIN: 14.7 g/dL (ref 12.0–15.0)
Lymphocytes Relative: 34 % (ref 12–46)
Lymphs Abs: 1.6 10*3/uL (ref 0.7–4.0)
MCH: 32.1 pg (ref 26.0–34.0)
MCHC: 33.6 g/dL (ref 30.0–36.0)
MCV: 95.4 fL (ref 78.0–100.0)
MPV: 11.2 fL (ref 8.6–12.4)
Monocytes Absolute: 0.4 10*3/uL (ref 0.1–1.0)
Monocytes Relative: 8 % (ref 3–12)
NEUTROS ABS: 2.6 10*3/uL (ref 1.7–7.7)
NEUTROS PCT: 55 % (ref 43–77)
Platelets: 186 10*3/uL (ref 150–400)
RBC: 4.58 MIL/uL (ref 3.87–5.11)
RDW: 13.7 % (ref 11.5–15.5)
WBC: 4.7 10*3/uL (ref 4.0–10.5)

## 2015-09-17 LAB — HEPATIC FUNCTION PANEL
ALBUMIN: 4.2 g/dL (ref 3.6–5.1)
ALK PHOS: 58 U/L (ref 33–130)
ALT: 19 U/L (ref 6–29)
AST: 19 U/L (ref 10–35)
BILIRUBIN DIRECT: 0.1 mg/dL (ref <=0.2)
BILIRUBIN TOTAL: 0.6 mg/dL (ref 0.2–1.2)
Indirect Bilirubin: 0.5 mg/dL (ref 0.2–1.2)
Total Protein: 6.4 g/dL (ref 6.1–8.1)

## 2015-09-17 LAB — BASIC METABOLIC PANEL
BUN: 17 mg/dL (ref 7–25)
CO2: 27 mmol/L (ref 20–31)
CREATININE: 0.78 mg/dL (ref 0.50–0.99)
Calcium: 9.3 mg/dL (ref 8.6–10.4)
Chloride: 106 mmol/L (ref 98–110)
Glucose, Bld: 94 mg/dL (ref 65–99)
POTASSIUM: 4.6 mmol/L (ref 3.5–5.3)
Sodium: 142 mmol/L (ref 135–146)

## 2015-09-17 MED ORDER — HYDROCODONE-ACETAMINOPHEN 5-325 MG PO TABS: 1.0000 | ORAL_TABLET | Freq: Four times a day (QID) | ORAL | Status: DC | PRN

## 2015-09-17 MED ORDER — FUROSEMIDE 20 MG PO TABS: 40.0000 mg | ORAL_TABLET | Freq: Every day | ORAL | Status: DC

## 2015-09-17 NOTE — Patient Instructions (Signed)
Follow up next week if no improvement We'll notify you of your lab results and make any changes if needed Increase the Lasix to 2 tabs daily Drink plenty of water Limit your salt intake Elevate your legs when you are able Call with any questions or concerns Have a great weekend!!!

## 2015-09-17 NOTE — Progress Notes (Signed)
   Subjective:    Patient ID: Ashley Woods, female    DOB: [birth year], 70 y.o.   MRN: [id number]  HPI Edema of feet- sxs started ~1 week ago.  Some tingling of feet w/ purplish discoloration.  Mild SOB.  No chest pain.  No improvement w/ Lasix.  No dietary changes.  No swelling of hands.  L>R.  Tenderness due to swelling.   Review of Systems For ROS see HPI     Objective:   Physical Exam  Constitutional: She is oriented to person, place, and time. She appears well-developed and well-nourished. No distress.  HENT:  Head: Normocephalic and atraumatic.  Cardiovascular: Normal rate, regular rhythm, normal heart sounds and intact distal pulses.   Pulmonary/Chest: Effort normal and breath sounds normal. No respiratory distress. She has no wheezes. She has no rales.  Musculoskeletal: She exhibits edema (2+ bilateral LE edema to mid shin) and tenderness (TTP over B LEs).  Neurological: She is alert and oriented to person, place, and time.  Skin: Skin is warm and dry. No erythema.  Psychiatric: She has a normal mood and affect. Her behavior is normal. Thought content normal.  Vitals reviewed.         Assessment & Plan:

## 2015-09-17 NOTE — Assessment & Plan Note (Signed)
Recurrent problem for pt.  No evidence of pulmonary edema on PE.  Denies dietary changes recently.  Will check labs to r/o underlying metabolic cause.  Increase Lasix to 40mg  daily.  Reviewed supportive care and red flags that should prompt return.  Pt expressed understanding and is in agreement w/ plan.

## 2015-09-17 NOTE — Progress Notes (Signed)
Pre visit review using our clinic review tool, if applicable. No additional management support is needed unless otherwise documented below in the visit note. 

## 2015-09-18 LAB — TSH: TSH: 2.77 m[IU]/L

## 2015-09-20 ENCOUNTER — Encounter: Payer: Self-pay | Admitting: General Practice

## 2015-09-22 ENCOUNTER — Ambulatory Visit (INDEPENDENT_AMBULATORY_CARE_PROVIDER_SITE_OTHER): Payer: Commercial Managed Care - HMO

## 2015-09-22 DIAGNOSIS — Z1231 Encounter for screening mammogram for malignant neoplasm of breast: Secondary | ICD-10-CM

## 2015-09-28 ENCOUNTER — Ambulatory Visit (INDEPENDENT_AMBULATORY_CARE_PROVIDER_SITE_OTHER): Payer: Commercial Managed Care - HMO | Admitting: Behavioral Health

## 2015-09-28 DIAGNOSIS — E538 Deficiency of other specified B group vitamins: Secondary | ICD-10-CM | POA: Diagnosis not present

## 2015-09-28 MED ORDER — CYANOCOBALAMIN 1000 MCG/ML IJ SOLN
1000.0000 ug | Freq: Once | INTRAMUSCULAR | Status: AC
  Administered 2015-09-28: 1000 ug via INTRAMUSCULAR

## 2015-09-28 NOTE — Progress Notes (Signed)
Pre visit review using our clinic review tool, if applicable. No additional management support is needed unless otherwise documented below in the visit note.  Patient in office for B12 injection. IM given in Left Deltoid. Patient tolerated injection well. Next appointment 10/26/15 at 2:00 PM.

## 2015-10-04 ENCOUNTER — Telehealth: Payer: Self-pay | Admitting: Family Medicine

## 2015-10-04 DIAGNOSIS — G8929 Other chronic pain: Secondary | ICD-10-CM

## 2015-10-04 DIAGNOSIS — M549 Dorsalgia, unspecified: Principal | ICD-10-CM

## 2015-10-04 NOTE — Telephone Encounter (Signed)
[facility] for [facility]o Referral in [place]- they will order MRI if needed (chronic back pain)

## 2015-10-04 NOTE — Telephone Encounter (Signed)
Referral placed per PCP 

## 2015-10-04 NOTE — Telephone Encounter (Signed)
Relation to [id number] Call back [place]:  Reason for call:  Patient checking on the status of Ortho located in [facility] referral and MRI order.

## 2015-10-04 NOTE — Telephone Encounter (Signed)
Pt would like to go to [facility], [address], [place], [place]. Would not let me add it to her referral note.

## 2015-10-04 NOTE — Telephone Encounter (Signed)
Please advise, no documentation of this at last ov on 09/17/15.

## 2015-10-05 NOTE — Telephone Encounter (Signed)
Dr Lowella Dell 3/23 @8 :40, pt aware

## 2015-10-14 ENCOUNTER — Other Ambulatory Visit: Payer: Self-pay | Admitting: Family Medicine

## 2015-10-14 NOTE — Telephone Encounter (Signed)
Medication filled to pharmacy as requested.   

## 2015-10-21 DIAGNOSIS — M5416 Radiculopathy, lumbar region: Secondary | ICD-10-CM | POA: Diagnosis not present

## 2015-10-21 DIAGNOSIS — M545 Low back pain: Secondary | ICD-10-CM | POA: Diagnosis not present

## 2015-10-26 ENCOUNTER — Ambulatory Visit (INDEPENDENT_AMBULATORY_CARE_PROVIDER_SITE_OTHER): Payer: Commercial Managed Care - HMO | Admitting: Behavioral Health

## 2015-10-26 DIAGNOSIS — E538 Deficiency of other specified B group vitamins: Secondary | ICD-10-CM

## 2015-10-26 MED ORDER — CYANOCOBALAMIN 1000 MCG/ML IJ SOLN
1000.0000 ug | Freq: Once | INTRAMUSCULAR | Status: AC
  Administered 2015-10-26: 1000 ug via INTRAMUSCULAR

## 2015-10-26 NOTE — Progress Notes (Signed)
Pre visit review using our clinic review tool, if applicable. No additional management support is needed unless otherwise documented below in the visit note.  Patient in office today for B12 injection. IM given in Right Deltoid. Patient tolerated injection well. Next appointment scheduled for 11/26/15 at 2:00 PM.

## 2015-11-09 DIAGNOSIS — M4806 Spinal stenosis, lumbar region: Secondary | ICD-10-CM | POA: Diagnosis not present

## 2015-11-09 DIAGNOSIS — M4856XA Collapsed vertebra, not elsewhere classified, lumbar region, initial encounter for fracture: Secondary | ICD-10-CM | POA: Diagnosis not present

## 2015-11-09 DIAGNOSIS — M545 Low back pain: Secondary | ICD-10-CM | POA: Diagnosis not present

## 2015-11-26 ENCOUNTER — Ambulatory Visit: Payer: Commercial Managed Care - HMO

## 2015-11-30 ENCOUNTER — Ambulatory Visit (INDEPENDENT_AMBULATORY_CARE_PROVIDER_SITE_OTHER): Payer: Commercial Managed Care - HMO | Admitting: *Deleted

## 2015-11-30 ENCOUNTER — Ambulatory Visit: Payer: Commercial Managed Care - HMO

## 2015-11-30 DIAGNOSIS — E538 Deficiency of other specified B group vitamins: Secondary | ICD-10-CM

## 2015-11-30 MED ORDER — CYANOCOBALAMIN 1000 MCG/ML IJ SOLN
1000.0000 ug | Freq: Once | INTRAMUSCULAR | Status: AC
  Administered 2015-11-30: 1000 ug via INTRAMUSCULAR

## 2015-11-30 NOTE — Progress Notes (Signed)
Pre visit review using our clinic review tool, if applicable. No additional management support is needed unless otherwise documented below in the visit note.  Pt tolerated injection well.   Next appt: 12/28/15  Dorrene German, RN

## 2015-12-28 ENCOUNTER — Other Ambulatory Visit: Payer: Commercial Managed Care - HMO

## 2015-12-28 ENCOUNTER — Ambulatory Visit (INDEPENDENT_AMBULATORY_CARE_PROVIDER_SITE_OTHER): Payer: Commercial Managed Care - HMO | Admitting: Behavioral Health

## 2015-12-28 DIAGNOSIS — E538 Deficiency of other specified B group vitamins: Secondary | ICD-10-CM | POA: Diagnosis not present

## 2015-12-28 MED ORDER — CYANOCOBALAMIN 1000 MCG/ML IJ SOLN
1000.0000 ug | Freq: Once | INTRAMUSCULAR | Status: AC
  Administered 2015-12-28: 1000 ug via INTRAMUSCULAR

## 2015-12-28 NOTE — Progress Notes (Signed)
Pre visit review using our clinic review tool, if applicable. No additional management support is needed unless otherwise documented below in the visit note.  Patient in office today for B12 injection. IM given in Right Deltoid. Patient tolerated injection well. Next appointment scheduled for 02/02/16 at 2:45 PM.

## 2016-01-14 ENCOUNTER — Other Ambulatory Visit: Payer: Self-pay | Admitting: Family Medicine

## 2016-01-14 NOTE — Telephone Encounter (Signed)
Medication filled to pharmacy as requested.   

## 2016-01-28 ENCOUNTER — Telehealth: Payer: Self-pay | Admitting: Family Medicine

## 2016-01-28 MED ORDER — CEPHALEXIN 500 MG PO CAPS: 500.0000 mg | ORAL_CAPSULE | Freq: Two times a day (BID) | ORAL | Status: DC

## 2016-01-28 NOTE — Telephone Encounter (Signed)
Patient calling to report she just returned home from camping and has pain with urination.  States she took a AZO u/a test and it revealed no trace of blood but postive for infection.  Patient wants to know if rx can be sent in to pharmacy for her without an office visit due to the weekend and upcoming holiday.  Please advise.  Pharmacy: [facility], [place].

## 2016-01-28 NOTE — Telephone Encounter (Signed)
Medication filled to pharmacy as requested.   

## 2016-01-28 NOTE — Telephone Encounter (Signed)
Patient notified of PCP recommendations and is agreement and expresses an understanding.  

## 2016-01-28 NOTE — Telephone Encounter (Signed)
Ok for Keflex 500mg  BID x5 days but if no improvement she will need appt next week

## 2016-02-02 ENCOUNTER — Ambulatory Visit (INDEPENDENT_AMBULATORY_CARE_PROVIDER_SITE_OTHER): Payer: Medicare HMO | Admitting: Behavioral Health

## 2016-02-02 DIAGNOSIS — E538 Deficiency of other specified B group vitamins: Secondary | ICD-10-CM

## 2016-02-02 MED ORDER — CYANOCOBALAMIN 1000 MCG/ML IJ SOLN
1000.0000 ug | Freq: Once | INTRAMUSCULAR | Status: AC
  Administered 2016-02-02: 1000 ug via INTRAMUSCULAR

## 2016-02-02 NOTE — Progress Notes (Signed)
Pre visit review using our clinic review tool, if applicable. No additional management support is needed unless otherwise documented below in the visit note.  Patient in clinic today for B12 injection and lab. IM given in Left Deltoid. Patient tolerated injection well. After nurse visit, patient was directed to the lab to have B12 level drawn.

## 2016-02-03 LAB — VITAMIN B12: Vitamin B-12: >1500 pg/mL — ABNORMAL HIGH (ref 211–911)

## 2016-02-04 ENCOUNTER — Encounter: Payer: Self-pay | Admitting: General Practice

## 2016-02-04 DIAGNOSIS — M5416 Radiculopathy, lumbar region: Secondary | ICD-10-CM | POA: Diagnosis not present

## 2016-02-10 ENCOUNTER — Telehealth: Payer: Self-pay | Admitting: Family Medicine

## 2016-02-10 NOTE — Telephone Encounter (Signed)
Patient calling to report she has completed antibiotics for uti.  However, she continues to have symptoms.  She reports that she still does not have blood in urine.  However, her urine is dark but not has dark as it was before starting the antibiotic.    She wants to know if she needs a stronger antibiotics.  Phamacy: [facility], [place]

## 2016-02-10 NOTE — Telephone Encounter (Signed)
Pt as given abx due to the past holiday weekend. Pt will need an appt with Tabori since it has not resolved.

## 2016-02-10 NOTE — Telephone Encounter (Signed)
Pt notified and scheduled for tomorrow (02/11/16) at 1:15pm.

## 2016-02-11 ENCOUNTER — Encounter: Payer: Self-pay | Admitting: Family Medicine

## 2016-02-11 ENCOUNTER — Ambulatory Visit (INDEPENDENT_AMBULATORY_CARE_PROVIDER_SITE_OTHER): Payer: Medicare HMO | Admitting: Family Medicine

## 2016-02-11 VITALS — BP 130/70 | HR 78 | Temp 98.2°F | Resp 16 | Ht 64.0 in | Wt 230.2 lb

## 2016-02-11 DIAGNOSIS — R350 Frequency of micturition: Secondary | ICD-10-CM | POA: Diagnosis not present

## 2016-02-11 DIAGNOSIS — M549 Dorsalgia, unspecified: Secondary | ICD-10-CM

## 2016-02-11 DIAGNOSIS — R319 Hematuria, unspecified: Secondary | ICD-10-CM

## 2016-02-11 DIAGNOSIS — N390 Urinary tract infection, site not specified: Secondary | ICD-10-CM

## 2016-02-11 DIAGNOSIS — G8929 Other chronic pain: Secondary | ICD-10-CM

## 2016-02-11 DIAGNOSIS — R82998 Other abnormal findings in urine: Secondary | ICD-10-CM

## 2016-02-11 LAB — POCT URINALYSIS DIPSTICK
Bilirubin, UA: NEGATIVE
Glucose, UA: NEGATIVE
Ketones, UA: NEGATIVE
NITRITE UA: NEGATIVE
PH UA: 6
Spec Grav, UA: 1.01
UROBILINOGEN UA: 0.2

## 2016-02-11 MED ORDER — HYDROCODONE-ACETAMINOPHEN 5-325 MG PO TABS: 1.0000 | ORAL_TABLET | Freq: Four times a day (QID) | ORAL | Status: DC | PRN

## 2016-02-11 MED ORDER — CIPROFLOXACIN HCL 500 MG PO TABS: 500.0000 mg | ORAL_TABLET | Freq: Two times a day (BID) | ORAL | Status: DC

## 2016-02-11 MED ORDER — FLUCONAZOLE 150 MG PO TABS: 150.0000 mg | ORAL_TABLET | Freq: Once | ORAL | Status: DC

## 2016-02-11 NOTE — Progress Notes (Signed)
   Subjective:    Patient ID: Ashley Woods, female    DOB: Jan 21, 1946, 70 y.o.   MRN: [id number]  HPI UTI- pt was tx'd w/ Keflex on 6/30 after testing + on home UTI kit but she continues to have sxs.  Pt has pelvic pressure, increased urinary frequency, some incontinence.  'my urine smells like pneumonia'.  Some low back pain.  No fevers.  + dysuria.   Hx of pelvic sling w/ mesh.  She is worried that there may be something wrong w/ her mesh.  Also reports itching and vaginal burning after completing round of abx.   Review of Systems For ROS see HPI     Objective:   Physical Exam  Constitutional: She is oriented to person, place, and time. She appears well-developed and well-nourished. No distress.  obese  HENT:  Head: Normocephalic and atraumatic.  Abdominal: Soft. She exhibits no distension. There is tenderness (+ suprapubic and low back TTP but no CVA tenderness ).  Neurological: She is alert and oriented to person, place, and time.  Skin: Skin is warm and dry.  Psychiatric: She has a normal mood and affect. Her behavior is normal. Thought content normal.  Vitals reviewed.         Assessment & Plan:  UTI- pt's sxs and UA are consistent w/ infxn.  Since she recently completed Keflex w/o relief will switch to Cipro and send urine for culture.  If UTI is tx'd appropriately and sxs still don't improve, will refer to urology.  Pt expressed understanding and is in agreement w/ plan.   Vaginitis- likely due to recent abx use.  Diflucan as directed.

## 2016-02-11 NOTE — Patient Instructions (Signed)
Follow up as needed Drink plenty of fluids Start the Cipro twice daily- take w/ food Call with any questions or concerns If no improvement, will refer to urology. Hang in there!!!

## 2016-02-12 LAB — URINE CULTURE: Colony Count: 50000

## 2016-03-30 ENCOUNTER — Telehealth: Payer: Self-pay | Admitting: Emergency Medicine

## 2016-03-30 DIAGNOSIS — R350 Frequency of micturition: Secondary | ICD-10-CM

## 2016-03-30 NOTE — Telephone Encounter (Signed)
Patient called with similar symptoms from last visit. She is having dysuria, frequent urination, urinary leakage. Her symptoms improved with completion of Cipro antibiotic. Patient states from last visit if symptoms reoccur she would be referred to [facility]rology[facility] preference. Please advise.

## 2016-03-31 NOTE — Telephone Encounter (Signed)
This referral was placed today.  

## 2016-03-31 NOTE — Addendum Note (Signed)
Addended by: Davis Gourd on: 03/31/2016 08:11 AM   Modules accepted: Orders

## 2016-03-31 NOTE — Telephone Encounter (Signed)
Ok for referral?

## 2016-03-31 NOTE — Telephone Encounter (Signed)
Please refer to urology [facility] per pt preference), dx urinary frequency

## 2016-04-10 DIAGNOSIS — N952 Postmenopausal atrophic vaginitis: Secondary | ICD-10-CM | POA: Diagnosis not present

## 2016-04-10 DIAGNOSIS — R3989 Other symptoms and signs involving the genitourinary system: Secondary | ICD-10-CM | POA: Diagnosis not present

## 2016-04-10 DIAGNOSIS — R350 Frequency of micturition: Secondary | ICD-10-CM | POA: Diagnosis not present

## 2016-04-28 ENCOUNTER — Telehealth: Payer: Self-pay | Admitting: Family Medicine

## 2016-04-28 NOTE — Telephone Encounter (Signed)
Pt states that she needs refill on lisinopril, simvastatin, citalopram, and levothyroxine, [facility]. Pt is aware that [facility] is out of the office today and this would be addressed on Monday, pt was fine with that.

## 2016-05-01 DIAGNOSIS — R102 Pelvic and perineal pain: Secondary | ICD-10-CM | POA: Diagnosis not present

## 2016-05-01 DIAGNOSIS — N3281 Overactive bladder: Secondary | ICD-10-CM | POA: Diagnosis not present

## 2016-05-01 MED ORDER — LISINOPRIL 20 MG PO TABS: 20.0000 mg | ORAL_TABLET | Freq: Every day | ORAL | 1 refills | Status: DC

## 2016-05-01 MED ORDER — CITALOPRAM HYDROBROMIDE 40 MG PO TABS: 40.0000 mg | ORAL_TABLET | Freq: Every day | ORAL | 1 refills | Status: DC

## 2016-05-01 MED ORDER — LEVOTHYROXINE SODIUM 75 MCG PO TABS: 75.0000 ug | ORAL_TABLET | Freq: Every day | ORAL | 1 refills | Status: DC

## 2016-05-01 MED ORDER — SIMVASTATIN 40 MG PO TABS: 40.0000 mg | ORAL_TABLET | Freq: Every day | ORAL | 1 refills | Status: DC

## 2016-05-01 NOTE — Telephone Encounter (Signed)
Medication filled to pharmacy as requested.   

## 2016-05-24 DIAGNOSIS — Z79899 Other long term (current) drug therapy: Secondary | ICD-10-CM | POA: Diagnosis not present

## 2016-05-24 DIAGNOSIS — N3941 Urge incontinence: Secondary | ICD-10-CM | POA: Diagnosis not present

## 2016-05-24 DIAGNOSIS — Z7982 Long term (current) use of aspirin: Secondary | ICD-10-CM | POA: Diagnosis not present

## 2016-05-24 DIAGNOSIS — R350 Frequency of micturition: Secondary | ICD-10-CM | POA: Diagnosis not present

## 2016-05-24 DIAGNOSIS — L292 Pruritus vulvae: Secondary | ICD-10-CM | POA: Diagnosis not present

## 2016-05-24 DIAGNOSIS — E785 Hyperlipidemia, unspecified: Secondary | ICD-10-CM | POA: Diagnosis not present

## 2016-05-24 DIAGNOSIS — N905 Atrophy of vulva: Secondary | ICD-10-CM | POA: Diagnosis not present

## 2016-05-24 DIAGNOSIS — Z87891 Personal history of nicotine dependence: Secondary | ICD-10-CM | POA: Diagnosis not present

## 2016-05-24 DIAGNOSIS — I10 Essential (primary) hypertension: Secondary | ICD-10-CM | POA: Diagnosis not present

## 2016-05-24 DIAGNOSIS — J449 Chronic obstructive pulmonary disease, unspecified: Secondary | ICD-10-CM | POA: Diagnosis not present

## 2016-05-25 DIAGNOSIS — N3941 Urge incontinence: Secondary | ICD-10-CM | POA: Diagnosis not present

## 2016-05-25 DIAGNOSIS — N904 Leukoplakia of vulva: Secondary | ICD-10-CM | POA: Diagnosis not present

## 2016-05-25 DIAGNOSIS — E785 Hyperlipidemia, unspecified: Secondary | ICD-10-CM | POA: Diagnosis not present

## 2016-05-25 DIAGNOSIS — I10 Essential (primary) hypertension: Secondary | ICD-10-CM | POA: Diagnosis not present

## 2016-05-25 DIAGNOSIS — L292 Pruritus vulvae: Secondary | ICD-10-CM | POA: Diagnosis not present

## 2016-05-25 DIAGNOSIS — Z79899 Other long term (current) drug therapy: Secondary | ICD-10-CM | POA: Diagnosis not present

## 2016-05-25 DIAGNOSIS — N905 Atrophy of vulva: Secondary | ICD-10-CM | POA: Diagnosis not present

## 2016-05-25 DIAGNOSIS — J449 Chronic obstructive pulmonary disease, unspecified: Secondary | ICD-10-CM | POA: Diagnosis not present

## 2016-05-25 DIAGNOSIS — Z87891 Personal history of nicotine dependence: Secondary | ICD-10-CM | POA: Diagnosis not present

## 2016-05-25 DIAGNOSIS — Z7982 Long term (current) use of aspirin: Secondary | ICD-10-CM | POA: Diagnosis not present

## 2016-05-30 DIAGNOSIS — G4733 Obstructive sleep apnea (adult) (pediatric): Secondary | ICD-10-CM | POA: Diagnosis not present

## 2016-07-13 DIAGNOSIS — N3281 Overactive bladder: Secondary | ICD-10-CM | POA: Diagnosis not present

## 2016-07-13 DIAGNOSIS — Z7189 Other specified counseling: Secondary | ICD-10-CM | POA: Diagnosis not present

## 2016-07-13 DIAGNOSIS — E785 Hyperlipidemia, unspecified: Secondary | ICD-10-CM | POA: Diagnosis not present

## 2016-07-13 DIAGNOSIS — N9489 Other specified conditions associated with female genital organs and menstrual cycle: Secondary | ICD-10-CM | POA: Diagnosis not present

## 2016-07-13 DIAGNOSIS — J449 Chronic obstructive pulmonary disease, unspecified: Secondary | ICD-10-CM | POA: Diagnosis not present

## 2016-07-13 DIAGNOSIS — L900 Lichen sclerosus et atrophicus: Secondary | ICD-10-CM | POA: Diagnosis not present

## 2016-07-13 DIAGNOSIS — N3946 Mixed incontinence: Secondary | ICD-10-CM | POA: Diagnosis not present

## 2016-07-13 DIAGNOSIS — N3941 Urge incontinence: Secondary | ICD-10-CM | POA: Diagnosis not present

## 2016-07-13 DIAGNOSIS — I10 Essential (primary) hypertension: Secondary | ICD-10-CM | POA: Diagnosis not present

## 2016-07-13 DIAGNOSIS — R350 Frequency of micturition: Secondary | ICD-10-CM | POA: Diagnosis not present

## 2016-08-11 ENCOUNTER — Telehealth: Payer: Self-pay | Admitting: Family Medicine

## 2016-08-11 DIAGNOSIS — G8929 Other chronic pain: Secondary | ICD-10-CM

## 2016-08-11 DIAGNOSIS — M549 Dorsalgia, unspecified: Principal | ICD-10-CM

## 2016-08-11 MED ORDER — HYDROCODONE-ACETAMINOPHEN 5-325 MG PO TABS: 1.0000 | ORAL_TABLET | Freq: Four times a day (QID) | ORAL | 0 refills | Status: DC | PRN

## 2016-08-11 MED ORDER — CLONAZEPAM 0.5 MG PO TABS: ORAL_TABLET | ORAL | 0 refills | Status: DC

## 2016-08-11 NOTE — Telephone Encounter (Signed)
Called pt to inform that Hydrocodone is available at front desk for pick up, however phone just kept ringing. Clonazepam faxed to pharmacy today.

## 2016-08-11 NOTE — Telephone Encounter (Signed)
[facility] for #30 of each but pt has hx of HTN and hyperlipidemia and is overdue for appt.  No refills w/o appt  Indication for chronic opioid: chronic low back pain Medication and dose: Hydrocodone 5/325mg  # pills per month: <30 Last UDS date:   Pain contract signed (Y/N): Y Date narcotic database last reviewed (include red flags): Reviewed 08/11/16- no red flags

## 2016-08-11 NOTE — Telephone Encounter (Signed)
Last OV 02/11/16 (frequent urination), no upcoming physicals Hydrocodone last filled 02/11/16 #60 with 0 Clonazepam last filled 04/09/15 #45 with 0

## 2016-08-11 NOTE — Telephone Encounter (Signed)
Pt asking for a refill on Hydrocodone and clonazepam, [facility] on [address]

## 2016-08-16 ENCOUNTER — Ambulatory Visit: Payer: Medicare HMO

## 2016-08-22 ENCOUNTER — Ambulatory Visit (INDEPENDENT_AMBULATORY_CARE_PROVIDER_SITE_OTHER): Payer: Medicare HMO

## 2016-08-22 DIAGNOSIS — Z23 Encounter for immunization: Secondary | ICD-10-CM

## 2016-10-04 NOTE — Progress Notes (Deleted)
Pre visit review using our clinic review tool, if applicable. No additional management support is needed unless otherwise documented below in the visit note. 

## 2016-10-04 NOTE — Progress Notes (Deleted)
Subjective:   Ashley Woods is a 71 y.o. female who presents for Medicare Annual (Subsequent) preventive examination.  Review of Systems:  No ROS.  Medicare Wellness Visit.    Sleep patterns:  Home Safety/Smoke Alarms:   Living environment; residence and Firearm Safety:  Seat Belt Safety/Bike Helmet: Wears seat belt.   Counseling:   Eye Exam-  Dental-  Female:   [id number]       Mammo-09/22/2015,  Negative.      Dexa scan-07/22/2014,  Osteopenia.       CCS-Colonoscopy 05/30/2012, polyps. Recall 5 years.       Objective:     Vitals: There were no vitals taken for this visit.  There is no height or weight on file to calculate BMI.   Tobacco History  Smoking Status  . Former Smoker  . Packs/day: 0.40  . Years: 45.00  . Quit date: 04/30/2013  Smokeless Tobacco  . Never Used    Comment: former 1ppd x45 years.     Counseling given: Not Answered   Past Medical History:  Diagnosis Date  . Asthma   . Colon polyp    adenomatous  . Colon polyps   . COPD (chronic obstructive pulmonary disease) ([place])   . Depression   . Diverticulosis   . GERD (gastroesophageal reflux disease)   . Hemorrhoids   . Hyperlipidemia   . Hypertension   . IBS (irritable bowel syndrome)   . Macular degeneration    Dr. Hoyle Sauer  . Overactive bladder   . Rosacea   . Sleep apnea    uses cpap   Past Surgical History:  Procedure Laterality Date  . CERVICAL FUSION    . HERNIA REPAIR    . OOPHORECTOMY    . PARTIAL HYSTERECTOMY    . SHOULDER SURGERY    . TONSILLECTOMY     Family History  Problem Relation Age of Onset  . Heart disease Daughter   . [place] White syndrome Daughter   . Colon cancer Neg Hx   . Inflammatory bowel disease Neg Hx    History  Sexual Activity  . Sexual activity: Not on file    Outpatient Encounter Prescriptions as of 10/05/2016  Medication Sig  . albuterol (PROVENTIL) (2.5 MG/3ML) 0.083% nebulizer solution Take 3 mLs (2.5 mg total) by  nebulization every 4 (four) hours as needed for wheezing or shortness of breath. Please dispense individual nebules  . aspirin 81 MG tablet   . Bromfenac Sodium (PROLENSA) 0.07 % SOLN Apply to eye.  . ciprofloxacin (CIPRO) 500 MG tablet Take 1 tablet (500 mg total) by mouth 2 (two) times daily.  . citalopram (CELEXA) 40 MG tablet Take 1 tablet (40 mg total) by mouth daily.  . clonazePAM (KLONOPIN) 0.5 MG tablet TAKE ONE TABLET BY MOUTH AT BEDTIME AS NEEDED  . Diphenhyd-Hydrocort-Nystatin (FIRST-DUKES MOUTHWASH) SUSP Use as directed in the mouth or throat 3 (three) times a day. SWISH IN MOUTH  . fluconazole (DIFLUCAN) 150 MG tablet Take 1 tablet (150 mg total) by mouth once.  . furosemide (LASIX) 20 MG tablet Take 2 tablets (40 mg total) by mouth daily. (Patient not taking: Reported on 02/11/2016)  . HYDROcodone-acetaminophen (NORCO/VICODIN) 5-325 MG tablet Take 1 tablet by mouth every 6 (six) hours as needed.  Marland Kitchen levothyroxine (SYNTHROID, LEVOTHROID) 75 MCG tablet Take 1 tablet (75 mcg total) by mouth daily.  Marland Kitchen lisinopril (PRINIVIL,ZESTRIL) 20 MG tablet Take 1 tablet (20 mg total) by mouth daily.  . meloxicam (MOBIC)  15 MG tablet Take 15 mg by mouth daily.   Marland Kitchen moxifloxacin (VIGAMOX) 0.5 % ophthalmic solution 1 drop.  Marland Kitchen omeprazole (PRILOSEC) 40 MG capsule Take 40 mg by mouth daily.    . RESTASIS 0.05 % ophthalmic emulsion Place 1 drop into both eyes 2 (two) times daily.   . simvastatin (ZOCOR) 40 MG tablet Take 1 tablet (40 mg total) by mouth at bedtime.  Marland Kitchen tiZANidine (ZANAFLEX) 4 MG tablet Take 1 tablet (4 mg total) by mouth at bedtime.   No facility-administered encounter medications on file as of 10/05/2016.     Activities of Daily Living No flowsheet data found.  Patient Care Team: Midge Minium, MD as PCP - General Irene Shipper, MD as Consulting Physician (Gastroenterology) Amy Curley Spice, OD as Consulting Physician (Optometry) Zack Seal, MD as Consulting Physician  (Rehabilitation)    Assessment:    Physical assessment deferred to PCP.  Exercise Activities and Dietary recommendations   Diet (meal preparation, eat out, water intake, caffeinated beverages, dairy products, fruits and vegetables):   Breakfast: Lunch:  Dinner:      Goals    None     Fall Risk Fall Risk  09/17/2015 04/28/2015 11/10/2014 06/17/2013  Falls in the past year? No No No No   Depression Screen PHQ 2/9 Scores 09/17/2015 04/28/2015 11/10/2014 06/17/2013  PHQ - 2 Score 0 0 0 0  Exception Documentation Patient refusal - - -     Cognitive Function        Immunization History  Administered Date(s) Administered  . Influenza Split 05/08/2012  . Influenza Whole 06/18/2008  . Influenza,inj,Quad PF,36+ Mos 04/28/2015, 08/22/2016  . Pneumococcal Conjugate-13 04/28/2015  . Pneumococcal Polysaccharide-23 05/08/2012   Screening Tests Health Maintenance  Topic Date Due  . Hepatitis C Screening  09/15/1945  . TETANUS/TDAP  04/18/1965  . MAMMOGRAM  09/21/2016  . COLONOSCOPY  05/30/2017  . INFLUENZA VACCINE  Completed  . DEXA SCAN  Completed  . PNA vac Low Risk Adult  Completed      Plan:    During the course of the visit the patient was educated and counseled about the following appropriate screening and preventive services:   Vaccines to include Pneumoccal, Influenza, Hepatitis B, Td, Zostavax, HCV  Cardiovascular Disease  Colorectal cancer screening  Bone density screening  Diabetes screening  Glaucoma screening  Mammography/PAP  Nutrition counseling   Patient Instructions (the written plan) was given to the patient.   Gerilyn Nestle, RN  10/04/2016

## 2016-10-05 ENCOUNTER — Ambulatory Visit: Payer: Medicare HMO

## 2016-10-11 NOTE — Progress Notes (Addendum)
Subjective:   Ashley Woods is a 71 y.o. female who presents for Medicare Annual (Subsequent) preventive examination.  Review of Systems:  No ROS.  Medicare Wellness Visit.  Cardiac Risk Factors include: advanced age (>40men, >34 women);dyslipidemia;family history of premature cardiovascular disease;hypertension;obesity (BMI >30kg/m2);sedentary lifestyle   Sleep patterns: Sleeps about 9-10 hours, uses BIPAP machine. Concerned machine is not working properly, plans to call.  Home Safety/Smoke Alarms:  Smoke detectors and security in place.  Living environment; residence and Firearm Safety: Lives with husband, daughter and brother in 1 story home, rail at steps. Feels safe at home. Firearms locked away.  Seat Belt Safety/Bike Helmet: Wears seat belt.   Counseling:   Eye Exam-Last exam < 2 years.  Dr. Jodi [facility]-Full dentures, gum care discussed.   Female:   [id number]       Mammo-09/22/2015, negative. Order placed.  Dexa scan-07/22/2014, Osteopenia.  Order placed    CCS-colonoscopy 05/30/2012, polyp. Recall 5 years.        Objective:     Vitals: BP 128/78 (BP Location: Right Arm, Patient Position: Sitting, Cuff Size: Normal)   Pulse 63   Ht 5\' 4"  (1.626 m)   Wt 233 lb 9.6 oz (106 kg)   SpO2 95%   BMI 40.10 kg/m   Body mass index is 40.1 kg/m.   Tobacco History  Smoking Status  . Former Smoker  . Packs/day: 0.40  . Years: 45.00  . Quit date: 04/30/2013  Smokeless Tobacco  . Never Used    Comment: former 1ppd x45 years.     Counseling given: No   Past Medical History:  Diagnosis Date  . Asthma   . Colon polyp    adenomatous  . Colon polyps   . COPD (chronic obstructive pulmonary disease) ([place])   . Depression   . Diverticulosis   . GERD (gastroesophageal reflux disease)   . Hemorrhoids   . Hyperlipidemia   . Hypertension   . IBS (irritable bowel syndrome)   . Macular degeneration    Dr. Hoyle Sauer  . Overactive bladder   . Rosacea   . Sleep  apnea    uses cpap   Past Surgical History:  Procedure Laterality Date  . CERVICAL FUSION    . HERNIA REPAIR    . OOPHORECTOMY    . PARTIAL HYSTERECTOMY    . SHOULDER SURGERY    . TONSILLECTOMY     Family History  Problem Relation Age of Onset  . Heart disease Daughter   . [place] White syndrome Daughter   . Colon cancer Neg Hx   . Inflammatory bowel disease Neg Hx    History  Sexual Activity  . Sexual activity: Not on file    Outpatient Encounter Prescriptions as of 10/12/2016  Medication Sig  . aspirin 81 MG tablet   . citalopram (CELEXA) 40 MG tablet Take 1 tablet (40 mg total) by mouth daily.  . clobetasol ointment (TEMOVATE) 0.05 % Apply 1 finger tip (approximately 0.5g) of ointment to the affected area nightly-- spread into a thin film.  . clonazePAM (KLONOPIN) 0.5 MG tablet TAKE ONE TABLET BY MOUTH AT BEDTIME AS NEEDED (Patient taking differently: TAKE ONE TABLET BY MOUTH AT BEDTIME AS NEEDED)  . conjugated estrogens (PREMARIN) vaginal cream Insert pea sized amount per vagina nightly x 3 weeks, then every other night.  . Difluprednate (DUREZOL) 0.05 % EMUL 1 drop. TO START AFTER SURGERY AS DIRECTED  . Diphenhyd-Hydrocort-Nystatin (FIRST-DUKES MOUTHWASH) SUSP Use as directed in  the mouth or throat 3 (three) times a day. SWISH IN MOUTH  . furosemide (LASIX) 20 MG tablet Take 2 tablets (40 mg total) by mouth daily. (Patient taking differently: Take 40 mg by mouth daily. )  . HYDROcodone-acetaminophen (NORCO/VICODIN) 5-325 MG tablet Take 1 tablet by mouth every 6 (six) hours as needed.  Marland Kitchen levothyroxine (SYNTHROID, LEVOTHROID) 75 MCG tablet Take 1 tablet (75 mcg total) by mouth daily.  Marland Kitchen lisinopril (PRINIVIL,ZESTRIL) 20 MG tablet Take 1 tablet (20 mg total) by mouth daily.  Marland Kitchen loperamide (IMODIUM A-D) 2 MG tablet Take 2 mg by mouth.  . oxybutynin (DITROPAN) 5 MG tablet Take 5 mg by mouth.  . RESTASIS 0.05 % ophthalmic emulsion Place 1 drop into both eyes 2 (two) times  daily.   . simvastatin (ZOCOR) 40 MG tablet Take 1 tablet (40 mg total) by mouth at bedtime.  Marland Kitchen albuterol (PROVENTIL) (2.5 MG/3ML) 0.083% nebulizer solution Take 3 mLs (2.5 mg total) by nebulization every 4 (four) hours as needed for wheezing or shortness of breath. Please dispense individual nebules  . Bromfenac Sodium (PROLENSA) 0.07 % SOLN Apply to eye.  . fluconazole (DIFLUCAN) 150 MG tablet Take 1 tablet (150 mg total) by mouth once. (Patient not taking: Reported on 10/12/2016)  . meloxicam (MOBIC) 15 MG tablet Take 15 mg by mouth daily.   Marland Kitchen moxifloxacin (VIGAMOX) 0.5 % ophthalmic solution 1 drop.  Marland Kitchen omeprazole (PRILOSEC) 40 MG capsule Take 40 mg by mouth daily.    Marland Kitchen tiZANidine (ZANAFLEX) 4 MG tablet Take 1 tablet (4 mg total) by mouth at bedtime. (Patient not taking: Reported on 10/12/2016)  . [DISCONTINUED] ciprofloxacin (CIPRO) 500 MG tablet Take 1 tablet (500 mg total) by mouth 2 (two) times daily.   No facility-administered encounter medications on file as of 10/12/2016.     Activities of Daily Living In your present state of health, do you have any difficulty performing the following activities: 10/12/2016  Hearing? N  Vision? N  Difficulty concentrating or making decisions? N  Walking or climbing stairs? N  Dressing or bathing? N  Doing errands, shopping? N  Preparing Food and eating ? N  Using the Toilet? N  In the past six months, have you accidently leaked urine? Y  Do you have problems with loss of bowel control? N  Managing your Medications? N  Managing your Finances? N  Housekeeping or managing your Housekeeping? N  Some recent data might be hidden    Patient Care Team: Midge Minium, MD as PCP - General Irene Shipper, MD as Consulting Physician (Gastroenterology) Amy Curley Spice, OD as Consulting Physician (Optometry) Candace Parker-Autry (Obstetrics and Gynecology)    Assessment:    Physical assessment deferred to PCP.  Exercise Activities and Dietary  recommendations Current Exercise Habits: The patient does not participate in regular exercise at present (Maintain house, shopping), Exercise limited by: orthopedic condition(s) (back and knee)   Diet (meal preparation, eat out, water intake, caffeinated beverages, dairy products, fruits and vegetables): Drinks water and soda.   Breakfast: Muffins, coffee (3 cups) Lunch: sandwich Dinner: protein, vegetable, starch  Discussed heart healthy diet and increasing activity.   Goals    . Weight (lb) < 180 lb (81.6 kg)          Would like to lose weight, plans to cut back on portion. Increase activity by starting to go to [facility].       Fall Risk Fall Risk  10/12/2016 09/17/2015 04/28/2015 11/10/2014 06/17/2013  Falls  in the past year? Yes No No No No  Number falls in past yr: 2 or more - - - -  Injury with Fall? No - - - -  Follow up Falls prevention discussed - - - -   Depression Screen PHQ 2/9 Scores 10/12/2016 09/17/2015 04/28/2015 11/10/2014  PHQ - 2 Score 0 0 0 0  Exception Documentation - Patient refusal - -     Cognitive Function MMSE - Mini Mental State Exam 10/12/2016  Orientation to time 5  Orientation to Place 5  Registration 3  Attention/ Calculation 5  Recall 2  Language- name 2 objects 2  Language- repeat 1  Language- follow 3 step command 3  Language- read & follow direction 1  Write a sentence 1  Copy design 1  Total score 29        Immunization History  Administered Date(s) Administered  . Influenza Split 05/08/2012  . Influenza Whole 06/18/2008  . Influenza,inj,Quad PF,36+ Mos 04/28/2015, 08/22/2016  . Pneumococcal Conjugate-13 04/28/2015  . Pneumococcal Polysaccharide-23 05/08/2012   Screening Tests Health Maintenance  Topic Date Due  . MAMMOGRAM  09/21/2016  . Hepatitis C Screening  01/28/2017 (Originally 04/04/1946)  . TETANUS/TDAP  10/12/2017 (Originally 04/18/1965)  . COLONOSCOPY  05/30/2017  . INFLUENZA VACCINE  Completed  . DEXA SCAN  Completed  .  PNA vac Low Risk Adult  Completed   Would like Hepatitis C Screening with next lab draw for CPE (July 2018).     Plan:     Continue doing brain stimulating activities (puzzles, reading, adult coloring books, staying active) to keep memory sharp.   Eat heart healthy diet (full of fruits, vegetables, whole grains, lean protein, water--limit salt, fat, and sugar intake) and increase physical activity as tolerated.  Bring a copy of your advance directives to your next office visit.  Call about BIPAP machine.   During the course of the visit the patient was educated and counseled about the following appropriate screening and preventive services:   Vaccines to include Pneumoccal, Influenza, Hepatitis B, Td, Zostavax, HCV  Cardiovascular Disease  Colorectal cancer screening  Bone density screening  Diabetes screening  Glaucoma screening  Mammography/PAP  Nutrition counseling   Patient Instructions (the written plan) was given to the patient.   Gerilyn Nestle, RN  10/12/2016   Reviewed and agree w/ above information  Annye Asa, MD

## 2016-10-11 NOTE — Progress Notes (Signed)
Pre visit review using our clinic review tool, if applicable. No additional management support is needed unless otherwise documented below in the visit note. 

## 2016-10-12 ENCOUNTER — Ambulatory Visit (INDEPENDENT_AMBULATORY_CARE_PROVIDER_SITE_OTHER): Payer: Medicare HMO

## 2016-10-12 VITALS — BP 128/78 | HR 63 | Ht 64.0 in | Wt 233.6 lb

## 2016-10-12 DIAGNOSIS — Z1159 Encounter for screening for other viral diseases: Secondary | ICD-10-CM | POA: Diagnosis not present

## 2016-10-12 DIAGNOSIS — Z0000 Encounter for general adult medical examination without abnormal findings: Secondary | ICD-10-CM

## 2016-10-12 DIAGNOSIS — Z1231 Encounter for screening mammogram for malignant neoplasm of breast: Secondary | ICD-10-CM

## 2016-10-12 DIAGNOSIS — Z1239 Encounter for other screening for malignant neoplasm of breast: Secondary | ICD-10-CM

## 2016-10-12 DIAGNOSIS — E2839 Other primary ovarian failure: Secondary | ICD-10-CM | POA: Diagnosis not present

## 2016-10-12 NOTE — Patient Instructions (Addendum)
Continue doing brain stimulating activities (puzzles, reading, adult coloring books, staying active) to keep memory sharp.   Eat heart healthy diet (full of fruits, vegetables, whole grains, lean protein, water--limit salt, fat, and sugar intake) and increase physical activity as tolerated.  Bring a copy of your advance directives to your next office visit.  Call about BIPAP machine.   Fall Prevention in the Home Falls can cause injuries. They can happen to people of all ages. There are many things you can do to make your home safe and to help prevent falls. What can I do on the outside of my home?  Regularly fix the edges of walkways and driveways and fix any cracks.  Remove anything that might make you trip as you walk through a door, such as a raised step or threshold.  Trim any bushes or trees on the path to your home.  Use bright outdoor lighting.  Clear any walking paths of anything that might make someone trip, such as rocks or tools.  Regularly check to see if handrails are loose or broken. Make sure that both sides of any steps have handrails.  Any raised decks and porches should have guardrails on the edges.  Have any leaves, snow, or ice cleared regularly.  Use sand or salt on walking paths during winter.  Clean up any spills in your garage right away. This includes oil or grease spills. What can I do in the bathroom?  Use night lights.  Install grab bars by the toilet and in the tub and shower. Do not use towel bars as grab bars.  Use non-skid mats or decals in the tub or shower.  If you need to sit down in the shower, use a plastic, non-slip stool.  Keep the floor dry. Clean up any water that spills on the floor as soon as it happens.  Remove soap buildup in the tub or shower regularly.  Attach bath mats securely with double-sided non-slip rug tape.  Do not have throw rugs and other things on the floor that can make you trip. What can I do in the  bedroom?  Use night lights.  Make sure that you have a light by your bed that is easy to reach.  Do not use any sheets or blankets that are too big for your bed. They should not hang down onto the floor.  Have a firm chair that has side arms. You can use this for support while you get dressed.  Do not have throw rugs and other things on the floor that can make you trip. What can I do in the kitchen?  Clean up any spills right away.  Avoid walking on wet floors.  Keep items that you use a lot in easy-to-reach places.  If you need to reach something above you, use a strong step stool that has a grab bar.  Keep electrical cords out of the way.  Do not use floor polish or wax that makes floors slippery. If you must use wax, use non-skid floor wax.  Do not have throw rugs and other things on the floor that can make you trip. What can I do with my stairs?  Do not leave any items on the stairs.  Make sure that there are handrails on both sides of the stairs and use them. Fix handrails that are broken or loose. Make sure that handrails are as long as the stairways.  Check any carpeting to make sure that it is firmly attached  to the stairs. Fix any carpet that is loose or worn.  Avoid having throw rugs at the top or bottom of the stairs. If you do have throw rugs, attach them to the floor with carpet tape.  Make sure that you have a light switch at the top of the stairs and the bottom of the stairs. If you do not have them, ask someone to add them for you. What else can I do to help prevent falls?  Wear shoes that:  Do not have high heels.  Have rubber bottoms.  Are comfortable and fit you well.  Are closed at the toe. Do not wear sandals.  If you use a stepladder:  Make sure that it is fully opened. Do not climb a closed stepladder.  Make sure that both sides of the stepladder are locked into place.  Ask someone to hold it for you, if possible.  Clearly mark and make  sure that you can see:  Any grab bars or handrails.  First and last steps.  Where the edge of each step is.  Use tools that help you move around (mobility aids) if they are needed. These include:  Canes.  Walkers.  Scooters.  Crutches.  Turn on the lights when you go into a dark area. Replace any light bulbs as soon as they burn out.  Set up your furniture so you have a clear path. Avoid moving your furniture around.  If any of your floors are uneven, fix them.  If there are any pets around you, be aware of where they are.  Review your medicines with your doctor. Some medicines can make you feel dizzy. This can increase your chance of falling. Ask your doctor what other things that you can do to help prevent falls. This information is not intended to replace advice given to you by your health care provider. Make sure you discuss any questions you have with your health care provider. Document Released: 05/13/2009 Document Revised: 12/23/2015 Document Reviewed: 08/21/2014 Elsevier Interactive Patient Education  2017 [place] Maintenance, Female Adopting a healthy lifestyle and getting preventive care can go a long way to promote health and wellness. Talk with your health care provider about what schedule of regular examinations is right for you. This is a good chance for you to check in with your provider about disease prevention and staying healthy. In between checkups, there are plenty of things you can do on your own. Experts have done a lot of research about which lifestyle changes and preventive measures are most likely to keep you healthy. Ask your health care provider for more information. Weight and diet Eat a healthy diet  Be sure to include plenty of vegetables, fruits, low-fat dairy products, and lean protein.  Do not eat a lot of foods high in solid fats, added sugars, or salt.  Get regular exercise. This is one of the most important things you can do  for your health.  Most adults should exercise for at least 150 minutes each week. The exercise should increase your heart rate and make you sweat (moderate-intensity exercise).  Most adults should also do strengthening exercises at least twice a week. This is in addition to the moderate-intensity exercise. Maintain a healthy weight  Body mass index (BMI) is a measurement that can be used to identify possible weight problems. It estimates body fat based on height and weight. Your health care provider can help determine your BMI and help you achieve or maintain  a healthy weight.  For females 59 years of age and older:  A BMI below 18.5 is considered underweight.  A BMI of 18.5 to 24.9 is normal.  A BMI of 25 to 29.9 is considered overweight.  A BMI of 30 and above is considered obese. Watch levels of cholesterol and blood lipids  You should start having your blood tested for lipids and cholesterol at 71 years of age, then have this test every 5 years.  You may need to have your cholesterol levels checked more often if:  Your lipid or cholesterol levels are high.  You are older than 71 years of age.  You are at high risk for heart disease. Cancer screening Lung Cancer  Lung cancer screening is recommended for adults 29-67 years old who are at high risk for lung cancer because of a history of smoking.  A yearly low-dose CT scan of the lungs is recommended for people who:  Currently smoke.  Have quit within the past 15 years.  Have at least a 30-pack-year history of smoking. A pack year is smoking an average of one pack of cigarettes a day for 1 year.  Yearly screening should continue until it has been 15 years since you quit.  Yearly screening should stop if you develop a health problem that would prevent you from having lung cancer treatment. Breast Cancer  Practice breast self-awareness. This means understanding how your breasts normally appear and feel.  It also means  doing regular breast self-exams. Let your health care provider know about any changes, no matter how small.  If you are in your 20s or 30s, you should have a clinical breast exam (CBE) by a health care provider every 1-3 years as part of a regular health exam.  If you are 78 or older, have a CBE every year. Also consider having a breast X-ray (mammogram) every year.  If you have a family history of breast cancer, talk to your health care provider about genetic screening.  If you are at high risk for breast cancer, talk to your health care provider about having an MRI and a mammogram every year.  Breast cancer gene (BRCA) assessment is recommended for women who have family members with BRCA-related cancers. BRCA-related cancers include:  Breast.  Ovarian.  Tubal.  Peritoneal cancers.  Results of the assessment will determine the need for genetic counseling and BRCA1 and BRCA2 testing. Cervical Cancer  Your health care provider may recommend that you be screened regularly for cancer of the pelvic organs (ovaries, uterus, and vagina). This screening involves a pelvic examination, including checking for microscopic changes to the surface of your cervix (Pap test). You may be encouraged to have this screening done every 3 years, beginning at age 22.  For women ages 44-65, health care providers may recommend pelvic exams and Pap testing every 3 years, or they may recommend the Pap and pelvic exam, combined with testing for human papilloma virus (HPV), every 5 years. Some types of HPV increase your risk of cervical cancer. Testing for HPV may also be done on women of any age with unclear Pap test results.  Other health care providers may not recommend any screening for nonpregnant women who are considered low risk for pelvic cancer and who do not have symptoms. Ask your health care provider if a screening pelvic exam is right for you.  If you have had past treatment for cervical cancer or a  condition that could lead to cancer, you need  Pap tests and screening for cancer for at least 20 years after your treatment. If Pap tests have been discontinued, your risk factors (such as having a new sexual partner) need to be reassessed to determine if screening should resume. Some women have medical problems that increase the chance of getting cervical cancer. In these cases, your health care provider may recommend more frequent screening and Pap tests. Colorectal Cancer  This type of cancer can be detected and often prevented.  Routine colorectal cancer screening usually begins at 71 years of age and continues through 71 years of age.  Your health care provider may recommend screening at an earlier age if you have risk factors for colon cancer.  Your health care provider may also recommend using home test kits to check for hidden blood in the stool.  A small camera at the end of a tube can be used to examine your colon directly (sigmoidoscopy or colonoscopy). This is done to check for the earliest forms of colorectal cancer.  Routine screening usually begins at age 41.  Direct examination of the colon should be repeated every 5-10 years through 71 years of age. However, you may need to be screened more often if early forms of precancerous polyps or small growths are found. Skin Cancer  Check your skin from head to toe regularly.  Tell your health care provider about any new moles or changes in moles, especially if there is a change in a mole's shape or color.  Also tell your health care provider if you have a mole that is larger than the size of a pencil eraser.  Always use sunscreen. Apply sunscreen liberally and repeatedly throughout the day.  Protect yourself by wearing long sleeves, pants, a wide-brimmed hat, and sunglasses whenever you are outside. Heart disease, diabetes, and high blood pressure  High blood pressure causes heart disease and increases the risk of stroke. High  blood pressure is more likely to develop in:  People who have blood pressure in the high end of the normal range (130-139/85-89 mm Hg).  People who are overweight or obese.  People who are African American.  If you are 45-3 years of age, have your blood pressure checked every 3-5 years. If you are 38 years of age or older, have your blood pressure checked every year. You should have your blood pressure measured twice-once when you are at a hospital or clinic, and once when you are not at a hospital or clinic. Record the average of the two measurements. To check your blood pressure when you are not at a hospital or clinic, you can use:  An automated blood pressure machine at a pharmacy.  A home blood pressure monitor.  If you are between 1 years and 6 years old, ask your health care provider if you should take aspirin to prevent strokes.  Have regular diabetes screenings. This involves taking a blood sample to check your fasting blood sugar level.  If you are at a normal weight and have a low risk for diabetes, have this test once every three years after 71 years of age.  If you are overweight and have a high risk for diabetes, consider being tested at a younger age or more often. Preventing infection Hepatitis B  If you have a higher risk for hepatitis B, you should be screened for this virus. You are considered at high risk for hepatitis B if:  You were born in a country where hepatitis B is common. Ask your  health care provider which countries are considered high risk.  Your parents were born in a high-risk country, and you have not been immunized against hepatitis B (hepatitis B vaccine).  You have HIV or AIDS.  You use needles to inject street drugs.  You live with someone who has hepatitis B.  You have had sex with someone who has hepatitis B.  You get hemodialysis treatment.  You take certain medicines for conditions, including cancer, organ transplantation, and  autoimmune conditions. Hepatitis C  Blood testing is recommended for:  Everyone born from [birth year] through 1965.  Anyone with known risk factors for hepatitis C. Sexually transmitted infections (STIs)  You should be screened for sexually transmitted infections (STIs) including gonorrhea and chlamydia if:  You are sexually active and are younger than 71 years of age.  You are older than 71 years of age and your health care provider tells you that you are at risk for this type of infection.  Your sexual activity has changed since you were last screened and you are at an increased risk for chlamydia or gonorrhea. Ask your health care provider if you are at risk.  If you do not have HIV, but are at risk, it may be recommended that you take a prescription medicine daily to prevent HIV infection. This is called pre-exposure prophylaxis (PrEP). You are considered at risk if:  You are sexually active and do not regularly use condoms or know the HIV status of your partner(s).  You take drugs by injection.  You are sexually active with a partner who has HIV. Talk with your health care provider about whether you are at high risk of being infected with HIV. If you choose to begin PrEP, you should first be tested for HIV. You should then be tested every 3 months for as long as you are taking PrEP. Pregnancy  If you are premenopausal and you may become pregnant, ask your health care provider about preconception counseling.  If you may become pregnant, take 400 to 800 micrograms (mcg) of folic acid every day.  If you want to prevent pregnancy, talk to your health care provider about birth control (contraception). Osteoporosis and menopause  Osteoporosis is a disease in which the bones lose minerals and strength with aging. This can result in serious bone fractures. Your risk for osteoporosis can be identified using a bone density scan.  If you are 88 years of age or older, or if you are at risk for  osteoporosis and fractures, ask your health care provider if you should be screened.  Ask your health care provider whether you should take a calcium or vitamin D supplement to lower your risk for osteoporosis.  Menopause may have certain physical symptoms and risks.  Hormone replacement therapy may reduce some of these symptoms and risks. Talk to your health care provider about whether hormone replacement therapy is right for you. Follow these instructions at home:  Schedule regular health, dental, and eye exams.  Stay current with your immunizations.  Do not use any tobacco products including cigarettes, chewing tobacco, or electronic cigarettes.  If you are pregnant, do not drink alcohol.  If you are breastfeeding, limit how much and how often you drink alcohol.  Limit alcohol intake to no more than 1 drink per day for nonpregnant women. One drink equals 12 ounces of beer, 5 ounces of wine, or 1 ounces of hard liquor.  Do not use street drugs.  Do not share needles.  Ask your  health care provider for help if you need support or information about quitting drugs.  Tell your health care provider if you often feel depressed.  Tell your health care provider if you have ever been abused or do not feel safe at home. This information is not intended to replace advice given to you by your health care provider. Make sure you discuss any questions you have with your health care provider. Document Released: 01/30/2011 Document Revised: 12/23/2015 Document Reviewed: 04/20/2015 Elsevier Interactive Patient Education  2017 [employer].

## 2016-10-26 ENCOUNTER — Other Ambulatory Visit: Payer: Self-pay | Admitting: Family Medicine

## 2016-11-21 ENCOUNTER — Ambulatory Visit: Payer: Medicare HMO

## 2016-11-21 ENCOUNTER — Other Ambulatory Visit: Payer: Medicare HMO

## 2016-12-01 ENCOUNTER — Other Ambulatory Visit: Payer: Medicare HMO

## 2016-12-01 ENCOUNTER — Ambulatory Visit: Payer: Medicare HMO

## 2017-01-03 DIAGNOSIS — S0011XA Contusion of right eyelid and periocular area, initial encounter: Secondary | ICD-10-CM | POA: Diagnosis not present

## 2017-01-03 DIAGNOSIS — S8001XA Contusion of right knee, initial encounter: Secondary | ICD-10-CM | POA: Diagnosis not present

## 2017-01-03 DIAGNOSIS — M7989 Other specified soft tissue disorders: Secondary | ICD-10-CM | POA: Diagnosis not present

## 2017-01-03 DIAGNOSIS — S0083XA Contusion of other part of head, initial encounter: Secondary | ICD-10-CM | POA: Diagnosis not present

## 2017-01-03 DIAGNOSIS — S60221A Contusion of right hand, initial encounter: Secondary | ICD-10-CM | POA: Diagnosis not present

## 2017-01-03 DIAGNOSIS — Y9289 Other specified places as the place of occurrence of the external cause: Secondary | ICD-10-CM | POA: Diagnosis not present

## 2017-01-03 DIAGNOSIS — S62310A Displaced fracture of base of second metacarpal bone, right hand, initial encounter for closed fracture: Secondary | ICD-10-CM | POA: Diagnosis not present

## 2017-01-03 DIAGNOSIS — W1839XA Other fall on same level, initial encounter: Secondary | ICD-10-CM | POA: Diagnosis not present

## 2017-01-03 DIAGNOSIS — S0990XA Unspecified injury of head, initial encounter: Secondary | ICD-10-CM | POA: Diagnosis not present

## 2017-01-03 DIAGNOSIS — S060X9A Concussion with loss of consciousness of unspecified duration, initial encounter: Secondary | ICD-10-CM | POA: Diagnosis not present

## 2017-01-03 DIAGNOSIS — G44309 Post-traumatic headache, unspecified, not intractable: Secondary | ICD-10-CM | POA: Diagnosis not present

## 2017-01-03 DIAGNOSIS — S0993XA Unspecified injury of face, initial encounter: Secondary | ICD-10-CM | POA: Diagnosis not present

## 2017-01-17 DIAGNOSIS — M79641 Pain in right hand: Secondary | ICD-10-CM | POA: Diagnosis not present

## 2017-01-17 DIAGNOSIS — S62346A Nondisplaced fracture of base of fifth metacarpal bone, right hand, initial encounter for closed fracture: Secondary | ICD-10-CM | POA: Diagnosis not present

## 2017-01-17 DIAGNOSIS — M25531 Pain in right wrist: Secondary | ICD-10-CM | POA: Diagnosis not present

## 2017-01-24 DIAGNOSIS — G4733 Obstructive sleep apnea (adult) (pediatric): Secondary | ICD-10-CM | POA: Diagnosis not present

## 2017-02-14 ENCOUNTER — Ambulatory Visit (INDEPENDENT_AMBULATORY_CARE_PROVIDER_SITE_OTHER): Payer: Medicare HMO | Admitting: Family Medicine

## 2017-02-14 ENCOUNTER — Encounter: Payer: Self-pay | Admitting: Family Medicine

## 2017-02-14 VITALS — BP 133/82 | HR 80 | Temp 98.6°F | Resp 16 | Ht 64.0 in | Wt 237.0 lb

## 2017-02-14 DIAGNOSIS — E038 Other specified hypothyroidism: Secondary | ICD-10-CM

## 2017-02-14 DIAGNOSIS — Z1231 Encounter for screening mammogram for malignant neoplasm of breast: Secondary | ICD-10-CM

## 2017-02-14 DIAGNOSIS — I10 Essential (primary) hypertension: Secondary | ICD-10-CM | POA: Diagnosis not present

## 2017-02-14 DIAGNOSIS — E559 Vitamin D deficiency, unspecified: Secondary | ICD-10-CM

## 2017-02-14 DIAGNOSIS — G8929 Other chronic pain: Secondary | ICD-10-CM

## 2017-02-14 DIAGNOSIS — Z0000 Encounter for general adult medical examination without abnormal findings: Secondary | ICD-10-CM

## 2017-02-14 DIAGNOSIS — E785 Hyperlipidemia, unspecified: Secondary | ICD-10-CM | POA: Diagnosis not present

## 2017-02-14 DIAGNOSIS — M549 Dorsalgia, unspecified: Secondary | ICD-10-CM

## 2017-02-14 DIAGNOSIS — E2839 Other primary ovarian failure: Secondary | ICD-10-CM

## 2017-02-14 LAB — LIPID PANEL
CHOLESTEROL: 172 mg/dL (ref 0–200)
HDL: 59.1 mg/dL (ref >39.00)
LDL Cholesterol: 76 mg/dL (ref 0–99)
NonHDL: 112.54
TRIGLYCERIDES: 183 mg/dL — AB (ref 0.0–149.0)
Total CHOL/HDL Ratio: 3
VLDL: 36.6 mg/dL (ref 0.0–40.0)

## 2017-02-14 LAB — HEPATIC FUNCTION PANEL
ALBUMIN: 4.1 g/dL (ref 3.5–5.2)
ALT: 13 U/L (ref 0–35)
AST: 14 U/L (ref 0–37)
Alkaline Phosphatase: 59 U/L (ref 39–117)
Bilirubin, Direct: 0.1 mg/dL (ref 0.0–0.3)
TOTAL PROTEIN: 6 g/dL (ref 6.0–8.3)
Total Bilirubin: 0.6 mg/dL (ref 0.2–1.2)

## 2017-02-14 LAB — CBC WITH DIFFERENTIAL/PLATELET
BASOS ABS: 0 10*3/uL (ref 0.0–0.1)
Basophils Relative: 0.8 % (ref 0.0–3.0)
Eosinophils Absolute: 0.1 10*3/uL (ref 0.0–0.7)
Eosinophils Relative: 1.4 % (ref 0.0–5.0)
HCT: 44.1 % (ref 36.0–46.0)
Hemoglobin: 14.7 g/dL (ref 12.0–15.0)
LYMPHS ABS: 1.2 10*3/uL (ref 0.7–4.0)
Lymphocytes Relative: 22.6 % (ref 12.0–46.0)
MCHC: 33.4 g/dL (ref 30.0–36.0)
MCV: 96.1 fl (ref 78.0–100.0)
MONOS PCT: 7.9 % (ref 3.0–12.0)
Monocytes Absolute: 0.4 10*3/uL (ref 0.1–1.0)
NEUTROS ABS: 3.5 10*3/uL (ref 1.4–7.7)
NEUTROS PCT: 67.3 % (ref 43.0–77.0)
PLATELETS: 174 10*3/uL (ref 150.0–400.0)
RBC: 4.58 Mil/uL (ref 3.87–5.11)
RDW: 12.7 % (ref 11.5–15.5)
WBC: 5.1 10*3/uL (ref 4.0–10.5)

## 2017-02-14 LAB — BASIC METABOLIC PANEL
BUN: 16 mg/dL (ref 6–23)
CALCIUM: 9.2 mg/dL (ref 8.4–10.5)
CHLORIDE: 105 meq/L (ref 96–112)
CO2: 26 meq/L (ref 19–32)
CREATININE: 0.85 mg/dL (ref 0.40–1.20)
GFR: 70.11 mL/min (ref >60.00)
GLUCOSE: 111 mg/dL — AB (ref 70–99)
Potassium: 4.2 mEq/L (ref 3.5–5.1)
Sodium: 140 mEq/L (ref 135–145)

## 2017-02-14 LAB — TSH: TSH: 5.2 u[IU]/mL — ABNORMAL HIGH (ref 0.35–4.50)

## 2017-02-14 LAB — VITAMIN D 25 HYDROXY (VIT D DEFICIENCY, FRACTURES): VITD: 16.39 ng/mL — ABNORMAL LOW (ref 30.00–100.00)

## 2017-02-14 MED ORDER — OXYBUTYNIN CHLORIDE 5 MG PO TABS: 5.0000 mg | ORAL_TABLET | Freq: Two times a day (BID) | ORAL | 1 refills | Status: DC

## 2017-02-14 MED ORDER — OMEPRAZOLE 40 MG PO CPDR: 40.0000 mg | DELAYED_RELEASE_CAPSULE | Freq: Every day | ORAL | 1 refills | Status: DC

## 2017-02-14 MED ORDER — HYDROCODONE-ACETAMINOPHEN 5-325 MG PO TABS
1.0000 | ORAL_TABLET | Freq: Four times a day (QID) | ORAL | 0 refills | Status: DC | PRN
Start: 2017-02-14 — End: 2017-10-08

## 2017-02-14 NOTE — Assessment & Plan Note (Signed)
Pt's PE WNL w/ exception of obesity.  Due for mammo and DEXA- orders placed.  Due for recall colonoscopy later this year- pt aware.  UTD on immunizations.  Pt had Wellness visit earlier this year.  Check labs.  Anticipatory guidance provided.

## 2017-02-14 NOTE — Assessment & Plan Note (Signed)
Chronic problem.  Asymptomatic at this time.  Check labs.  Adjust meds prn. 

## 2017-02-14 NOTE — Assessment & Plan Note (Signed)
Pt continues to gain weight.  BMI is now >40.  Stressed need for healthy diet and regular exercise.  Check labs to risk stratify.

## 2017-02-14 NOTE — Assessment & Plan Note (Signed)
Pt has hx of this.  Check labs.  Replete prn. 

## 2017-02-14 NOTE — Progress Notes (Signed)
   Subjective:    Patient ID: Ashley Woods, female    DOB: [birth year], 71 y.o.   MRN: [id number]  HPI CPE- due for mammo and DEXA, due for recall colonoscopy later this year w/ Dr Henrene Pastor.  No need for pap due to hysterectomy.  UTD on immunizations.  She had wellness visit earlier this year.     Review of Systems Patient reports no vision/ hearing changes, adenopathy,fever, weight change,  persistant/recurrent hoarseness , swallowing issues, chest pain, palpitations, edema, persistant/recurrent cough, hemoptysis, dyspnea (rest/exertional/paroxysmal nocturnal), gastrointestinal bleeding (melena, rectal bleeding), abdominal pain, significant heartburn, bowel changes, GU symptoms (dysuria, hematuria, incontinence), Gyn symptoms (abnormal  bleeding, pain),  syncope, focal weakness, memory loss, skin/hair/nail changes, abnormal bruising or bleeding, anxiety, or depression.   + bilateral neuropathy of legs    Objective:   Physical Exam General Appearance:    Alert, cooperative, no distress, appears stated age, obese  Head:    Normocephalic, without obvious abnormality, atraumatic  Eyes:    PERRL, conjunctiva/corneas clear, EOM's intact, fundi    benign, both eyes  Ears:    Normal TM's and external ear canals, both ears  Nose:   Nares normal, septum midline, mucosa normal, no drainage    or sinus tenderness  Throat:   Lips, mucosa, and tongue normal; teeth and gums normal  Neck:   Supple, symmetrical, trachea midline, no adenopathy;    Thyroid: no enlargement/tenderness/nodules  Back:     Symmetric, no curvature, ROM normal, no CVA tenderness  Lungs:     Clear to auscultation bilaterally, respirations unlabored  Chest Wall:    No tenderness or deformity   Heart:    Regular rate and rhythm, S1 and S2 normal, no murmur, rub   or gallop  Breast Exam:    Deferred to mammo  Abdomen:     Soft, non-tender, bowel sounds active all four quadrants,    no masses, no organomegaly  Genitalia:     Deferred  Rectal:    Extremities:   Extremities normal, atraumatic, no cyanosis or edema  Pulses:   2+ and symmetric all extremities  Skin:   Skin color, texture, turgor normal, no rashes or lesions  Lymph nodes:   Cervical, supraclavicular, and axillary nodes normal  Neurologic:   CNII-XII intact, normal strength, sensation and reflexes    throughout          Assessment & Plan:

## 2017-02-14 NOTE — Assessment & Plan Note (Signed)
Chronic problem.  Tolerating statin w/o difficulty.  Check labs.  Adjust meds prn  

## 2017-02-14 NOTE — Progress Notes (Signed)
Pre visit review using our clinic review tool, if applicable. No additional management support is needed unless otherwise documented below in the visit note. 

## 2017-02-14 NOTE — Assessment & Plan Note (Signed)
Chronic problem.  Adequate control today.  Asymptomatic.  Check labs.  No anticipated med changes. 

## 2017-02-14 NOTE — Patient Instructions (Signed)
Follow up in 6 months to recheck BP and cholesterol We'll notify you of your lab results and make any changes if needed Continue to work on healthy diet and regular exercise- you can do it! Please schedule your mammo and bone density appt at [facility]- the order is in! You are due for colonoscopy w/ Dr Henrene Pastor later this year- they should contact you Start daily Claritin or Zyrtec to help w/ the allergy congestion Call with any questions or concerns Have a great summer!!!

## 2017-02-15 ENCOUNTER — Other Ambulatory Visit (INDEPENDENT_AMBULATORY_CARE_PROVIDER_SITE_OTHER): Payer: Medicare HMO

## 2017-02-15 DIAGNOSIS — R7309 Other abnormal glucose: Secondary | ICD-10-CM | POA: Diagnosis not present

## 2017-02-15 LAB — HEMOGLOBIN A1C: Hgb A1c MFr Bld: 5.9 % (ref 4.6–6.5)

## 2017-02-16 DIAGNOSIS — M79641 Pain in right hand: Secondary | ICD-10-CM | POA: Diagnosis not present

## 2017-02-16 DIAGNOSIS — S62346D Nondisplaced fracture of base of fifth metacarpal bone, right hand, subsequent encounter for fracture with routine healing: Secondary | ICD-10-CM | POA: Diagnosis not present

## 2017-02-19 ENCOUNTER — Other Ambulatory Visit: Payer: Self-pay | Admitting: General Practice

## 2017-02-19 DIAGNOSIS — E038 Other specified hypothyroidism: Secondary | ICD-10-CM

## 2017-02-19 MED ORDER — VITAMIN D (ERGOCALCIFEROL) 1.25 MG (50000 UNIT) PO CAPS: 50000.0000 [IU] | ORAL_CAPSULE | ORAL | 0 refills | Status: DC

## 2017-02-19 MED ORDER — LEVOTHYROXINE SODIUM 100 MCG PO TABS: 100.0000 ug | ORAL_TABLET | Freq: Every day | ORAL | 3 refills | Status: DC

## 2017-03-07 ENCOUNTER — Ambulatory Visit (INDEPENDENT_AMBULATORY_CARE_PROVIDER_SITE_OTHER): Payer: Medicare HMO

## 2017-03-07 DIAGNOSIS — M8589 Other specified disorders of bone density and structure, multiple sites: Secondary | ICD-10-CM | POA: Diagnosis not present

## 2017-03-07 DIAGNOSIS — M8588 Other specified disorders of bone density and structure, other site: Secondary | ICD-10-CM

## 2017-03-07 DIAGNOSIS — Z1231 Encounter for screening mammogram for malignant neoplasm of breast: Secondary | ICD-10-CM

## 2017-03-07 DIAGNOSIS — Z780 Asymptomatic menopausal state: Secondary | ICD-10-CM | POA: Diagnosis not present

## 2017-03-07 DIAGNOSIS — E2839 Other primary ovarian failure: Secondary | ICD-10-CM

## 2017-03-08 ENCOUNTER — Encounter: Payer: Self-pay | Admitting: General Practice

## 2017-04-18 ENCOUNTER — Other Ambulatory Visit: Payer: Self-pay | Admitting: Family Medicine

## 2017-06-06 ENCOUNTER — Other Ambulatory Visit: Payer: Self-pay | Admitting: Family Medicine

## 2017-06-29 ENCOUNTER — Encounter: Payer: Self-pay | Admitting: Internal Medicine

## 2017-07-27 DIAGNOSIS — G4733 Obstructive sleep apnea (adult) (pediatric): Secondary | ICD-10-CM | POA: Diagnosis not present

## 2017-07-28 ENCOUNTER — Other Ambulatory Visit: Payer: Self-pay | Admitting: Family Medicine

## 2017-08-07 ENCOUNTER — Encounter: Payer: Self-pay | Admitting: Internal Medicine

## 2017-08-13 ENCOUNTER — Other Ambulatory Visit: Payer: Self-pay | Admitting: Family Medicine

## 2017-09-13 ENCOUNTER — Ambulatory Visit (AMBULATORY_SURGERY_CENTER): Payer: Self-pay | Admitting: *Deleted

## 2017-09-13 ENCOUNTER — Encounter: Payer: Self-pay | Admitting: Internal Medicine

## 2017-09-13 ENCOUNTER — Other Ambulatory Visit: Payer: Self-pay

## 2017-09-13 VITALS — Ht 65.0 in | Wt 233.2 lb

## 2017-09-13 DIAGNOSIS — Z86010 Personal history of colonic polyps: Secondary | ICD-10-CM

## 2017-09-13 MED ORDER — NA SULFATE-K SULFATE-MG SULF 17.5-3.13-1.6 GM/177ML PO SOLN: 1.0000 | Freq: Once | ORAL | 0 refills | Status: AC

## 2017-09-13 NOTE — Progress Notes (Signed)
No egg or soy allergy known to patient  No issues with past sedation with any surgeries  or procedures, no intubation problems  No diet pills per patient No home 02 use per patient  No blood thinners per patient  Pt denies issues with constipation  No A fib or A flutter  EMMI video sent to pt's e mail  

## 2017-09-18 ENCOUNTER — Telehealth: Payer: Self-pay | Admitting: Internal Medicine

## 2017-09-18 NOTE — Telephone Encounter (Signed)
Patient canceled procedure for tomorrow 2.20.19 due to possible bad weather in her area and not wanting to drive in it due to age. Pt resch till 4.15.19.

## 2017-09-19 ENCOUNTER — Encounter: Payer: Medicare HMO | Admitting: Internal Medicine

## 2017-09-19 ENCOUNTER — Other Ambulatory Visit: Payer: Self-pay | Admitting: Family Medicine

## 2017-10-08 ENCOUNTER — Other Ambulatory Visit: Payer: Self-pay

## 2017-10-08 ENCOUNTER — Encounter: Payer: Self-pay | Admitting: Family Medicine

## 2017-10-08 ENCOUNTER — Ambulatory Visit (INDEPENDENT_AMBULATORY_CARE_PROVIDER_SITE_OTHER): Payer: Medicare HMO | Admitting: Family Medicine

## 2017-10-08 VITALS — BP 132/82 | HR 69 | Resp 17 | Ht 64.0 in | Wt 236.4 lb

## 2017-10-08 DIAGNOSIS — G8929 Other chronic pain: Secondary | ICD-10-CM

## 2017-10-08 DIAGNOSIS — Z23 Encounter for immunization: Secondary | ICD-10-CM

## 2017-10-08 DIAGNOSIS — G5793 Unspecified mononeuropathy of bilateral lower limbs: Secondary | ICD-10-CM | POA: Diagnosis not present

## 2017-10-08 DIAGNOSIS — I10 Essential (primary) hypertension: Secondary | ICD-10-CM | POA: Diagnosis not present

## 2017-10-08 DIAGNOSIS — E038 Other specified hypothyroidism: Secondary | ICD-10-CM | POA: Diagnosis not present

## 2017-10-08 DIAGNOSIS — I6529 Occlusion and stenosis of unspecified carotid artery: Secondary | ICD-10-CM

## 2017-10-08 DIAGNOSIS — M549 Dorsalgia, unspecified: Secondary | ICD-10-CM

## 2017-10-08 DIAGNOSIS — E785 Hyperlipidemia, unspecified: Secondary | ICD-10-CM | POA: Diagnosis not present

## 2017-10-08 LAB — LDL CHOLESTEROL, DIRECT: LDL DIRECT: 59 mg/dL

## 2017-10-08 LAB — LIPID PANEL
CHOLESTEROL: 154 mg/dL (ref 0–200)
HDL: 54.8 mg/dL (ref >39.00)
NonHDL: 99.63
TRIGLYCERIDES: 275 mg/dL — AB (ref 0.0–149.0)
Total CHOL/HDL Ratio: 3
VLDL: 55 mg/dL — ABNORMAL HIGH (ref 0.0–40.0)

## 2017-10-08 LAB — BASIC METABOLIC PANEL
BUN: 14 mg/dL (ref 6–23)
CALCIUM: 9.5 mg/dL (ref 8.4–10.5)
CHLORIDE: 106 meq/L (ref 96–112)
CO2: 31 mEq/L (ref 19–32)
CREATININE: 0.75 mg/dL (ref 0.40–1.20)
GFR: 80.85 mL/min (ref >60.00)
Glucose, Bld: 97 mg/dL (ref 70–99)
Potassium: 4.1 mEq/L (ref 3.5–5.1)
Sodium: 142 mEq/L (ref 135–145)

## 2017-10-08 LAB — CBC WITH DIFFERENTIAL/PLATELET
Basophils Absolute: 0.1 10*3/uL (ref 0.0–0.1)
Basophils Relative: 1 % (ref 0.0–3.0)
EOS PCT: 2.1 % (ref 0.0–5.0)
Eosinophils Absolute: 0.1 10*3/uL (ref 0.0–0.7)
HCT: 42.8 % (ref 36.0–46.0)
HEMOGLOBIN: 14.5 g/dL (ref 12.0–15.0)
Lymphocytes Relative: 27.8 % (ref 12.0–46.0)
Lymphs Abs: 1.5 10*3/uL (ref 0.7–4.0)
MCHC: 33.9 g/dL (ref 30.0–36.0)
MCV: 94.8 fl (ref 78.0–100.0)
MONOS PCT: 8.6 % (ref 3.0–12.0)
Monocytes Absolute: 0.5 10*3/uL (ref 0.1–1.0)
Neutro Abs: 3.3 10*3/uL (ref 1.4–7.7)
Neutrophils Relative %: 60.5 % (ref 43.0–77.0)
Platelets: 176 10*3/uL (ref 150.0–400.0)
RBC: 4.51 Mil/uL (ref 3.87–5.11)
RDW: 12.8 % (ref 11.5–15.5)
WBC: 5.4 10*3/uL (ref 4.0–10.5)

## 2017-10-08 LAB — HEPATIC FUNCTION PANEL
ALBUMIN: 4.2 g/dL (ref 3.5–5.2)
ALT: 16 U/L (ref 0–35)
AST: 14 U/L (ref 0–37)
Alkaline Phosphatase: 62 U/L (ref 39–117)
Bilirubin, Direct: 0.1 mg/dL (ref 0.0–0.3)
Total Bilirubin: 0.5 mg/dL (ref 0.2–1.2)
Total Protein: 6.2 g/dL (ref 6.0–8.3)

## 2017-10-08 MED ORDER — CLONAZEPAM 0.5 MG PO TABS: ORAL_TABLET | ORAL | 1 refills | Status: DC

## 2017-10-08 MED ORDER — HYDROCODONE-ACETAMINOPHEN 5-325 MG PO TABS: 1.0000 | ORAL_TABLET | Freq: Four times a day (QID) | ORAL | 0 refills | Status: DC | PRN

## 2017-10-08 MED ORDER — NYSTATIN-TRIAMCINOLONE 100000-0.1 UNIT/GM-% EX OINT: 1.0000 "application " | TOPICAL_OINTMENT | Freq: Two times a day (BID) | CUTANEOUS | 3 refills | Status: DC

## 2017-10-08 MED ORDER — GABAPENTIN 300 MG PO CAPS: 300.0000 mg | ORAL_CAPSULE | Freq: Every day | ORAL | 0 refills | Status: DC

## 2017-10-08 NOTE — Patient Instructions (Addendum)
Schedule your complete physical in 6 months and your Medicare Wellness Visit with [facility]ins notify you of your lab results and make any changes if needed Continue to work on healthy diet and regular exercise- you can do it! We'll call you with your appt for the carotid ultrasound START the Gabapentin nightly for neuropathy Call with any questions or concerns [place]!!

## 2017-10-08 NOTE — Progress Notes (Signed)
   Subjective:    Patient ID: Ashley Woods, female    DOB: 1946-04-05, 72 y.o.   MRN: [id number]  HPI HTN- chronic problem, on Lisinopril 20mg  daily, Lasix 40mg  PRN swelling.  No CP, SOB.  + HAs.  No visual changes, edema.  Hyperlipidemia- chronic problem, on Simvastatin 40mg  daily.  No abd pain, N/V.  Hypothyroid- chronic problem, on Levothyroxine 16mcg daily.  Denies excessive fatigue.  Morbid obesity- chronic problem.  BMI is now 40.6  No regular exercise and not following a particular diet  Carotid stenosis- due for repeat US  Bilateral neuropathy of feet- pt is asking for any possible treatment due to pain.  Hx of B12 deficiency.  sxs are worse in the evening and not as noticeable during the day.   Review of Systems For ROS see HPI     Objective:   Physical Exam  Constitutional: She is oriented to person, place, and time. She appears well-developed and well-nourished. No distress.  obese  HENT:  Head: Normocephalic and atraumatic.  Eyes: Conjunctivae and EOM are normal. Pupils are equal, round, and reactive to light.  Neck: Normal range of motion. Neck supple. No thyromegaly present.  Cardiovascular: Normal rate, regular rhythm, normal heart sounds and intact distal pulses.  No murmur heard. Pulmonary/Chest: Effort normal and breath sounds normal. No respiratory distress.  Abdominal: Soft. She exhibits no distension. There is no tenderness.  Musculoskeletal: She exhibits no edema.  Lymphadenopathy:    She has no cervical adenopathy.  Neurological: She is alert and oriented to person, place, and time.  Skin: Skin is warm and dry.  Psychiatric: She has a normal mood and affect. Her behavior is normal.  Vitals reviewed.         Assessment & Plan:

## 2017-10-09 ENCOUNTER — Telehealth: Payer: Self-pay | Admitting: Family Medicine

## 2017-10-09 ENCOUNTER — Other Ambulatory Visit: Payer: Self-pay | Admitting: Family Medicine

## 2017-10-09 DIAGNOSIS — M549 Dorsalgia, unspecified: Secondary | ICD-10-CM

## 2017-10-09 DIAGNOSIS — G8929 Other chronic pain: Secondary | ICD-10-CM | POA: Insufficient documentation

## 2017-10-09 DIAGNOSIS — I6529 Occlusion and stenosis of unspecified carotid artery: Secondary | ICD-10-CM

## 2017-10-09 LAB — PAIN MGMT, PROFILE 8 W/CONF, U
6 Acetylmorphine: NEGATIVE ng/mL (ref <10)
ALCOHOL METABOLITES: NEGATIVE ng/mL (ref <500)
Amphetamines: NEGATIVE ng/mL (ref <500)
Benzodiazepines: NEGATIVE ng/mL (ref <100)
Buprenorphine, Urine: NEGATIVE ng/mL (ref <5)
COCAINE METABOLITE: NEGATIVE ng/mL (ref <150)
CREATININE: 59 mg/dL
MDMA: NEGATIVE ng/mL (ref <500)
Marijuana Metabolite: NEGATIVE ng/mL (ref <20)
OXIDANT: NEGATIVE ug/mL (ref <200)
OXYCODONE: NEGATIVE ng/mL (ref <100)
Opiates: NEGATIVE ng/mL (ref <100)
PH: 6.9 (ref 4.5–9.0)

## 2017-10-09 LAB — TSH: TSH: 1.83 u[IU]/mL (ref 0.35–4.50)

## 2017-10-09 LAB — B12 AND FOLATE PANEL
Folate: 15.3 ng/mL (ref >5.9)
Vitamin B-12: 169 pg/mL — ABNORMAL LOW (ref 211–911)

## 2017-10-09 NOTE — Assessment & Plan Note (Signed)
Ongoing issue.  Pt is asking for refill on Hydrocodone.  Reviewed [place] Controlled Substance Database- no red flags.  Due for UDS- urine collected today.  Refill provided.

## 2017-10-09 NOTE — Telephone Encounter (Signed)
Please advise for the B12 injections. Should I send in a 37mL vial or 10 76mL vials?

## 2017-10-09 NOTE — Assessment & Plan Note (Signed)
New to provider, ongoing for pt.  She has hx of B12 deficiency which could be contributing.  Check B12 and replete prn.  Add Gabapentin nightly for sx relief

## 2017-10-09 NOTE — Assessment & Plan Note (Signed)
Chronic problem.  Tolerating statin w/o difficulty.  Stressed need for healthy diet and regular exercise.  Check labs.  Adjust meds prn  

## 2017-10-09 NOTE — Assessment & Plan Note (Signed)
Ongoing issue for pt.  BMI >40.  She is not exercising nor following a particular diet.  Stressed need for both.  Check labs to risk stratify and we will continue to follow.

## 2017-10-09 NOTE — Assessment & Plan Note (Signed)
Chronic problem.  Adequate control and asymptomatic w/ exception of HAs (which pt feels are sinus related).  Encouraged healthy diet and regular exercise.  Check labs.  No anticipated med changes.  Will follow.

## 2017-10-09 NOTE — Assessment & Plan Note (Signed)
Pt has hx of this, was following w/ Dr Stanford Breed but has not been evaluated recently.  Repeat carotid dopplers.  Will follow.

## 2017-10-09 NOTE — Telephone Encounter (Signed)
Copied from [place] [phone]. Topic: Quick Communication - See Telephone Encounter >> Oct 09, 2017 12:49 PM Lolita Rieger, RMA wrote: CRM for notification. See Telephone encounter for:   10/09/17.pt called and stated that she has someone to give her her vit d shots and would like a rx sent [facility] on  [address]

## 2017-10-09 NOTE — Assessment & Plan Note (Signed)
Ongoing issue for pt.  Currently asymptomatic.  Check labs.  Adjust meds prn. 

## 2017-10-10 MED ORDER — "SYRINGE/NEEDLE (DISP) 25G X 1"" 3 ML MISC": 0 refills | Status: DC

## 2017-10-10 MED ORDER — CYANOCOBALAMIN 1000 MCG/ML IJ SOLN: INTRAMUSCULAR | 10 refills | Status: DC

## 2017-10-10 NOTE — Telephone Encounter (Signed)
Please do the 1 ml vials to avoid confusion.  Thanks!

## 2017-10-10 NOTE — Telephone Encounter (Signed)
Called and informed pt, med filled to pharmacy.

## 2017-11-05 ENCOUNTER — Encounter (HOSPITAL_BASED_OUTPATIENT_CLINIC_OR_DEPARTMENT_OTHER): Payer: Self-pay | Admitting: Radiology

## 2017-11-05 ENCOUNTER — Ambulatory Visit (HOSPITAL_BASED_OUTPATIENT_CLINIC_OR_DEPARTMENT_OTHER)
Admission: RE | Admit: 2017-11-05 | Discharge: 2017-11-05 | Disposition: A | Discharge status: Home / Self Care | Payer: Medicare HMO | Source: Ambulatory Visit | Attending: Family Medicine | Admitting: Family Medicine

## 2017-11-05 DIAGNOSIS — I6523 Occlusion and stenosis of bilateral carotid arteries: Secondary | ICD-10-CM | POA: Diagnosis not present

## 2017-11-05 DIAGNOSIS — I6529 Occlusion and stenosis of unspecified carotid artery: Secondary | ICD-10-CM

## 2017-11-06 ENCOUNTER — Other Ambulatory Visit: Payer: Self-pay | Admitting: Emergency Medicine

## 2017-11-06 DIAGNOSIS — I6523 Occlusion and stenosis of bilateral carotid arteries: Secondary | ICD-10-CM

## 2017-11-06 NOTE — Progress Notes (Signed)
a 

## 2017-11-12 ENCOUNTER — Encounter: Payer: Medicare HMO | Admitting: Internal Medicine

## 2017-11-22 DIAGNOSIS — I6523 Occlusion and stenosis of bilateral carotid arteries: Secondary | ICD-10-CM | POA: Diagnosis not present

## 2017-11-26 ENCOUNTER — Other Ambulatory Visit: Payer: Self-pay | Admitting: Family Medicine

## 2017-12-12 ENCOUNTER — Other Ambulatory Visit: Payer: Self-pay | Admitting: Family Medicine

## 2017-12-16 DIAGNOSIS — Z882 Allergy status to sulfonamides status: Secondary | ICD-10-CM | POA: Diagnosis not present

## 2017-12-16 DIAGNOSIS — Z885 Allergy status to narcotic agent status: Secondary | ICD-10-CM | POA: Diagnosis not present

## 2017-12-16 DIAGNOSIS — Z9989 Dependence on other enabling machines and devices: Secondary | ICD-10-CM | POA: Diagnosis not present

## 2017-12-16 DIAGNOSIS — Z8673 Personal history of transient ischemic attack (TIA), and cerebral infarction without residual deficits: Secondary | ICD-10-CM | POA: Diagnosis not present

## 2017-12-16 DIAGNOSIS — H9203 Otalgia, bilateral: Secondary | ICD-10-CM | POA: Diagnosis not present

## 2017-12-16 DIAGNOSIS — Z888 Allergy status to other drugs, medicaments and biological substances status: Secondary | ICD-10-CM | POA: Diagnosis not present

## 2017-12-16 DIAGNOSIS — H60503 Unspecified acute noninfective otitis externa, bilateral: Secondary | ICD-10-CM | POA: Diagnosis not present

## 2017-12-16 DIAGNOSIS — K219 Gastro-esophageal reflux disease without esophagitis: Secondary | ICD-10-CM | POA: Diagnosis not present

## 2017-12-16 DIAGNOSIS — Z886 Allergy status to analgesic agent status: Secondary | ICD-10-CM | POA: Diagnosis not present

## 2017-12-16 DIAGNOSIS — G4730 Sleep apnea, unspecified: Secondary | ICD-10-CM | POA: Diagnosis not present

## 2017-12-19 ENCOUNTER — Other Ambulatory Visit: Payer: Self-pay | Admitting: Family Medicine

## 2017-12-25 ENCOUNTER — Other Ambulatory Visit: Payer: Self-pay | Admitting: Family Medicine

## 2017-12-27 ENCOUNTER — Encounter: Payer: Self-pay | Admitting: Family Medicine

## 2017-12-27 ENCOUNTER — Ambulatory Visit (INDEPENDENT_AMBULATORY_CARE_PROVIDER_SITE_OTHER): Payer: Medicare HMO | Admitting: Family Medicine

## 2017-12-27 ENCOUNTER — Other Ambulatory Visit: Payer: Self-pay

## 2017-12-27 VITALS — BP 136/78 | HR 62 | Temp 98.6°F | Resp 16 | Ht 64.0 in | Wt 232.6 lb

## 2017-12-27 DIAGNOSIS — H60503 Unspecified acute noninfective otitis externa, bilateral: Secondary | ICD-10-CM

## 2017-12-27 DIAGNOSIS — G8929 Other chronic pain: Secondary | ICD-10-CM

## 2017-12-27 DIAGNOSIS — M549 Dorsalgia, unspecified: Secondary | ICD-10-CM

## 2017-12-27 DIAGNOSIS — H60313 Diffuse otitis externa, bilateral: Secondary | ICD-10-CM | POA: Diagnosis not present

## 2017-12-27 MED ORDER — HYDROCODONE-ACETAMINOPHEN 5-325 MG PO TABS
1.0000 | ORAL_TABLET | Freq: Four times a day (QID) | ORAL | 0 refills | Status: DC | PRN
Start: 2017-12-27 — End: 2018-04-24

## 2017-12-27 NOTE — Progress Notes (Signed)
   Subjective:    Patient ID: Ashley Woods, female    DOB: 05-13-1946, 72 y.o.   MRN: [id number]  HPI Ear pain- pt had bilateral ear infxns, was seen at [facility] ER on 5/19 and prescribed Augmentin and steroid ear drops (Cortisporin).  Continues to have popping, pressure.  No drainage from ears- 'feels like there's water in there'.  No fevers.  + maxillary sinus pressure.  Not currently on any allergy medication.  No known sick contacts.  Pt wears hearing aides but has not been able to use due to pain.   Review of Systems For ROS see HPI     Objective:   Physical Exam  Constitutional: She is oriented to person, place, and time. She appears well-developed and well-nourished. No distress.  HENT:  Head: Normocephalic and atraumatic.  Mouth/Throat: Oropharynx is clear and moist.  EACs are erythematous, mildly swollen, very tender to manipulation/palpation.  + debris in EACs bilaterally obscuring full view of TMs bilaterally.  Neurological: She is alert and oriented to person, place, and time.  Decreased hearing  Vitals reviewed.         Assessment & Plan:  Otitis externa- ongoing.  No relief w/ Cortisporin ear drops which makes me wonder about fungal otitis.  Given that I can't fully visualize each TM, will avoid switching to Vosol which has acetic acid in it.  Will refer to ENT for ear cleanout.  Pt expressed understanding and is in agreement w/ plan.

## 2017-12-27 NOTE — Patient Instructions (Signed)
Follow up as needed or as scheduled We'll call you with your ENT appt Continue the Cortisporin ear drops I don't want to change your ear drops b/c I can't fully visualize your ear drums and IF there's a perforation, we have to be careful what drops we can use Try and avoid wearing your hearing aides when possible Call with any questions or concerns Hang in there!!!

## 2018-01-02 ENCOUNTER — Encounter: Payer: Medicare HMO | Admitting: Internal Medicine

## 2018-01-02 DIAGNOSIS — Z888 Allergy status to other drugs, medicaments and biological substances status: Secondary | ICD-10-CM | POA: Diagnosis not present

## 2018-01-02 DIAGNOSIS — H60313 Diffuse otitis externa, bilateral: Secondary | ICD-10-CM | POA: Diagnosis not present

## 2018-01-02 DIAGNOSIS — Z882 Allergy status to sulfonamides status: Secondary | ICD-10-CM | POA: Diagnosis not present

## 2018-01-02 DIAGNOSIS — Z886 Allergy status to analgesic agent status: Secondary | ICD-10-CM | POA: Diagnosis not present

## 2018-01-25 DIAGNOSIS — G4733 Obstructive sleep apnea (adult) (pediatric): Secondary | ICD-10-CM | POA: Diagnosis not present

## 2018-01-28 ENCOUNTER — Other Ambulatory Visit: Payer: Self-pay | Admitting: Family Medicine

## 2018-02-04 ENCOUNTER — Other Ambulatory Visit: Payer: Self-pay | Admitting: Family Medicine

## 2018-03-20 ENCOUNTER — Other Ambulatory Visit: Payer: Self-pay | Admitting: Family Medicine

## 2018-04-01 ENCOUNTER — Other Ambulatory Visit: Payer: Self-pay | Admitting: Family Medicine

## 2018-04-15 DIAGNOSIS — I10 Essential (primary) hypertension: Secondary | ICD-10-CM | POA: Diagnosis not present

## 2018-04-15 DIAGNOSIS — Z9989 Dependence on other enabling machines and devices: Secondary | ICD-10-CM | POA: Diagnosis not present

## 2018-04-15 DIAGNOSIS — J45909 Unspecified asthma, uncomplicated: Secondary | ICD-10-CM | POA: Diagnosis not present

## 2018-04-15 DIAGNOSIS — G478 Other sleep disorders: Secondary | ICD-10-CM | POA: Diagnosis not present

## 2018-04-15 DIAGNOSIS — Z6838 Body mass index (BMI) 38.0-38.9, adult: Secondary | ICD-10-CM | POA: Diagnosis not present

## 2018-04-15 DIAGNOSIS — E669 Obesity, unspecified: Secondary | ICD-10-CM | POA: Diagnosis not present

## 2018-04-15 DIAGNOSIS — G4733 Obstructive sleep apnea (adult) (pediatric): Secondary | ICD-10-CM | POA: Diagnosis not present

## 2018-04-17 ENCOUNTER — Other Ambulatory Visit: Payer: Self-pay | Admitting: Family Medicine

## 2018-04-17 DIAGNOSIS — Z1231 Encounter for screening mammogram for malignant neoplasm of breast: Secondary | ICD-10-CM

## 2018-04-23 NOTE — Progress Notes (Addendum)
Subjective:   Ashley Woods is a 72 y.o. female who presents for Medicare Annual (Subsequent) preventive examination.  Review of Systems:  No ROS.  Medicare Wellness Visit. Additional risk factors are reflected in the social history.  Cardiac Risk Factors include: advanced age (>65men, >2 women);dyslipidemia;hypertension;obesity (BMI >30kg/m2);family history of premature cardiovascular disease   Sleep patterns: Sleeps 8-10 hours. Uses CPAP, sleep study scheduled for 04/28/18 Home Safety/Smoke Alarms: Feels safe in home. Smoke alarms in place.  Living environment; residence and Firearm Safety: Lives with husband and daughter in 1 story home, rail at steps. Seat Belt Safety/Bike Helmet: Wears seat belt.   Female:   Pap-N/A       Mammo-03/07/2017, BI-RADS CATEGORY  1: Negative. Scheduled 05/08/18.         Dexa scan-03/07/2017, Osteopenia.     CCS-Colonoscopy 05/30/2012, polyps. Recall 5 years. Will make appt.      Objective:     Vitals: BP (!) 170/98 (BP Location: Left Arm, Patient Position: Sitting, Cuff Size: Normal)   Pulse 68   Temp 98.1 F (36.7 C) (Temporal)   Resp 16   Ht 5\' 4"  (1.626 m)   Wt 234 lb (106.1 kg)   SpO2 98%   BMI 40.17 kg/m   Body mass index is 40.17 kg/m.  Advanced Directives 04/24/2018 09/13/2017 10/12/2016  Does Patient Have a Medical Advance Directive? Yes Yes No  Type of Advance Directive Living will;Healthcare Power of Attorney Living will -  Copy of [place] in Chart? No - copy requested - -  Would patient like information on creating a medical advance directive? - - No - Patient declined    Tobacco Social History   Tobacco Use  Smoking Status Former Smoker  . Packs/day: 0.40  . Years: 45.00  . Pack years: 18.00  . Types: E-cigarettes, Cigarettes  . Last attempt to quit: 04/30/2013  . Years since quitting: 4.9  Smokeless Tobacco Never Used  Tobacco Comment   former 1ppd x45 years, 09-13-17 report vaping       Counseling given: Not Answered Comment: former 1ppd x45 years, 09-13-17 report vaping    Past Medical History:  Diagnosis Date  . Asthma   . Cataract   . Colon polyp    adenomatous  . Colon polyps   . COPD (chronic obstructive pulmonary disease) ([place])   . Depression   . Diverticulosis   . GERD (gastroesophageal reflux disease)   . Hemorrhoids   . Hyperlipidemia   . Hypertension   . IBS (irritable bowel syndrome)   . Macular degeneration    Dr. Hoyle Sauer  . Overactive bladder   . Right arm fracture    2018  . Rosacea   . Sleep apnea    uses cpap  . Stroke [facility])    TIA     . Thyroid disease    Past Surgical History:  Procedure Laterality Date  . BLADDER SURGERY      about 5 years ago  . CATARACT EXTRACTION, BILATERAL  2016  . CERVICAL FUSION    . HERNIA REPAIR    . OOPHORECTOMY    . PARTIAL HYSTERECTOMY    . SHOULDER SURGERY    . TONSILLECTOMY     Family History  Problem Relation Age of Onset  . Heart disease Daughter   . Leonard White syndrome Daughter   . Colon cancer Neg Hx   . Inflammatory bowel disease Neg Hx   . Esophageal cancer Neg Hx   .  Stomach cancer Neg Hx   . Rectal cancer Neg Hx    Social History   Socioeconomic History  . Marital status: Married    Spouse name: Not on file  . Number of children: 2  . Years of education: Not on file  . Highest education level: Not on file  Occupational History  . Occupation: currently on disability  Social Needs  . Financial resource strain: Not on file  . Food insecurity:    Worry: Not on file    Inability: Not on file  . Transportation needs:    Medical: Not on file    Non-medical: Not on file  Tobacco Use  . Smoking status: Former Smoker    Packs/day: 0.40    Years: 45.00    Pack years: 18.00    Types: E-cigarettes, Cigarettes    Last attempt to quit: 04/30/2013    Years since quitting: 4.9  . Smokeless tobacco: Never Used  . Tobacco comment: former 1ppd x45 years, 09-13-17 report  vaping  Substance and Sexual Activity  . Alcohol use: No  . Drug use: No  . Sexual activity: Not on file  Lifestyle  . Physical activity:    Days per week: Not on file    Minutes per session: Not on file  . Stress: Not on file  Relationships  . Social connections:    Talks on phone: Not on file    Gets together: Not on file    Attends religious service: Not on file    Active member of club or organization: Not on file    Attends meetings of clubs or organizations: Not on file    Relationship status: Not on file  Other Topics Concern  . Not on file  Social History Narrative   Husband Jenny Reichmann on disability.    Former housekeeper   1 year of college    Outpatient Encounter Medications as of 04/24/2018  Medication Sig  . albuterol (PROVENTIL HFA;VENTOLIN HFA) 108 (90 Base) MCG/ACT inhaler Inhale into the lungs.  Marland Kitchen aspirin 81 MG tablet   . B Complex Vitamins (VITAMIN B COMPLEX 100) INJ Inject as directed.  . calcium-vitamin D (OSCAL 500/200 D-3) 500-200 MG-UNIT tablet Take by mouth.  . citalopram (CELEXA) 40 MG tablet TAKE 1 TABLET EVERY DAY  . clonazePAM (KLONOPIN) 0.5 MG tablet TAKE ONE TABLET BY MOUTH AT BEDTIME AS NEEDED  . conjugated estrogens (PREMARIN) vaginal cream Insert pea sized amount per vagina nightly x 3 weeks, then every other night.  . Diphenhyd-Hydrocort-Nystatin (FIRST-DUKES MOUTHWASH) SUSP Use as directed in the mouth or throat 3 (three) times a day. SWISH IN MOUTH  . furosemide (LASIX) 20 MG tablet Take 2 tablets (40 mg total) by mouth daily. (Patient taking differently: Take 40 mg by mouth daily. )  . gabapentin (NEURONTIN) 300 MG capsule TAKE 1 CAPSULE BY MOUTH AT BEDTIME  . HYDROcodone-acetaminophen (NORCO/VICODIN) 5-325 MG tablet Take 1 tablet by mouth every 6 (six) hours as needed.  Marland Kitchen levothyroxine (SYNTHROID, LEVOTHROID) 100 MCG tablet TAKE 1 TABLET BY MOUTH ONCE DAILY  . lisinopril (PRINIVIL,ZESTRIL) 20 MG tablet TAKE 1 TABLET EVERY DAY  . loperamide  (IMODIUM A-D) 2 MG tablet Take 2 mg by mouth.  . nystatin-triamcinolone ointment (MYCOLOG) Apply 1 application topically 2 (two) times daily.  Marland Kitchen omeprazole (PRILOSEC) 40 MG capsule Take 1 capsule (40 mg total) by mouth daily.  Marland Kitchen oxybutynin (DITROPAN) 5 MG tablet TAKE 1 TABLET BY MOUTH TWICE DAILY  . RESTASIS 0.05 % ophthalmic  emulsion Place 1 drop into both eyes 2 (two) times daily.   . simvastatin (ZOCOR) 40 MG tablet TAKE 1 TABLET AT BEDTIME  . cyanocobalamin (,VITAMIN B-12,) 1000 MCG/ML injection Please inject 73mL into the muscle once a week for 4 weeks, then once a month for 6 months. (Patient not taking: Reported on 04/24/2018)  . SYRINGE-NEEDLE, DISP, 3 ML (BD SAFETYGLIDE SYRINGE/NEEDLE) 25G X 1" 3 ML MISC Please use one syringe and needle for each injection.   No facility-administered encounter medications on file as of 04/24/2018.     Activities of Daily Living In your present state of health, do you have any difficulty performing the following activities: 04/24/2018 10/08/2017  Hearing? N N  Vision? N N  Difficulty concentrating or making decisions? Y N  Walking or climbing stairs? N N  Dressing or bathing? N N  Doing errands, shopping? N N  Preparing Food and eating ? N -  Using the Toilet? N -  In the past six months, have you accidently leaked urine? N -  Do you have problems with loss of bowel control? N -  Managing your Medications? N -  Managing your Finances? N -  Housekeeping or managing your Housekeeping? N -  Some recent data might be hidden    Patient Care Team: Midge Minium, MD as PCP - General Irene Shipper, MD as Consulting Physician (Gastroenterology) Renelda Loma, OD as Consulting Physician (Optometry) Rockey Situ (Obstetrics and Gynecology) Franchot Mimes Teodora Medici, MD as Referring Physician (Pulmonary Disease)    Assessment:   This is a routine wellness examination for [facility]  Exercise Activities and Dietary recommendations Current  Exercise Habits: Home exercise routine, Type of exercise: walking, Time (Minutes): 30, Frequency (Times/Week): 4, Weekly Exercise (Minutes/Week): 120, Exercise limited by: None identified   Diet (meal preparation, eat out, water intake, caffeinated beverages, dairy products, fruits and vegetables): Drinks water, gatorade and coffee. Soda with dinner.   Breakfast: breakfast bar, coffee Lunch: sandwich Dinner: protein, vegetables, and starch.   Goals    . Weight (lb) < 180 lb (81.6 kg)     Would like to lose weight, plans to cut back on portion. Increase activity by starting to go to [facility].        Fall Risk Fall Risk  04/24/2018 10/08/2017 02/14/2017 10/12/2016 09/17/2015  Falls in the past year? No Yes Yes Yes No  Comment - - in june tripped over a little girl, broke her hand legs would "give out" -  Number falls in past yr: - 1 1 2  or more -  Injury with Fall? - Yes Yes No -  Follow up - - - Falls prevention discussed -   Depression Screen PHQ 2/9 Scores 04/24/2018 10/08/2017 02/14/2017 10/12/2016  PHQ - 2 Score 1 0 0 0  PHQ- 9 Score 7 0 0 -  Exception Documentation - - - -     Cognitive Function MMSE - Mini Mental State Exam 04/24/2018 10/12/2016  Orientation to time 5 5  Orientation to Place 5 5  Registration 3 3  Attention/ Calculation 5 5  Recall 2 2  Language- name 2 objects 2 2  Language- repeat 1 1  Language- follow 3 step command 3 3  Language- read & follow direction 1 1  Write a sentence 1 1  Copy design 1 1  Total score 29 29        Immunization History  Administered Date(s) Administered  . Influenza Split 05/08/2012  . Influenza  Whole 06/18/2008  . Influenza, High Dose Seasonal PF 04/24/2018  . Influenza,inj,Quad PF,6+ Mos 04/28/2015, 08/22/2016, 10/08/2017  . Pneumococcal Conjugate-13 04/28/2015  . Pneumococcal Polysaccharide-23 05/08/2012    Screening Tests Health Maintenance  Topic Date Due  . COLONOSCOPY  05/30/2017  . INFLUENZA VACCINE  02/28/2018    . MAMMOGRAM  03/07/2018  . TETANUS/TDAP  04/25/2019 (Originally 04/18/1965)  . Hepatitis C Screening  04/25/2019 (Originally 10-07-1945)  . DEXA SCAN  Completed  . PNA vac Low Risk Adult  Completed       Plan:    Schedule colonoscopy.   Continue doing brain stimulating activities (puzzles, reading, adult coloring books, staying active) to keep memory sharp.   Bring a copy of your living will and/or healthcare power of attorney to your next office visit.  I have personally reviewed and noted the following in the patient's chart:   . Medical and social history . Use of alcohol, tobacco or illicit drugs  . Current medications and supplements . Functional ability and status . Nutritional status . Physical activity . Advanced directives . List of other physicians . Hospitalizations, surgeries, and ER visits in previous 12 months . Vitals . Screenings to include cognitive, depression, and falls . Referrals and appointments  In addition, I have reviewed and discussed with patient certain preventive protocols, quality metrics, and best practice recommendations. A written personalized care plan for preventive services as well as general preventive health recommendations were provided to patient.     Gerilyn Nestle, RN  04/24/2018  Reviewed documentation provided by RN and agree w/ above.  Annye Asa, MD

## 2018-04-24 ENCOUNTER — Other Ambulatory Visit: Payer: Self-pay | Admitting: General Practice

## 2018-04-24 ENCOUNTER — Ambulatory Visit (INDEPENDENT_AMBULATORY_CARE_PROVIDER_SITE_OTHER): Payer: Medicare HMO | Admitting: Family Medicine

## 2018-04-24 ENCOUNTER — Encounter: Payer: Self-pay | Admitting: Family Medicine

## 2018-04-24 ENCOUNTER — Ambulatory Visit (INDEPENDENT_AMBULATORY_CARE_PROVIDER_SITE_OTHER): Payer: Medicare HMO

## 2018-04-24 ENCOUNTER — Other Ambulatory Visit: Payer: Self-pay

## 2018-04-24 VITALS — BP 150/90 | HR 68 | Temp 98.1°F | Resp 16 | Ht 64.0 in | Wt 234.0 lb

## 2018-04-24 DIAGNOSIS — Z0000 Encounter for general adult medical examination without abnormal findings: Secondary | ICD-10-CM | POA: Diagnosis not present

## 2018-04-24 DIAGNOSIS — E669 Obesity, unspecified: Secondary | ICD-10-CM | POA: Diagnosis not present

## 2018-04-24 DIAGNOSIS — M549 Dorsalgia, unspecified: Secondary | ICD-10-CM

## 2018-04-24 DIAGNOSIS — R2689 Other abnormalities of gait and mobility: Secondary | ICD-10-CM

## 2018-04-24 DIAGNOSIS — E559 Vitamin D deficiency, unspecified: Secondary | ICD-10-CM

## 2018-04-24 DIAGNOSIS — E538 Deficiency of other specified B group vitamins: Secondary | ICD-10-CM

## 2018-04-24 DIAGNOSIS — G8929 Other chronic pain: Secondary | ICD-10-CM

## 2018-04-24 DIAGNOSIS — Z23 Encounter for immunization: Secondary | ICD-10-CM

## 2018-04-24 DIAGNOSIS — I10 Essential (primary) hypertension: Secondary | ICD-10-CM

## 2018-04-24 DIAGNOSIS — G5793 Unspecified mononeuropathy of bilateral lower limbs: Secondary | ICD-10-CM

## 2018-04-24 LAB — HEPATIC FUNCTION PANEL
ALT: 14 U/L (ref 0–35)
AST: 16 U/L (ref 0–37)
Albumin: 4.2 g/dL (ref 3.5–5.2)
Alkaline Phosphatase: 64 U/L (ref 39–117)
BILIRUBIN TOTAL: 0.6 mg/dL (ref 0.2–1.2)
Bilirubin, Direct: 0.1 mg/dL (ref 0.0–0.3)
Total Protein: 6.2 g/dL (ref 6.0–8.3)

## 2018-04-24 LAB — CBC WITH DIFFERENTIAL/PLATELET
BASOS PCT: 0.8 % (ref 0.0–3.0)
Basophils Absolute: 0 10*3/uL (ref 0.0–0.1)
EOS ABS: 0.1 10*3/uL (ref 0.0–0.7)
Eosinophils Relative: 1.7 % (ref 0.0–5.0)
HCT: 43.2 % (ref 36.0–46.0)
HEMOGLOBIN: 14.7 g/dL (ref 12.0–15.0)
Lymphocytes Relative: 22 % (ref 12.0–46.0)
Lymphs Abs: 1.1 10*3/uL (ref 0.7–4.0)
MCHC: 34.2 g/dL (ref 30.0–36.0)
MCV: 93.8 fl (ref 78.0–100.0)
Monocytes Absolute: 0.4 10*3/uL (ref 0.1–1.0)
Monocytes Relative: 8 % (ref 3.0–12.0)
Neutro Abs: 3.5 10*3/uL (ref 1.4–7.7)
Neutrophils Relative %: 67.5 % (ref 43.0–77.0)
Platelets: 163 10*3/uL (ref 150.0–400.0)
RBC: 4.6 Mil/uL (ref 3.87–5.11)
RDW: 13 % (ref 11.5–15.5)
WBC: 5.2 10*3/uL (ref 4.0–10.5)

## 2018-04-24 LAB — LIPID PANEL
CHOL/HDL RATIO: 3
Cholesterol: 173 mg/dL (ref 0–200)
HDL: 53.2 mg/dL (ref >39.00)
NONHDL: 119.84
Triglycerides: 213 mg/dL — ABNORMAL HIGH (ref 0.0–149.0)
VLDL: 42.6 mg/dL — ABNORMAL HIGH (ref 0.0–40.0)

## 2018-04-24 LAB — BASIC METABOLIC PANEL
BUN: 14 mg/dL (ref 6–23)
CO2: 29 meq/L (ref 19–32)
Calcium: 9.5 mg/dL (ref 8.4–10.5)
Chloride: 105 mEq/L (ref 96–112)
Creatinine, Ser: 0.82 mg/dL (ref 0.40–1.20)
GFR: 72.83 mL/min (ref >60.00)
Glucose, Bld: 105 mg/dL — ABNORMAL HIGH (ref 70–99)
Potassium: 4 mEq/L (ref 3.5–5.1)
Sodium: 142 mEq/L (ref 135–145)

## 2018-04-24 LAB — TSH: TSH: 1.28 u[IU]/mL (ref 0.35–4.50)

## 2018-04-24 LAB — LDL CHOLESTEROL, DIRECT: LDL DIRECT: 80 mg/dL

## 2018-04-24 LAB — VITAMIN D 25 HYDROXY (VIT D DEFICIENCY, FRACTURES): VITD: 51.16 ng/mL (ref 30.00–100.00)

## 2018-04-24 LAB — VITAMIN B12: VITAMIN B 12: 245 pg/mL (ref 211–911)

## 2018-04-24 MED ORDER — GABAPENTIN 600 MG PO TABS: 600.0000 mg | ORAL_TABLET | Freq: Every day | ORAL | 3 refills | Status: DC

## 2018-04-24 MED ORDER — HYDROCODONE-ACETAMINOPHEN 5-325 MG PO TABS: 1.0000 | ORAL_TABLET | Freq: Four times a day (QID) | ORAL | 0 refills | Status: DC | PRN

## 2018-04-24 MED ORDER — GABAPENTIN 600 MG PO TABS: 600.0000 mg | ORAL_TABLET | Freq: Three times a day (TID) | ORAL | 3 refills | Status: DC

## 2018-04-24 MED ORDER — LISINOPRIL 30 MG PO TABS: 30.0000 mg | ORAL_TABLET | Freq: Every day | ORAL | 3 refills | Status: DC

## 2018-04-24 NOTE — Assessment & Plan Note (Signed)
Ongoing issue.  Check B12 level.  Increase gabapentin to 600mg  QHS.  Refer to neurology for complete evaluation.

## 2018-04-24 NOTE — Assessment & Plan Note (Signed)
New to provider, ongoing for pt.  She was unable to get onto the table w/o considerable difficulty.  Refer to neuro for complete evaluation.

## 2018-04-24 NOTE — Assessment & Plan Note (Signed)
Deteriorated.  BP is elevated after multiple readings in the office.  She complains of feeling 'foggy headed' which may be related.  Increase Lisinopril to 30mg  daily and monitor closely for improvement.

## 2018-04-24 NOTE — Assessment & Plan Note (Signed)
Check labs and replete prn. 

## 2018-04-24 NOTE — Progress Notes (Signed)
   Subjective:    Patient ID: Ashley Woods, female    DOB: [birth year], 72 y.o.   MRN: [id number]  HPI CPE- UTD on flu.  Has mammo scheduled.  Pt will call for colonoscopy.   Review of Systems Patient reports no hearing changes, adenopathy,fever, weight change,  persistant/recurrent hoarseness , swallowing issues, chest pain, palpitations, edema, persistant/recurrent cough, hemoptysis, dyspnea (rest/exertional/paroxysmal nocturnal), gastrointestinal bleeding (melena, rectal bleeding), abdominal pain, significant heartburn, bowel changes, GU symptoms (dysuria, hematuria, incontinence), Gyn symptoms (abnormal  bleeding, pain),  syncope, focal weakness, skin/hair/nail changes, abnormal bruising or bleeding, anxiety, or depression.   HTN- BP is elevated today.  On Lisinopril 20mg  daily + 'foggy headed' w/ some intermittent memory issues.  Has sleep study scheduled for Sunday night + tingling in feet bilaterally, extending up to the knees, only occurring at night. + blurry vision- eye exam pending + difficulty w/ balance- 'all the time'    Objective:   Physical Exam General Appearance:    Alert, cooperative, no distress, appears stated age, obese  Head:    Normocephalic, without obvious abnormality, atraumatic  Eyes:    PERRL, conjunctiva/corneas clear, EOM's intact, fundi    benign, both eyes  Ears:    Normal TM's and external ear canals, both ears  Nose:   Nares normal, septum midline, mucosa normal, no drainage    or sinus tenderness  Throat:   Lips, mucosa, and tongue normal; teeth and gums normal  Neck:   Supple, symmetrical, trachea midline, no adenopathy;    Thyroid: no enlargement/tenderness/nodules  Back:     Symmetric, no curvature, ROM normal, no CVA tenderness  Lungs:     Clear to auscultation bilaterally, respirations unlabored  Chest Wall:    No tenderness or deformity   Heart:    Regular rate and rhythm, S1 and S2 normal, no murmur, rub   or gallop  Breast Exam:     Deferred to GYN  Abdomen:     Soft, non-tender, bowel sounds active all four quadrants,    no masses, no organomegaly  Genitalia:    Deferred to GYN  Rectal:    Extremities:   Extremities normal, atraumatic, no cyanosis, trace to 1+ edema of LEs bilaterally  Pulses:   2+ and symmetric all extremities  Skin:   Skin color, texture, turgor normal, no rashes or lesions  Lymph nodes:   Cervical, supraclavicular, and axillary nodes normal  Neurologic:   CNII-XII intact, pt unable to step up and turn around to sit on table w/o considerable difficulty         Assessment & Plan:

## 2018-04-24 NOTE — Assessment & Plan Note (Signed)
Pt has hx of this.  Check labs and replete prn. 

## 2018-04-24 NOTE — Patient Instructions (Addendum)
Follow up in 1 month to recheck BP We'll notify you of your lab results and make any changes if needed Please send me a copy of your mammogram Call and schedule your colonoscopy We'll call you with your Neurology appt INCREASE your Gabapentin to 600mg - 2 of what you have at home and 1 of the new prescription INCREASE the Lisinopril to 30mg  daily- new prescription sent Call with any questions or concerns Happy Early Birthday!!!

## 2018-04-24 NOTE — Patient Instructions (Addendum)
Schedule colonoscopy.   Continue doing brain stimulating activities (puzzles, reading, adult coloring books, staying active) to keep memory sharp.   Bring a copy of your living will and/or healthcare power of attorney to your next office visit.   Health Maintenance, Female Adopting a healthy lifestyle and getting preventive care can go a long way to promote health and wellness. Talk with your health care provider about what schedule of regular examinations is right for you. This is a good chance for you to check in with your provider about disease prevention and staying healthy. In between checkups, there are plenty of things you can do on your own. Experts have done a lot of research about which lifestyle changes and preventive measures are most likely to keep you healthy. Ask your health care provider for more information. Weight and diet Eat a healthy diet  Be sure to include plenty of vegetables, fruits, low-fat dairy products, and lean protein.  Do not eat a lot of foods high in solid fats, added sugars, or salt.  Get regular exercise. This is one of the most important things you can do for your health. ? Most adults should exercise for at least 150 minutes each week. The exercise should increase your heart rate and make you sweat (moderate-intensity exercise). ? Most adults should also do strengthening exercises at least twice a week. This is in addition to the moderate-intensity exercise.  Maintain a healthy weight  Body mass index (BMI) is a measurement that can be used to identify possible weight problems. It estimates body fat based on height and weight. Your health care provider can help determine your BMI and help you achieve or maintain a healthy weight.  For females 36 years of age and older: ? A BMI below 18.5 is considered underweight. ? A BMI of 18.5 to 24.9 is normal. ? A BMI of 25 to 29.9 is considered overweight. ? A BMI of 30 and above is considered obese.  Watch  levels of cholesterol and blood lipids  You should start having your blood tested for lipids and cholesterol at 72 years of age, then have this test every 5 years.  You may need to have your cholesterol levels checked more often if: ? Your lipid or cholesterol levels are high. ? You are older than 72 years of age. ? You are at high risk for heart disease.  Cancer screening Lung Cancer  Lung cancer screening is recommended for adults 22-36 years old who are at high risk for lung cancer because of a history of smoking.  A yearly low-dose CT scan of the lungs is recommended for people who: ? Currently smoke. ? Have quit within the past 15 years. ? Have at least a 30-pack-year history of smoking. A pack year is smoking an average of one pack of cigarettes a day for 1 year.  Yearly screening should continue until it has been 15 years since you quit.  Yearly screening should stop if you develop a health problem that would prevent you from having lung cancer treatment.  Breast Cancer  Practice breast self-awareness. This means understanding how your breasts normally appear and feel.  It also means doing regular breast self-exams. Let your health care provider know about any changes, no matter how small.  If you are in your 20s or 30s, you should have a clinical breast exam (CBE) by a health care provider every 1-3 years as part of a regular health exam.  If you are 40 or  older, have a CBE every year. Also consider having a breast X-ray (mammogram) every year.  If you have a family history of breast cancer, talk to your health care provider about genetic screening.  If you are at high risk for breast cancer, talk to your health care provider about having an MRI and a mammogram every year.  Breast cancer gene (BRCA) assessment is recommended for women who have family members with BRCA-related cancers. BRCA-related cancers include: ? Breast. ? Ovarian. ? Tubal. ? Peritoneal  cancers.  Results of the assessment will determine the need for genetic counseling and BRCA1 and BRCA2 testing.  Cervical Cancer Your health care provider may recommend that you be screened regularly for cancer of the pelvic organs (ovaries, uterus, and vagina). This screening involves a pelvic examination, including checking for microscopic changes to the surface of your cervix (Pap test). You may be encouraged to have this screening done every 3 years, beginning at age 83.  For women ages 57-65, health care providers may recommend pelvic exams and Pap testing every 3 years, or they may recommend the Pap and pelvic exam, combined with testing for human papilloma virus (HPV), every 5 years. Some types of HPV increase your risk of cervical cancer. Testing for HPV may also be done on women of any age with unclear Pap test results.  Other health care providers may not recommend any screening for nonpregnant women who are considered low risk for pelvic cancer and who do not have symptoms. Ask your health care provider if a screening pelvic exam is right for you.  If you have had past treatment for cervical cancer or a condition that could lead to cancer, you need Pap tests and screening for cancer for at least 20 years after your treatment. If Pap tests have been discontinued, your risk factors (such as having a new sexual partner) need to be reassessed to determine if screening should resume. Some women have medical problems that increase the chance of getting cervical cancer. In these cases, your health care provider may recommend more frequent screening and Pap tests.  Colorectal Cancer  This type of cancer can be detected and often prevented.  Routine colorectal cancer screening usually begins at 72 years of age and continues through 72 years of age.  Your health care provider may recommend screening at an earlier age if you have risk factors for colon cancer.  Your health care provider may also  recommend using home test kits to check for hidden blood in the stool.  A small camera at the end of a tube can be used to examine your colon directly (sigmoidoscopy or colonoscopy). This is done to check for the earliest forms of colorectal cancer.  Routine screening usually begins at age 43.  Direct examination of the colon should be repeated every 5-10 years through 72 years of age. However, you may need to be screened more often if early forms of precancerous polyps or small growths are found.  Skin Cancer  Check your skin from head to toe regularly.  Tell your health care provider about any new moles or changes in moles, especially if there is a change in a mole's shape or color.  Also tell your health care provider if you have a mole that is larger than the size of a pencil eraser.  Always use sunscreen. Apply sunscreen liberally and repeatedly throughout the day.  Protect yourself by wearing long sleeves, pants, a wide-brimmed hat, and sunglasses whenever you are outside.  Heart disease, diabetes, and high blood pressure  High blood pressure causes heart disease and increases the risk of stroke. High blood pressure is more likely to develop in: ? People who have blood pressure in the high end of the normal range (130-139/85-89 mm Hg). ? People who are overweight or obese. ? People who are African American.  If you are 70-78 years of age, have your blood pressure checked every 3-5 years. If you are 24 years of age or older, have your blood pressure checked every year. You should have your blood pressure measured twice-once when you are at a hospital or clinic, and once when you are not at a hospital or clinic. Record the average of the two measurements. To check your blood pressure when you are not at a hospital or clinic, you can use: ? An automated blood pressure machine at a pharmacy. ? A home blood pressure monitor.  If you are between 77 years and 5 years old, ask your  health care provider if you should take aspirin to prevent strokes.  Have regular diabetes screenings. This involves taking a blood sample to check your fasting blood sugar level. ? If you are at a normal weight and have a low risk for diabetes, have this test once every three years after 72 years of age. ? If you are overweight and have a high risk for diabetes, consider being tested at a younger age or more often. Preventing infection Hepatitis B  If you have a higher risk for hepatitis B, you should be screened for this virus. You are considered at high risk for hepatitis B if: ? You were born in a country where hepatitis B is common. Ask your health care provider which countries are considered high risk. ? Your parents were born in a high-risk country, and you have not been immunized against hepatitis B (hepatitis B vaccine). ? You have HIV or AIDS. ? You use needles to inject street drugs. ? You live with someone who has hepatitis B. ? You have had sex with someone who has hepatitis B. ? You get hemodialysis treatment. ? You take certain medicines for conditions, including cancer, organ transplantation, and autoimmune conditions.  Hepatitis C  Blood testing is recommended for: ? Everyone born from [birth year] through 1965. ? Anyone with known risk factors for hepatitis C.  Sexually transmitted infections (STIs)  You should be screened for sexually transmitted infections (STIs) including gonorrhea and chlamydia if: ? You are sexually active and are younger than 72 years of age. ? You are older than 72 years of age and your health care provider tells you that you are at risk for this type of infection. ? Your sexual activity has changed since you were last screened and you are at an increased risk for chlamydia or gonorrhea. Ask your health care provider if you are at risk.  If you do not have HIV, but are at risk, it may be recommended that you take a prescription medicine daily to  prevent HIV infection. This is called pre-exposure prophylaxis (PrEP). You are considered at risk if: ? You are sexually active and do not regularly use condoms or know the HIV status of your partner(s). ? You take drugs by injection. ? You are sexually active with a partner who has HIV.  Talk with your health care provider about whether you are at high risk of being infected with HIV. If you choose to begin PrEP, you should first be tested for HIV.  You should then be tested every 3 months for as long as you are taking PrEP. Pregnancy  If you are premenopausal and you may become pregnant, ask your health care provider about preconception counseling.  If you may become pregnant, take 400 to 800 micrograms (mcg) of folic acid every day.  If you want to prevent pregnancy, talk to your health care provider about birth control (contraception). Osteoporosis and menopause  Osteoporosis is a disease in which the bones lose minerals and strength with aging. This can result in serious bone fractures. Your risk for osteoporosis can be identified using a bone density scan.  If you are 12 years of age or older, or if you are at risk for osteoporosis and fractures, ask your health care provider if you should be screened.  Ask your health care provider whether you should take a calcium or vitamin D supplement to lower your risk for osteoporosis.  Menopause may have certain physical symptoms and risks.  Hormone replacement therapy may reduce some of these symptoms and risks. Talk to your health care provider about whether hormone replacement therapy is right for you. Follow these instructions at home:  Schedule regular health, dental, and eye exams.  Stay current with your immunizations.  Do not use any tobacco products including cigarettes, chewing tobacco, or electronic cigarettes.  If you are pregnant, do not drink alcohol.  If you are breastfeeding, limit how much and how often you drink  alcohol.  Limit alcohol intake to no more than 1 drink per day for nonpregnant women. One drink equals 12 ounces of beer, 5 ounces of wine, or 1 ounces of hard liquor.  Do not use street drugs.  Do not share needles.  Ask your health care provider for help if you need support or information about quitting drugs.  Tell your health care provider if you often feel depressed.  Tell your health care provider if you have ever been abused or do not feel safe at home. This information is not intended to replace advice given to you by your health care provider. Make sure you discuss any questions you have with your health care provider. Document Released: 01/30/2011 Document Revised: 12/23/2015 Document Reviewed: 04/20/2015 Elsevier Interactive Patient Education  [employer].

## 2018-04-24 NOTE — Assessment & Plan Note (Signed)
Pt's PE unchanged from previous and WNL w/ exception of obesity and difficulty getting onto table.  Due for mammo and colonoscopy- pt to schedule.  UTD on immunizations.  Check labs.  Anticipatory guidance provided.

## 2018-04-25 ENCOUNTER — Encounter: Payer: Self-pay | Admitting: General Practice

## 2018-04-28 DIAGNOSIS — G4733 Obstructive sleep apnea (adult) (pediatric): Secondary | ICD-10-CM | POA: Diagnosis not present

## 2018-04-30 ENCOUNTER — Other Ambulatory Visit: Payer: Self-pay | Admitting: Family Medicine

## 2018-05-01 DIAGNOSIS — G4733 Obstructive sleep apnea (adult) (pediatric): Secondary | ICD-10-CM | POA: Diagnosis not present

## 2018-05-03 DIAGNOSIS — G4733 Obstructive sleep apnea (adult) (pediatric): Secondary | ICD-10-CM | POA: Diagnosis not present

## 2018-05-03 DIAGNOSIS — G603 Idiopathic progressive neuropathy: Secondary | ICD-10-CM | POA: Diagnosis not present

## 2018-05-03 DIAGNOSIS — R5383 Other fatigue: Secondary | ICD-10-CM | POA: Diagnosis not present

## 2018-05-03 DIAGNOSIS — R4182 Altered mental status, unspecified: Secondary | ICD-10-CM | POA: Diagnosis not present

## 2018-05-06 DIAGNOSIS — Z79899 Other long term (current) drug therapy: Secondary | ICD-10-CM | POA: Diagnosis not present

## 2018-05-06 DIAGNOSIS — G609 Hereditary and idiopathic neuropathy, unspecified: Secondary | ICD-10-CM | POA: Diagnosis not present

## 2018-05-06 DIAGNOSIS — E559 Vitamin D deficiency, unspecified: Secondary | ICD-10-CM | POA: Diagnosis not present

## 2018-05-08 ENCOUNTER — Ambulatory Visit (INDEPENDENT_AMBULATORY_CARE_PROVIDER_SITE_OTHER): Payer: Medicare HMO

## 2018-05-08 DIAGNOSIS — Z1231 Encounter for screening mammogram for malignant neoplasm of breast: Secondary | ICD-10-CM

## 2018-05-10 DIAGNOSIS — G4733 Obstructive sleep apnea (adult) (pediatric): Secondary | ICD-10-CM | POA: Diagnosis not present

## 2018-05-15 DIAGNOSIS — R4182 Altered mental status, unspecified: Secondary | ICD-10-CM | POA: Diagnosis not present

## 2018-05-20 DIAGNOSIS — R4182 Altered mental status, unspecified: Secondary | ICD-10-CM | POA: Diagnosis not present

## 2018-05-20 DIAGNOSIS — G603 Idiopathic progressive neuropathy: Secondary | ICD-10-CM | POA: Diagnosis not present

## 2018-05-20 DIAGNOSIS — M5417 Radiculopathy, lumbosacral region: Secondary | ICD-10-CM | POA: Diagnosis not present

## 2018-05-20 DIAGNOSIS — R2689 Other abnormalities of gait and mobility: Secondary | ICD-10-CM | POA: Diagnosis not present

## 2018-05-20 DIAGNOSIS — G4733 Obstructive sleep apnea (adult) (pediatric): Secondary | ICD-10-CM | POA: Diagnosis not present

## 2018-06-05 ENCOUNTER — Other Ambulatory Visit: Payer: Self-pay | Admitting: Family Medicine

## 2018-06-05 DIAGNOSIS — M4807 Spinal stenosis, lumbosacral region: Secondary | ICD-10-CM | POA: Diagnosis not present

## 2018-06-10 DIAGNOSIS — G4733 Obstructive sleep apnea (adult) (pediatric): Secondary | ICD-10-CM | POA: Diagnosis not present

## 2018-06-11 DIAGNOSIS — S92525A Nondisplaced fracture of medial phalanx of left lesser toe(s), initial encounter for closed fracture: Secondary | ICD-10-CM | POA: Diagnosis not present

## 2018-06-11 DIAGNOSIS — M79672 Pain in left foot: Secondary | ICD-10-CM | POA: Diagnosis not present

## 2018-07-10 DIAGNOSIS — G4733 Obstructive sleep apnea (adult) (pediatric): Secondary | ICD-10-CM | POA: Diagnosis not present

## 2018-07-12 DIAGNOSIS — G603 Idiopathic progressive neuropathy: Secondary | ICD-10-CM | POA: Diagnosis not present

## 2018-07-12 DIAGNOSIS — R5383 Other fatigue: Secondary | ICD-10-CM | POA: Diagnosis not present

## 2018-07-12 DIAGNOSIS — M545 Low back pain: Secondary | ICD-10-CM | POA: Diagnosis not present

## 2018-07-12 DIAGNOSIS — G4733 Obstructive sleep apnea (adult) (pediatric): Secondary | ICD-10-CM | POA: Diagnosis not present

## 2018-07-12 DIAGNOSIS — R4182 Altered mental status, unspecified: Secondary | ICD-10-CM | POA: Diagnosis not present

## 2018-07-26 ENCOUNTER — Other Ambulatory Visit: Payer: Self-pay | Admitting: Family Medicine

## 2018-07-26 NOTE — Telephone Encounter (Signed)
Last OV 04/24/18  [place] Controlled Substance Database checked. Last filled on 04/01/18

## 2018-07-27 ENCOUNTER — Other Ambulatory Visit: Payer: Self-pay | Admitting: Family Medicine

## 2018-07-29 ENCOUNTER — Ambulatory Visit: Payer: Self-pay

## 2018-07-29 NOTE — Telephone Encounter (Signed)
Pt called with C/O extreme sweating and dizziness.  She denies chest pain shoulder pain, jaw pain, and arm pain. She states she has been having problems with her reflux. She states that hurts in her chest into her back. She states the episodes have been coming and going. Her HR in the 60's.The sweating spells last 5-10 minutes.  She is not diabetic.  She state that she has tried eating when she feel this way but it does nothing to change her symptoms.  She has recently been taking B 12 injections.  She has had some Cortizone injections. Per protocol pt will be see at the ER today. Care advice read to patient. Pt verbalized understanding of all instructions.  Reason for Disposition . Dizziness or lightheadedness  Answer Assessment - Initial Assessment Questions 1. LOCATION: "Where does it hurt?"       No pain 2. RADIATION: "Does the pain go anywhere else?" (e.g., into neck, jaw, arms, back)     no 3. ONSET: "When did the chest pain begin?" (Minutes, hours or days)      None just indigestion 4. PATTERN "Does the pain come and go, or has it been constant since it started?"  "Does it get worse with exertion?"      Sweating comes and goes feels lightheaded 5. DURATION: "How long does it last" (e.g., seconds, minutes, hours)     5-10 minutes 6. SEVERITY: "How bad is the pain?"  (e.g., Scale 1-10; mild, moderate, or severe)    - MILD (1-3): doesn't interfere with normal activities     - MODERATE (4-7): interferes with normal activities or awakens from sleep    - SEVERE (8-10): excruciating pain, unable to do any normal activities       Worse sometimes 7. CARDIAC RISK FACTORS: "Do you have any history of heart problems or risk factors for heart disease?" (e.g., prior heart attack, angina; high blood pressure, diabetes, being overweight, high cholesterol, smoking, or strong family history of heart disease)     no 8. PULMONARY RISK FACTORS: "Do you have any history of lung disease?"  (e.g., blood clots  in lung, asthma, emphysema, birth control pills)     no 9. CAUSE: "What do you think is causing the chest pain?"     Unsure Vit B shots 10. OTHER SYMPTOMS: "Do you have any other symptoms?" (e.g., dizziness, nausea, vomiting, sweating, fever, difficulty breathing, cough)       dizziness 11. PREGNANCY: "Is there any chance you are pregnant?" "When was your last menstrual period?"       N/A  Protocols used: CHEST PAIN-A-AH

## 2018-07-29 NOTE — Telephone Encounter (Signed)
Agree w/ ER assessment

## 2018-08-10 DIAGNOSIS — G4733 Obstructive sleep apnea (adult) (pediatric): Secondary | ICD-10-CM | POA: Diagnosis not present

## 2018-08-13 ENCOUNTER — Other Ambulatory Visit: Payer: Self-pay | Admitting: *Deleted

## 2018-08-13 DIAGNOSIS — M549 Dorsalgia, unspecified: Principal | ICD-10-CM

## 2018-08-13 DIAGNOSIS — G8929 Other chronic pain: Secondary | ICD-10-CM

## 2018-08-13 NOTE — Telephone Encounter (Signed)
Last OV: 04/24/18 Last Fill: 04/24/18  #60 no refills

## 2018-08-14 MED ORDER — HYDROCODONE-ACETAMINOPHEN 5-325 MG PO TABS: 1.0000 | ORAL_TABLET | Freq: Four times a day (QID) | ORAL | 0 refills | Status: DC | PRN

## 2018-08-23 ENCOUNTER — Other Ambulatory Visit: Payer: Self-pay | Admitting: Family Medicine

## 2018-09-02 ENCOUNTER — Encounter: Payer: Self-pay | Admitting: Family Medicine

## 2018-09-02 ENCOUNTER — Ambulatory Visit (INDEPENDENT_AMBULATORY_CARE_PROVIDER_SITE_OTHER): Payer: PPO | Admitting: Family Medicine

## 2018-09-02 ENCOUNTER — Other Ambulatory Visit: Payer: Self-pay

## 2018-09-02 VITALS — BP 141/78 | HR 82 | Temp 97.9°F | Resp 17 | Ht 64.0 in | Wt 236.1 lb

## 2018-09-02 DIAGNOSIS — E038 Other specified hypothyroidism: Secondary | ICD-10-CM | POA: Diagnosis not present

## 2018-09-02 DIAGNOSIS — R232 Flushing: Secondary | ICD-10-CM

## 2018-09-02 DIAGNOSIS — H6591 Unspecified nonsuppurative otitis media, right ear: Secondary | ICD-10-CM | POA: Diagnosis not present

## 2018-09-02 MED ORDER — AMOXICILLIN 875 MG PO TABS: 875.0000 mg | ORAL_TABLET | Freq: Two times a day (BID) | ORAL | 0 refills | Status: DC

## 2018-09-02 MED ORDER — FLUTICASONE PROPIONATE 50 MCG/ACT NA SUSP: 2.0000 | Freq: Every day | NASAL | 6 refills | Status: DC

## 2018-09-02 MED ORDER — GUAIFENESIN-CODEINE 100-10 MG/5ML PO SYRP: 10.0000 mL | ORAL_SOLUTION | Freq: Three times a day (TID) | ORAL | 0 refills | Status: DC | PRN

## 2018-09-02 NOTE — Patient Instructions (Addendum)
Follow up as needed or as scheduled We'll notify you of your lab results and make any changes if needed START the Amoxicillin twice daily- take w/ food- for the ear infection ADD daily Claritin or Zyrtec to decrease congestion and post-nasal drip START Flonase- 2 sprays each nostril daily Drink lots of fluids! REST your voice Call with any questions or concerns Hang in there!!

## 2018-09-02 NOTE — Assessment & Plan Note (Signed)
Pt having intermittent sweats and hot flashes.  Check labs.  Adjust meds prn

## 2018-09-02 NOTE — Progress Notes (Signed)
   Subjective:    Patient ID: Ashley Woods, female    DOB: 1946-05-06, 73 y.o.   MRN: [id number]  HPI URI- started 'a dry hacky cough' ~10 days ago.  + laryngitis.  R ear pain.  Intermittent, mild dizziness.  No fevers.  Some nausea.  + sick contacts.  No facial pain/pressure.  No headaches.  + PND.  Sweats- pt reports intermittent sweats x2 weeks.  Is concerned about thyroid.   Review of Systems For ROS see HPI     Objective:   Physical Exam Vitals signs reviewed.  Constitutional:      General: She is not in acute distress.    Appearance: Normal appearance. She is obese.  HENT:     Head: Normocephalic and atraumatic.     Left Ear: Tympanic membrane and ear canal normal.     Ears:     Comments: R TM dull, bulging w/ middle ear effusion present    Nose:     Comments: No TTP over frontal or maxillary sinuses    Mouth/Throat:     Mouth: Mucous membranes are moist.     Pharynx: No oropharyngeal exudate or posterior oropharyngeal erythema.     Comments: + PND Neck:     Musculoskeletal: Normal range of motion and neck supple.  Cardiovascular:     Rate and Rhythm: Normal rate and regular rhythm.  Pulmonary:     Effort: Pulmonary effort is normal. No respiratory distress.     Breath sounds: Normal breath sounds. No wheezing or rhonchi.     Comments: No cough heard Lymphadenopathy:     Cervical: No cervical adenopathy.  Skin:    General: Skin is warm and dry.  Neurological:     General: No focal deficit present.     Mental Status: She is alert and oriented to person, place, and time.  Psychiatric:        Mood and Affect: Mood normal.        Behavior: Behavior normal.        Thought Content: Thought content normal.           Assessment & Plan:  R OM- new.  Pt's sxs and PE consistent w/ infxn.  Start Amoxicillin.  Cough meds prn.  Pt was informed this was likely due to PND.  Start daily antihistamine and nasal steroid.  Reviewed supportive care and red flags that  should prompt return.  Pt expressed understanding and is in agreement w/ plan.   Hot flashes- pt reports intermittent sweats x2 weeks.  She is concerned this is her thyroid.  Could also be due to her current ear infection.  Check labs and tx any abnormalities if present.  Pt expressed understanding and is in agreement w/ plan.

## 2018-09-03 LAB — CBC WITH DIFFERENTIAL/PLATELET
Basophils Absolute: 0.1 10*3/uL (ref 0.0–0.1)
Basophils Relative: 1.6 % (ref 0.0–3.0)
Eosinophils Absolute: 0.1 10*3/uL (ref 0.0–0.7)
Eosinophils Relative: 1.9 % (ref 0.0–5.0)
HCT: 39.8 % (ref 36.0–46.0)
Hemoglobin: 13.4 g/dL (ref 12.0–15.0)
LYMPHS ABS: 1 10*3/uL (ref 0.7–4.0)
Lymphocytes Relative: 17.9 % (ref 12.0–46.0)
MCHC: 33.7 g/dL (ref 30.0–36.0)
MCV: 97.3 fl (ref 78.0–100.0)
Monocytes Absolute: 0.4 10*3/uL (ref 0.1–1.0)
Monocytes Relative: 7.7 % (ref 3.0–12.0)
Neutro Abs: 4.1 10*3/uL (ref 1.4–7.7)
Neutrophils Relative %: 70.9 % (ref 43.0–77.0)
Platelets: 204 10*3/uL (ref 150.0–400.0)
RBC: 4.09 Mil/uL (ref 3.87–5.11)
RDW: 13.2 % (ref 11.5–15.5)
WBC: 5.8 10*3/uL (ref 4.0–10.5)

## 2018-09-03 LAB — BASIC METABOLIC PANEL
BUN: 18 mg/dL (ref 6–23)
CO2: 28 mEq/L (ref 19–32)
Calcium: 9.5 mg/dL (ref 8.4–10.5)
Chloride: 106 mEq/L (ref 96–112)
Creatinine, Ser: 0.75 mg/dL (ref 0.40–1.20)
GFR: 75.88 mL/min (ref >60.00)
Glucose, Bld: 116 mg/dL — ABNORMAL HIGH (ref 70–99)
Potassium: 4 mEq/L (ref 3.5–5.1)
SODIUM: 143 meq/L (ref 135–145)

## 2018-09-03 LAB — TSH: TSH: 1.23 u[IU]/mL (ref 0.35–4.50)

## 2018-09-08 DIAGNOSIS — G4733 Obstructive sleep apnea (adult) (pediatric): Secondary | ICD-10-CM | POA: Diagnosis not present

## 2018-09-09 ENCOUNTER — Other Ambulatory Visit: Payer: Self-pay | Admitting: Family Medicine

## 2018-09-10 DIAGNOSIS — G4733 Obstructive sleep apnea (adult) (pediatric): Secondary | ICD-10-CM | POA: Diagnosis not present

## 2018-10-08 DIAGNOSIS — G4733 Obstructive sleep apnea (adult) (pediatric): Secondary | ICD-10-CM | POA: Diagnosis not present

## 2018-10-09 DIAGNOSIS — G4733 Obstructive sleep apnea (adult) (pediatric): Secondary | ICD-10-CM | POA: Diagnosis not present

## 2018-10-24 ENCOUNTER — Other Ambulatory Visit: Payer: Self-pay | Admitting: Family Medicine

## 2018-10-24 DIAGNOSIS — G4733 Obstructive sleep apnea (adult) (pediatric): Secondary | ICD-10-CM | POA: Diagnosis not present

## 2018-10-24 NOTE — Telephone Encounter (Signed)
Last Filled: 07/26/2018 #30, 0 Last OV: (acute) 09/02/2018, CPE - 04/24/2018

## 2018-10-28 ENCOUNTER — Telehealth: Payer: Self-pay | Admitting: Family Medicine

## 2018-10-28 ENCOUNTER — Other Ambulatory Visit: Payer: Self-pay

## 2018-10-28 DIAGNOSIS — E538 Deficiency of other specified B group vitamins: Secondary | ICD-10-CM | POA: Diagnosis not present

## 2018-10-28 DIAGNOSIS — M545 Low back pain: Secondary | ICD-10-CM | POA: Diagnosis not present

## 2018-10-28 DIAGNOSIS — R4182 Altered mental status, unspecified: Secondary | ICD-10-CM | POA: Diagnosis not present

## 2018-10-28 DIAGNOSIS — G603 Idiopathic progressive neuropathy: Secondary | ICD-10-CM | POA: Diagnosis not present

## 2018-10-28 DIAGNOSIS — R5383 Other fatigue: Secondary | ICD-10-CM | POA: Diagnosis not present

## 2018-10-28 DIAGNOSIS — G4733 Obstructive sleep apnea (adult) (pediatric): Secondary | ICD-10-CM | POA: Diagnosis not present

## 2018-10-28 MED ORDER — CITALOPRAM HYDROBROMIDE 40 MG PO TABS: 40.0000 mg | ORAL_TABLET | Freq: Every day | ORAL | 1 refills | Status: DC

## 2018-10-28 MED ORDER — FUROSEMIDE 20 MG PO TABS: 40.0000 mg | ORAL_TABLET | Freq: Every day | ORAL | 1 refills | Status: DC

## 2018-10-28 MED ORDER — SIMVASTATIN 40 MG PO TABS: 40.0000 mg | ORAL_TABLET | Freq: Every day | ORAL | 1 refills | Status: DC

## 2018-10-28 NOTE — Telephone Encounter (Signed)
Copied from [place] [id number]. Topic: Quick Communication - Rx Refill/Question >> Oct 28, 2018 12:59 PM Andria Frames L wrote: Medication: simvastatin (ZOCOR) 40 MG tablet [[id number]] furosemide (LASIX) 20 MG tablet [[id number]] citalopram (CELEXA) 40 MG tablet [[id number]]  Has the patient contacted their pharmacy? No  Preferred Pharmacy (with phone number or street name):[facility], [place] [phone] (Phone) [phone] (Fax)   Agent: Please be advised that RX refills may take up to 3 business days. We ask that you follow-up with your pharmacy.

## 2018-11-08 DIAGNOSIS — Z01812 Encounter for preprocedural laboratory examination: Secondary | ICD-10-CM | POA: Diagnosis not present

## 2018-11-08 DIAGNOSIS — Z20828 Contact with and (suspected) exposure to other viral communicable diseases: Secondary | ICD-10-CM | POA: Diagnosis not present

## 2018-11-08 DIAGNOSIS — G4733 Obstructive sleep apnea (adult) (pediatric): Secondary | ICD-10-CM | POA: Diagnosis not present

## 2018-11-09 DIAGNOSIS — L501 Idiopathic urticaria: Secondary | ICD-10-CM | POA: Diagnosis not present

## 2018-11-09 DIAGNOSIS — G4733 Obstructive sleep apnea (adult) (pediatric): Secondary | ICD-10-CM | POA: Diagnosis not present

## 2018-11-25 ENCOUNTER — Other Ambulatory Visit: Payer: Self-pay | Admitting: Family Medicine

## 2018-11-28 ENCOUNTER — Other Ambulatory Visit: Payer: Self-pay | Admitting: Family Medicine

## 2018-12-08 DIAGNOSIS — G4733 Obstructive sleep apnea (adult) (pediatric): Secondary | ICD-10-CM | POA: Diagnosis not present

## 2018-12-09 DIAGNOSIS — G4733 Obstructive sleep apnea (adult) (pediatric): Secondary | ICD-10-CM | POA: Diagnosis not present

## 2018-12-16 DIAGNOSIS — R5383 Other fatigue: Secondary | ICD-10-CM | POA: Diagnosis not present

## 2018-12-16 DIAGNOSIS — G4733 Obstructive sleep apnea (adult) (pediatric): Secondary | ICD-10-CM | POA: Diagnosis not present

## 2018-12-16 DIAGNOSIS — G603 Idiopathic progressive neuropathy: Secondary | ICD-10-CM | POA: Diagnosis not present

## 2018-12-16 DIAGNOSIS — R4182 Altered mental status, unspecified: Secondary | ICD-10-CM | POA: Diagnosis not present

## 2018-12-26 DIAGNOSIS — M545 Low back pain: Secondary | ICD-10-CM | POA: Diagnosis not present

## 2019-01-08 DIAGNOSIS — G4733 Obstructive sleep apnea (adult) (pediatric): Secondary | ICD-10-CM | POA: Diagnosis not present

## 2019-01-09 DIAGNOSIS — G4733 Obstructive sleep apnea (adult) (pediatric): Secondary | ICD-10-CM | POA: Diagnosis not present

## 2019-01-17 DIAGNOSIS — M5417 Radiculopathy, lumbosacral region: Secondary | ICD-10-CM | POA: Insufficient documentation

## 2019-01-23 DIAGNOSIS — I6523 Occlusion and stenosis of bilateral carotid arteries: Secondary | ICD-10-CM | POA: Diagnosis not present

## 2019-01-23 DIAGNOSIS — E785 Hyperlipidemia, unspecified: Secondary | ICD-10-CM | POA: Diagnosis not present

## 2019-01-23 DIAGNOSIS — I10 Essential (primary) hypertension: Secondary | ICD-10-CM | POA: Diagnosis not present

## 2019-01-23 DIAGNOSIS — Z87891 Personal history of nicotine dependence: Secondary | ICD-10-CM | POA: Diagnosis not present

## 2019-01-23 DIAGNOSIS — G4733 Obstructive sleep apnea (adult) (pediatric): Secondary | ICD-10-CM | POA: Diagnosis not present

## 2019-01-23 DIAGNOSIS — Z7982 Long term (current) use of aspirin: Secondary | ICD-10-CM | POA: Diagnosis not present

## 2019-01-27 DIAGNOSIS — M5416 Radiculopathy, lumbar region: Secondary | ICD-10-CM | POA: Diagnosis not present

## 2019-01-27 DIAGNOSIS — M5417 Radiculopathy, lumbosacral region: Secondary | ICD-10-CM | POA: Diagnosis not present

## 2019-01-31 ENCOUNTER — Other Ambulatory Visit: Payer: Self-pay | Admitting: Family Medicine

## 2019-02-03 NOTE — Telephone Encounter (Signed)
Last OV 09/02/18 Clonazepam last filled 10/23/00 #30 with 0

## 2019-02-07 DIAGNOSIS — G4733 Obstructive sleep apnea (adult) (pediatric): Secondary | ICD-10-CM | POA: Diagnosis not present

## 2019-02-08 DIAGNOSIS — G4733 Obstructive sleep apnea (adult) (pediatric): Secondary | ICD-10-CM | POA: Diagnosis not present

## 2019-02-23 ENCOUNTER — Other Ambulatory Visit: Payer: Self-pay | Admitting: Family Medicine

## 2019-03-10 DIAGNOSIS — G4733 Obstructive sleep apnea (adult) (pediatric): Secondary | ICD-10-CM | POA: Diagnosis not present

## 2019-04-10 DIAGNOSIS — G4733 Obstructive sleep apnea (adult) (pediatric): Secondary | ICD-10-CM | POA: Diagnosis not present

## 2019-04-24 ENCOUNTER — Other Ambulatory Visit: Payer: Self-pay | Admitting: Family Medicine

## 2019-04-24 DIAGNOSIS — G8929 Other chronic pain: Secondary | ICD-10-CM

## 2019-04-24 DIAGNOSIS — M549 Dorsalgia, unspecified: Secondary | ICD-10-CM

## 2019-04-24 NOTE — Telephone Encounter (Signed)
Last OV 06/11/18 Clonazepam last filled 02/03/19 #30 with 0

## 2019-04-24 NOTE — Telephone Encounter (Signed)
Hydrocodone last refill: 08/14/18 #60, 0

## 2019-04-25 MED ORDER — HYDROCODONE-ACETAMINOPHEN 5-325 MG PO TABS: 1.0000 | ORAL_TABLET | Freq: Four times a day (QID) | ORAL | 0 refills | Status: DC | PRN

## 2019-04-28 DIAGNOSIS — G4733 Obstructive sleep apnea (adult) (pediatric): Secondary | ICD-10-CM | POA: Diagnosis not present

## 2019-05-10 DIAGNOSIS — G4733 Obstructive sleep apnea (adult) (pediatric): Secondary | ICD-10-CM | POA: Diagnosis not present

## 2019-05-20 ENCOUNTER — Other Ambulatory Visit: Payer: Self-pay | Admitting: Family Medicine

## 2019-06-05 ENCOUNTER — Other Ambulatory Visit: Payer: Self-pay | Admitting: Family Medicine

## 2019-06-05 DIAGNOSIS — Z1231 Encounter for screening mammogram for malignant neoplasm of breast: Secondary | ICD-10-CM

## 2019-06-06 ENCOUNTER — Ambulatory Visit: Payer: PPO | Admitting: Family Medicine

## 2019-06-06 ENCOUNTER — Other Ambulatory Visit: Payer: Self-pay

## 2019-06-06 ENCOUNTER — Encounter: Payer: Self-pay | Admitting: Family Medicine

## 2019-06-06 ENCOUNTER — Ambulatory Visit (INDEPENDENT_AMBULATORY_CARE_PROVIDER_SITE_OTHER): Payer: PPO | Admitting: Family Medicine

## 2019-06-06 VITALS — BP 141/89 | HR 82 | Temp 98.0°F | Resp 16 | Ht 64.0 in | Wt 233.0 lb

## 2019-06-06 DIAGNOSIS — I10 Essential (primary) hypertension: Secondary | ICD-10-CM

## 2019-06-06 DIAGNOSIS — E559 Vitamin D deficiency, unspecified: Secondary | ICD-10-CM

## 2019-06-06 DIAGNOSIS — E538 Deficiency of other specified B group vitamins: Secondary | ICD-10-CM | POA: Diagnosis not present

## 2019-06-06 DIAGNOSIS — E785 Hyperlipidemia, unspecified: Secondary | ICD-10-CM

## 2019-06-06 DIAGNOSIS — Z23 Encounter for immunization: Secondary | ICD-10-CM

## 2019-06-06 DIAGNOSIS — M25511 Pain in right shoulder: Secondary | ICD-10-CM

## 2019-06-06 DIAGNOSIS — E038 Other specified hypothyroidism: Secondary | ICD-10-CM | POA: Diagnosis not present

## 2019-06-06 DIAGNOSIS — R600 Localized edema: Secondary | ICD-10-CM | POA: Diagnosis not present

## 2019-06-06 DIAGNOSIS — M25512 Pain in left shoulder: Secondary | ICD-10-CM | POA: Diagnosis not present

## 2019-06-06 MED ORDER — CITALOPRAM HYDROBROMIDE 40 MG PO TABS: 40.0000 mg | ORAL_TABLET | Freq: Every day | ORAL | 1 refills | Status: DC

## 2019-06-06 MED ORDER — TORSEMIDE 20 MG PO TABS: 20.0000 mg | ORAL_TABLET | Freq: Every day | ORAL | 3 refills | Status: DC

## 2019-06-06 MED ORDER — LEVOTHYROXINE SODIUM 100 MCG PO TABS: 100.0000 ug | ORAL_TABLET | Freq: Every day | ORAL | 0 refills | Status: DC

## 2019-06-06 MED ORDER — OXYBUTYNIN CHLORIDE 5 MG PO TABS: 5.0000 mg | ORAL_TABLET | Freq: Two times a day (BID) | ORAL | 0 refills | Status: DC

## 2019-06-06 MED ORDER — GABAPENTIN 600 MG PO TABS: 600.0000 mg | ORAL_TABLET | Freq: Every day | ORAL | 3 refills | Status: DC

## 2019-06-06 MED ORDER — FLUTICASONE PROPIONATE 50 MCG/ACT NA SUSP: 2.0000 | Freq: Every day | NASAL | 6 refills | Status: DC

## 2019-06-06 MED ORDER — OMEPRAZOLE 40 MG PO CPDR: 40.0000 mg | DELAYED_RELEASE_CAPSULE | Freq: Every day | ORAL | 1 refills | Status: DC

## 2019-06-06 MED ORDER — LISINOPRIL 30 MG PO TABS: 30.0000 mg | ORAL_TABLET | Freq: Every day | ORAL | 0 refills | Status: DC

## 2019-06-06 MED ORDER — SIMVASTATIN 40 MG PO TABS: 40.0000 mg | ORAL_TABLET | Freq: Every day | ORAL | 1 refills | Status: DC

## 2019-06-06 NOTE — Progress Notes (Signed)
Subjective:    Patient ID: Ashley Woods, female    DOB: Sep 27, 1945, 73 y.o.   MRN: [id number]  HPI Bilateral LE edema- 'severe swelling', R>L.  Legs develop intermittent redness.  sxs have developed over last 6 months.  + pain.  Swelling doesn't improve or resolve w/ elevation.  No improvement w/ 40mg  of Lasix.  Bilateral shoulder pain- denies injury.  'it feels like it's in the joint'.  R>L.  S/p rotator cuff surgery.  Pain over deltoids and upper arms bilaterally.  Has pain and difficulty initiating forward flexion and abduction.  HTN- chronic problem, ran out of Lisinopril today which is why BP is mildly elevated.  Denies CP, SOB above baseline, HAs.  Hyperlipidemia- chronic problem, on Simvastatin 40mg  daily.  No abd pain, N/V.  Obesity- pt is down 3 lbs since last visit.  Limited ability to exercise- is walking dog daily, not following particular diet  B12 deficiency- has been doing home injxns.  Due for repeat labs.   Review of Systems For ROS see HPI     Objective:   Physical Exam Vitals signs reviewed.  Constitutional:      General: She is not in acute distress.    Appearance: She is well-developed. She is obese.  HENT:     Head: Normocephalic and atraumatic.  Eyes:     Conjunctiva/sclera: Conjunctivae normal.     Pupils: Pupils are equal, round, and reactive to light.  Neck:     Musculoskeletal: Normal range of motion and neck supple.     Thyroid: No thyromegaly.  Cardiovascular:     Rate and Rhythm: Normal rate and regular rhythm.     Heart sounds: Normal heart sounds. No murmur.  Pulmonary:     Effort: Pulmonary effort is normal. No respiratory distress.     Breath sounds: Normal breath sounds.  Abdominal:     General: There is no distension.     Palpations: Abdomen is soft.     Tenderness: There is no abdominal tenderness.  Musculoskeletal:        General: No swelling or deformity (no visible deformity of either shoulder).     Right lower leg: Edema  (trace pitting edema w/ mild overlying erythema but no warmth) present.     Left lower leg: Edema (trace pitting edema w/ mild overlying erythema but no warmth or evidence of infxn) present.     Comments: Pain w/ initiation of forward flexion and abduction of both R and L shoulders Pain w/ R empty can test Pain w/ internal rotation of L arm  Lymphadenopathy:     Cervical: No cervical adenopathy.  Skin:    General: Skin is warm and dry.  Neurological:     Mental Status: She is alert and oriented to person, place, and time.  Psychiatric:        Behavior: Behavior normal.           Assessment & Plan:  Bilateral shoulder pain- new.  Pt's difficulty w/ initiation of motion indicates difficulty w/ Deltoids and likely supraspinatus muscles.  Unclear if this is arthritic or other, refer back to ortho for evaluation and tx.  Bilateral LE edema- deteriorated.  Pt reports Lasix is no longer effective.  Swelling is trace today but pt indicates it will often be worse.  No evidence of infxn.  Will switch to Torsemide in hopes of better diuresis.  Encouraged low sodium diet, elevation, and compression socks.  Pt expressed understanding and is in agreement  w/ plan.

## 2019-06-06 NOTE — Assessment & Plan Note (Signed)
Chronic problem.  BP is mildly elevated b/c she ran out of Lisinopril.  Restart Lisinopril and monitor BP.

## 2019-06-06 NOTE — Assessment & Plan Note (Signed)
Check labs and determine if injxns are still needed

## 2019-06-06 NOTE — Assessment & Plan Note (Signed)
Chronic problem.  Tolerating statin w/o difficulty.  Check labs.  Adjust meds prn  

## 2019-06-06 NOTE — Assessment & Plan Note (Signed)
Ongoing issue for pt.  Stressed need for healthy diet and regular exercise.  Check labs to risk stratify. 

## 2019-06-06 NOTE — Assessment & Plan Note (Signed)
Check labs and replete prn. 

## 2019-06-06 NOTE — Patient Instructions (Signed)
Schedule your complete physical in 6 months We'll notify you of your lab results and make any changes if needed We'll call you with your orthopedic appt for the shoulder pain STOP the Lasix (furosemide) and START the Torsemide daily Limit your salt, increase your water, elevate your legs, wear compression socks when up and about Call with any questions or concerns Stay Safe!

## 2019-06-06 NOTE — Assessment & Plan Note (Signed)
Chronic problem.  Check labs.  Adjust meds prn  

## 2019-06-07 LAB — BASIC METABOLIC PANEL
BUN: 15 mg/dL (ref 7–25)
CO2: 22 mmol/L (ref 20–32)
Calcium: 10 mg/dL (ref 8.6–10.4)
Chloride: 105 mmol/L (ref 98–110)
Creat: 0.92 mg/dL (ref 0.60–0.93)
Glucose, Bld: 88 mg/dL (ref 65–99)
Potassium: 4.4 mmol/L (ref 3.5–5.3)
Sodium: 143 mmol/L (ref 135–146)

## 2019-06-07 LAB — CBC WITH DIFFERENTIAL/PLATELET
Absolute Monocytes: 513 cells/uL (ref 200–950)
Basophils Absolute: 49 cells/uL (ref 0–200)
Basophils Relative: 0.9 %
Eosinophils Absolute: 103 cells/uL (ref 15–500)
Eosinophils Relative: 1.9 %
HCT: 46.2 % — ABNORMAL HIGH (ref 35.0–45.0)
Hemoglobin: 15.4 g/dL (ref 11.7–15.5)
Lymphs Abs: 1442 cells/uL (ref 850–3900)
MCH: 31.4 pg (ref 27.0–33.0)
MCHC: 33.3 g/dL (ref 32.0–36.0)
MCV: 94.3 fL (ref 80.0–100.0)
MPV: 12.8 fL — ABNORMAL HIGH (ref 7.5–12.5)
Monocytes Relative: 9.5 %
Neutro Abs: 3294 cells/uL (ref 1500–7800)
Neutrophils Relative %: 61 %
Platelets: 187 10*3/uL (ref 140–400)
RBC: 4.9 10*6/uL (ref 3.80–5.10)
RDW: 12.5 % (ref 11.0–15.0)
Total Lymphocyte: 26.7 %
WBC: 5.4 10*3/uL (ref 3.8–10.8)

## 2019-06-07 LAB — HEPATIC FUNCTION PANEL
AG Ratio: 2.3 (calc) (ref 1.0–2.5)
ALT: 17 U/L (ref 6–29)
AST: 21 U/L (ref 10–35)
Albumin: 4.6 g/dL (ref 3.6–5.1)
Alkaline phosphatase (APISO): 59 U/L (ref 37–153)
Bilirubin, Direct: 0.1 mg/dL (ref 0.0–0.2)
Globulin: 2 g/dL (calc) (ref 1.9–3.7)
Indirect Bilirubin: 0.6 mg/dL (calc) (ref 0.2–1.2)
Total Bilirubin: 0.7 mg/dL (ref 0.2–1.2)
Total Protein: 6.6 g/dL (ref 6.1–8.1)

## 2019-06-07 LAB — LIPID PANEL
Cholesterol: 169 mg/dL (ref <200)
HDL: 60 mg/dL (ref >=50)
LDL Cholesterol (Calc): 81 mg/dL (calc)
Non-HDL Cholesterol (Calc): 109 mg/dL (calc) (ref <130)
Total CHOL/HDL Ratio: 2.8 (calc) (ref <5.0)
Triglycerides: 183 mg/dL — ABNORMAL HIGH (ref <150)

## 2019-06-07 LAB — VITAMIN D 25 HYDROXY (VIT D DEFICIENCY, FRACTURES): Vit D, 25-Hydroxy: 54 ng/mL (ref 30–100)

## 2019-06-07 LAB — VITAMIN B12: Vitamin B-12: 608 pg/mL (ref 200–1100)

## 2019-06-07 LAB — TSH: TSH: 2.39 mIU/L (ref 0.40–4.50)

## 2019-06-09 ENCOUNTER — Other Ambulatory Visit: Payer: Self-pay | Admitting: General Practice

## 2019-06-09 MED ORDER — CYANOCOBALAMIN 1000 MCG/ML IJ SOLN: 1000.0000 ug | INTRAMUSCULAR | 6 refills | Status: DC

## 2019-06-10 ENCOUNTER — Telehealth: Payer: Self-pay | Admitting: *Deleted

## 2019-06-10 DIAGNOSIS — G4733 Obstructive sleep apnea (adult) (pediatric): Secondary | ICD-10-CM | POA: Diagnosis not present

## 2019-06-10 MED ORDER — "BD SAFETYGLIDE SYRINGE/NEEDLE 25G X 1"" 3 ML MISC": 0 refills | Status: AC

## 2019-06-10 NOTE — Telephone Encounter (Signed)
Needles sent for B12 injections.

## 2019-06-16 DIAGNOSIS — R52 Pain, unspecified: Secondary | ICD-10-CM | POA: Diagnosis not present

## 2019-06-16 DIAGNOSIS — M7551 Bursitis of right shoulder: Secondary | ICD-10-CM | POA: Diagnosis not present

## 2019-06-19 DIAGNOSIS — M7551 Bursitis of right shoulder: Secondary | ICD-10-CM | POA: Insufficient documentation

## 2019-07-09 ENCOUNTER — Other Ambulatory Visit: Payer: Self-pay

## 2019-07-09 ENCOUNTER — Ambulatory Visit (INDEPENDENT_AMBULATORY_CARE_PROVIDER_SITE_OTHER): Payer: PPO

## 2019-07-09 DIAGNOSIS — Z1231 Encounter for screening mammogram for malignant neoplasm of breast: Secondary | ICD-10-CM

## 2019-07-10 DIAGNOSIS — G4733 Obstructive sleep apnea (adult) (pediatric): Secondary | ICD-10-CM | POA: Diagnosis not present

## 2019-07-15 ENCOUNTER — Telehealth: Payer: Self-pay | Admitting: Family Medicine

## 2019-07-15 DIAGNOSIS — G8929 Other chronic pain: Secondary | ICD-10-CM

## 2019-07-15 NOTE — Telephone Encounter (Signed)
Last OV 06/06/19 Clonazepam last filled 04/25/19 #30 with 0 Hydrocodone last filled 04/25/19 #60 with 0

## 2019-07-15 NOTE — Telephone Encounter (Signed)
Pt called in asking for refills on the Clonazepam and hydrocodone pt uses [facility] on [address].

## 2019-07-16 MED ORDER — CLONAZEPAM 0.5 MG PO TABS: 0.5000 mg | ORAL_TABLET | Freq: Every evening | ORAL | 1 refills | Status: DC | PRN

## 2019-07-16 MED ORDER — HYDROCODONE-ACETAMINOPHEN 5-325 MG PO TABS: 1.0000 | ORAL_TABLET | Freq: Four times a day (QID) | ORAL | 0 refills | Status: DC | PRN

## 2019-07-16 NOTE — Telephone Encounter (Signed)
Prescriptions sent

## 2019-08-10 DIAGNOSIS — G4733 Obstructive sleep apnea (adult) (pediatric): Secondary | ICD-10-CM | POA: Diagnosis not present

## 2019-08-22 ENCOUNTER — Other Ambulatory Visit: Payer: Self-pay | Admitting: Family Medicine

## 2019-08-25 ENCOUNTER — Encounter: Payer: Self-pay | Admitting: Physician Assistant

## 2019-08-25 ENCOUNTER — Other Ambulatory Visit: Payer: Self-pay

## 2019-08-25 ENCOUNTER — Ambulatory Visit (INDEPENDENT_AMBULATORY_CARE_PROVIDER_SITE_OTHER): Payer: PPO | Admitting: Physician Assistant

## 2019-08-25 DIAGNOSIS — Z20822 Contact with and (suspected) exposure to covid-19: Secondary | ICD-10-CM

## 2019-08-25 MED ORDER — BENZONATATE 100 MG PO CAPS: 100.0000 mg | ORAL_CAPSULE | Freq: Three times a day (TID) | ORAL | 0 refills | Status: DC | PRN

## 2019-08-25 NOTE — Progress Notes (Signed)
I have discussed the procedure for the virtual visit with the patient who has given consent to proceed with assessment and treatment.   Ashley Woods, CMA     

## 2019-08-25 NOTE — Progress Notes (Signed)
Virtual Visit via Video   I connected with patient on 08/25/19 at  2:30 PM EST by a video enabled telemedicine application and verified that I am speaking with the correct person using two identifiers.  Location patient: Home Location provider: [facility] Persons participating in the virtual visit: Patient, Provider, [place] (Patina Moore)  I discussed the limitations of evaluation and management by telemedicine and the availability of in person appointments. The patient expressed understanding and agreed to proceed.  Subjective:   HPI:   Patient presents via [facility] today c/o 3-4 days of fatigue, nasal congestion, chest congestion, cough, aches that began suddenly. Denies loss of taste or smell. Has noted some chest tightness and wheezing, relieved with her albuterol. Denies overt chest pain. Did note one episode of bright blood in clear sputum on coughing. Denies recent travel or sick contact.   ROS:   See pertinent positives and negatives per HPI.  Patient Active Problem List   Diagnosis Date Noted  . Balance problems 04/24/2018  . Chronic back pain 10/09/2017  . Neuropathy of both feet 10/08/2017  . Cataract, nuclear 06/28/2015  . Headache 06/28/2015  . Edema 06/28/2015  . Hypothyroidism 12/10/2014  . Obesity, Class III, BMI 40-49.9 (morbid obesity) ([place]) 04/02/2013  . Neck pain on right side 12/17/2012  . Insomnia 05/08/2012  . Lower abdominal pain 04/02/2012  . General medical examination 03/07/2011  . OSA (obstructive sleep apnea) 02/08/2011  . Toenail deformity 01/03/2011  . Vitamin D deficiency 03/22/2010  . MEMORY LOSS 12/16/2009  . VOCAL CORD DISORDER 06/03/2009  . Carotid stenosis 06/02/2009  . CAROTID BRUIT 04/21/2009  . B12 deficiency 04/08/2009  . Fatigue 04/02/2009  . GERD 03/19/2009  . IRRITABLE BOWEL SYNDROME 03/20/2008  . PERSONAL HX COLONIC POLYPS 03/20/2008  . OVERACTIVE BLADDER 09/27/2007  . Chronic LBP 09/27/2007  .  Hyperlipidemia 01/08/2007  . Depression 01/08/2007  . Essential hypertension 01/08/2007  . ASTHMA 01/08/2007  . ROSACEA 01/08/2007  . COLONOSCOPY, HX OF 01/08/2007    Social History   Tobacco Use  . Smoking status: Former Smoker    Packs/day: 0.40    Years: 45.00    Pack years: 18.00    Types: E-cigarettes, Cigarettes    Quit date: 04/30/2013    Years since quitting: 6.3  . Smokeless tobacco: Never Used  . Tobacco comment: former 1ppd x45 years, 09-13-17 report vaping  Substance Use Topics  . Alcohol use: No    Current Outpatient Medications:  .  albuterol (PROVENTIL HFA;VENTOLIN HFA) 108 (90 Base) MCG/ACT inhaler, Inhale into the lungs., Disp: , Rfl:  .  aspirin 81 MG tablet, , Disp: , Rfl:  .  B Complex Vitamins (VITAMIN B COMPLEX 100) INJ, Inject as directed., Disp: , Rfl:  .  calcium-vitamin D (OSCAL 500/200 D-3) 500-200 MG-UNIT tablet, Take by mouth., Disp: , Rfl:  .  citalopram (CELEXA) 40 MG tablet, Take 1 tablet (40 mg total) by mouth daily., Disp: 90 tablet, Rfl: 1 .  clonazePAM (KLONOPIN) 0.5 MG tablet, Take 1 tablet (0.5 mg total) by mouth at bedtime as needed., Disp: 30 tablet, Rfl: 1 .  cyanocobalamin (,VITAMIN B-12,) 1000 MCG/ML injection, Inject 1 mL (1,000 mcg total) into the muscle every 30 (thirty) days., Disp: 1 mL, Rfl: 6 .  DULoxetine (CYMBALTA) 30 MG capsule, Take 30 mg by mouth at bedtime., Disp: , Rfl:  .  fluticasone (FLONASE) 50 MCG/ACT nasal spray, Place 2 sprays into both nostrils daily., Disp: 16 g, Rfl: 6 .  furosemide (LASIX) 20 MG tablet, Take 2 tablets (40 mg total) by mouth daily., Disp: 180 tablet, Rfl: 1 .  gabapentin (NEURONTIN) 600 MG tablet, Take 1 tablet (600 mg total) by mouth at bedtime., Disp: 30 tablet, Rfl: 3 .  HYDROcodone-acetaminophen (NORCO/VICODIN) 5-325 MG tablet, Take 1 tablet by mouth every 6 (six) hours as needed., Disp: 60 tablet, Rfl: 0 .  levothyroxine (EUTHYROX) 100 MCG tablet, Take 1 tablet (100 mcg total) by mouth daily.,  Disp: 90 tablet, Rfl: 0 .  lisinopril (ZESTRIL) 30 MG tablet, Take 1 tablet by mouth once daily, Disp: 90 tablet, Rfl: 0 .  loperamide (IMODIUM A-D) 2 MG tablet, Take 2 mg by mouth., Disp: , Rfl:  .  nystatin-triamcinolone ointment (MYCOLOG), Apply 1 application topically 2 (two) times daily., Disp: 60 g, Rfl: 3 .  omeprazole (PRILOSEC) 40 MG capsule, Take 1 capsule (40 mg total) by mouth daily., Disp: 90 capsule, Rfl: 1 .  oxybutynin (DITROPAN) 5 MG tablet, Take 1 tablet (5 mg total) by mouth 2 (two) times daily., Disp: 180 tablet, Rfl: 0 .  RESTASIS 0.05 % ophthalmic emulsion, Place 1 drop into both eyes 2 (two) times daily. , Disp: , Rfl:  .  simvastatin (ZOCOR) 40 MG tablet, Take 1 tablet (40 mg total) by mouth at bedtime., Disp: 90 tablet, Rfl: 1 .  SYRINGE-NEEDLE, DISP, 3 ML (BD SAFETYGLIDE SYRINGE/NEEDLE) 25G X 1" 3 ML MISC, Please use one syringe and needle for each injection., Disp: 10 each, Rfl: 0 .  torsemide (DEMADEX) 20 MG tablet, Take 1 tablet (20 mg total) by mouth daily., Disp: 30 tablet, Rfl: 3  Allergies  Allergen Reactions  . Bactrim [Sulfamethoxazole-Trimethoprim] Nausea And Vomiting  . Naproxen Nausea And Vomiting and Shortness Of Breath    Other reaction(s): Respiratory Distress (ALLERGY/intolerance) Other reaction(s): Respiratory Distress (ALLERGY/intolerance)  . Sulfamethoxazole-Trimethoprim Nausea And Vomiting  . Tramadol     Other reaction(s): Vomiting (intolerance)  . Tramadol Hcl     REACTION: nausea  . Zanaflex [Tizanidine] Other (See Comments)    Hallucinations    Objective:   There were no vitals taken for this visit.  No labored breathing.  Speech is clear and coherent with logical content.  Patient is alert and oriented at baseline.   Assessment and Plan:    1. Suspected COVID-19 virus infection Directed patient on how to schedule testing. Quarantine until results are in. Unable to set up for COVID monitoring as she does not use MyChart. She is  to report directly to [place] and new or worsening symptoms. Strict ER precautions reviewed. Start Mucinex, Vitamin D, Vitamin C and Zinc OTC. Rx Tessalon.  Leeanne Rio, PA-C 08/25/2019

## 2019-09-09 ENCOUNTER — Telehealth: Payer: Self-pay | Admitting: Family Medicine

## 2019-09-09 NOTE — Telephone Encounter (Signed)
Pt had a virtual visit with Ashley Woods 1/25 as she was not feeling well. At his recommendation, she tested for COVID and was positive. Thinks test date was 1/28. She is feeling better and wanted your recommendation on getting the COVID vaccine, does she need to wait or can she go ahead and sign up?

## 2019-09-09 NOTE — Telephone Encounter (Signed)
Please advise 

## 2019-09-09 NOTE — Telephone Encounter (Signed)
She needs to wait 45 days after + test to be vaccinated.

## 2019-09-09 NOTE — Telephone Encounter (Signed)
Patient notified of PCP recommendations and is agreement and expresses an understanding.  

## 2019-09-22 DIAGNOSIS — M19011 Primary osteoarthritis, right shoulder: Secondary | ICD-10-CM | POA: Diagnosis not present

## 2019-09-22 DIAGNOSIS — M75111 Incomplete rotator cuff tear or rupture of right shoulder, not specified as traumatic: Secondary | ICD-10-CM | POA: Diagnosis not present

## 2019-09-22 DIAGNOSIS — M7551 Bursitis of right shoulder: Secondary | ICD-10-CM | POA: Diagnosis not present

## 2019-09-24 DIAGNOSIS — M75121 Complete rotator cuff tear or rupture of right shoulder, not specified as traumatic: Secondary | ICD-10-CM | POA: Diagnosis not present

## 2019-09-27 DIAGNOSIS — M75121 Complete rotator cuff tear or rupture of right shoulder, not specified as traumatic: Secondary | ICD-10-CM | POA: Insufficient documentation

## 2019-09-29 HISTORY — PX: SHOULDER SURGERY: SHX246

## 2019-10-07 ENCOUNTER — Telehealth: Payer: Self-pay | Admitting: Family Medicine

## 2019-10-07 NOTE — Telephone Encounter (Signed)
Pt called in asking to speak to Dr. Birdie Riddle or to [place]. She is having rotator cuff surgery on 10/23/19. She states that she was told by her surgeon to talk to Dr. Birdie Riddle about increasing her pain medications for that time. She can be reached at the home # and is aware that Dr. Birdie Riddle and Janett Billow are not in the office today.

## 2019-10-08 NOTE — Telephone Encounter (Signed)
Pain medication for a surgical issue will need to come from the surgeon.

## 2019-10-08 NOTE — Telephone Encounter (Signed)
Please advise 

## 2019-10-08 NOTE — Telephone Encounter (Signed)
Called and advised of PCP recommendations. She stated an understanding and will contact surgeon.   Also pt received COVID vaccination 10/06/19.

## 2019-10-13 DIAGNOSIS — M75121 Complete rotator cuff tear or rupture of right shoulder, not specified as traumatic: Secondary | ICD-10-CM | POA: Diagnosis not present

## 2019-10-13 IMAGING — MG DIGITAL SCREENING BILATERAL MAMMOGRAM WITH TOMO AND CAD
8 series · 8 of 24 positions shown · non-contrast
Comparison: Previous exam(s).

CLINICAL DATA: Screening.

EXAM:
DIGITAL SCREENING BILATERAL MAMMOGRAM WITH TOMO AND CAD

[L CC synth-2D]
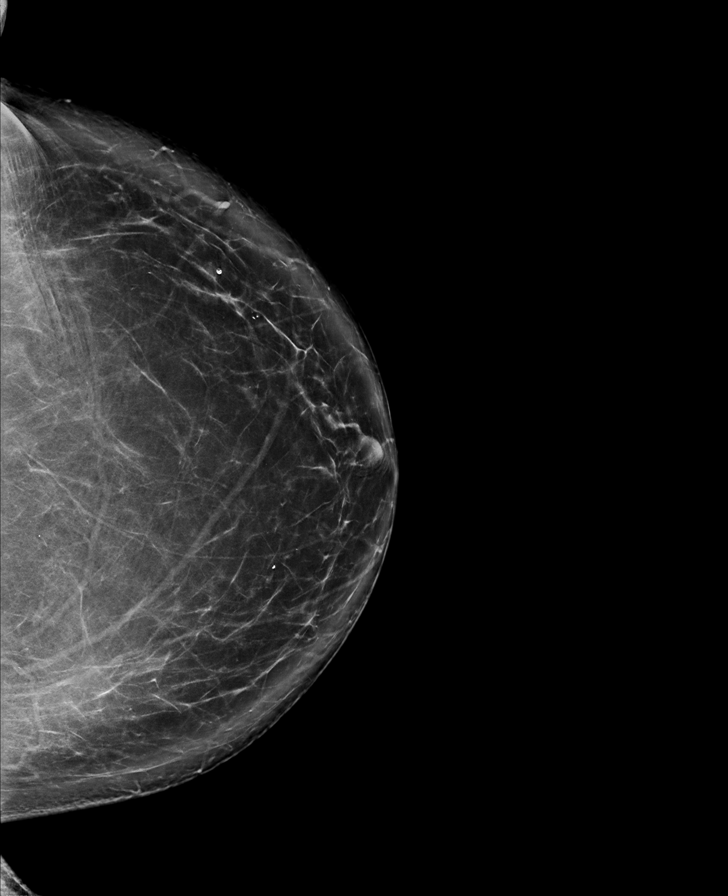

[R MLO synth-2D]
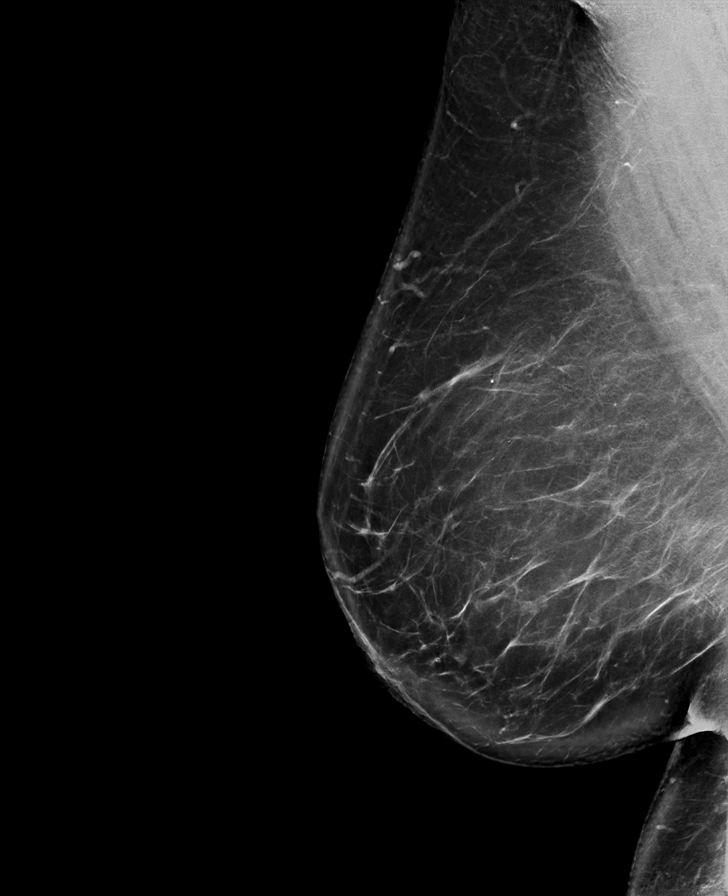

[L MLO synth-2D]
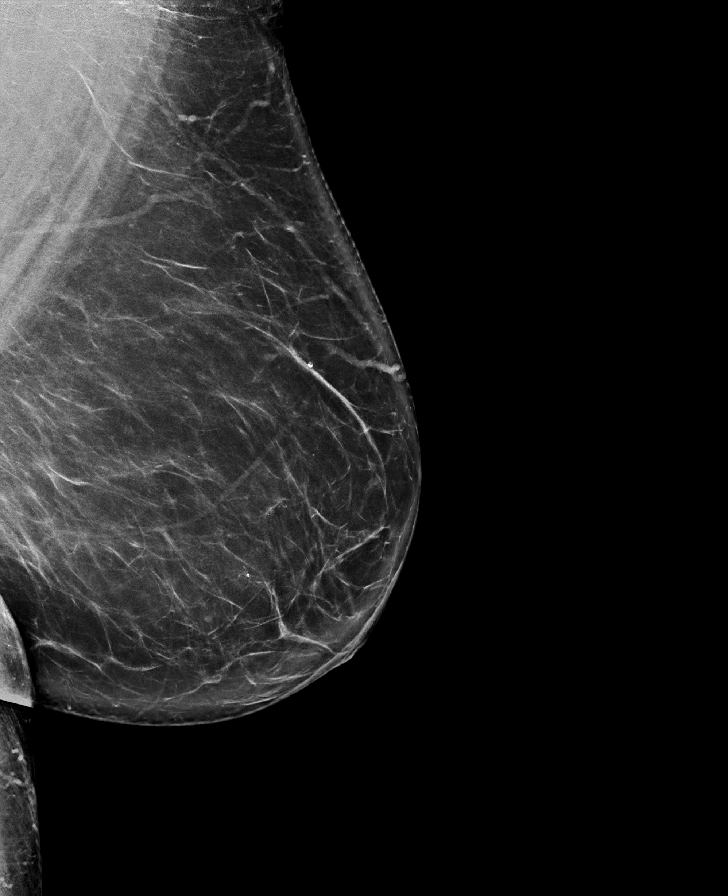

[R CC synth-2D]
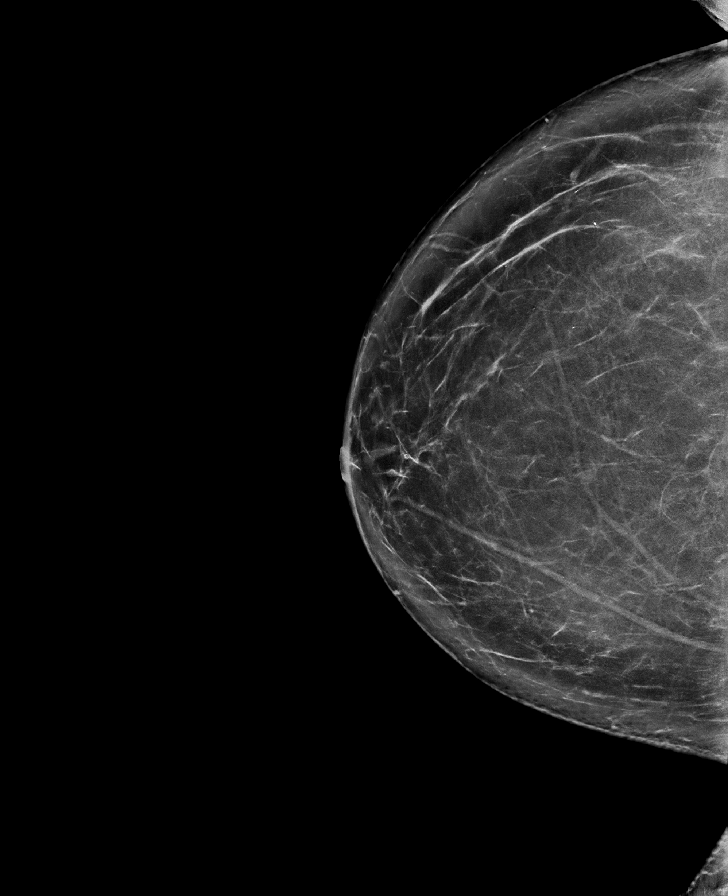

[L CC tomo · tomo slice 49/97.0]
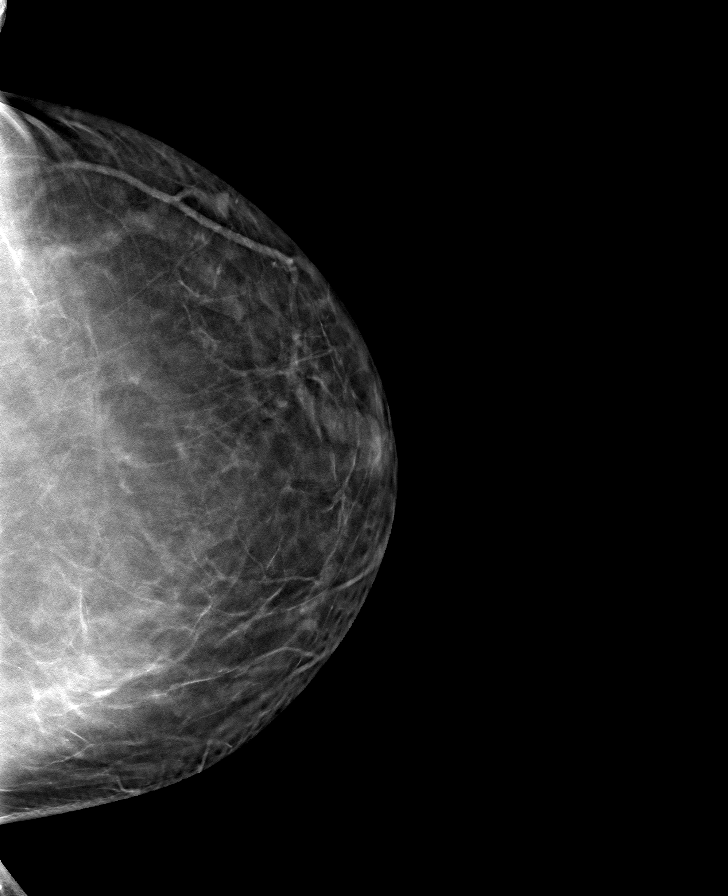

[R MLO tomo · tomo slice 55/110.0]
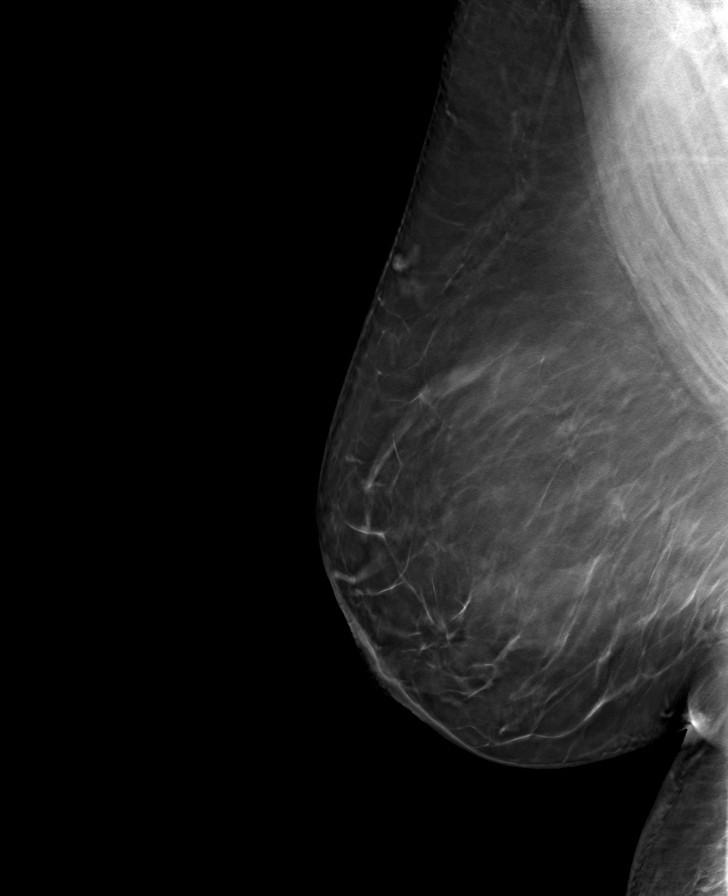

[L MLO tomo · tomo slice 50/99.0]
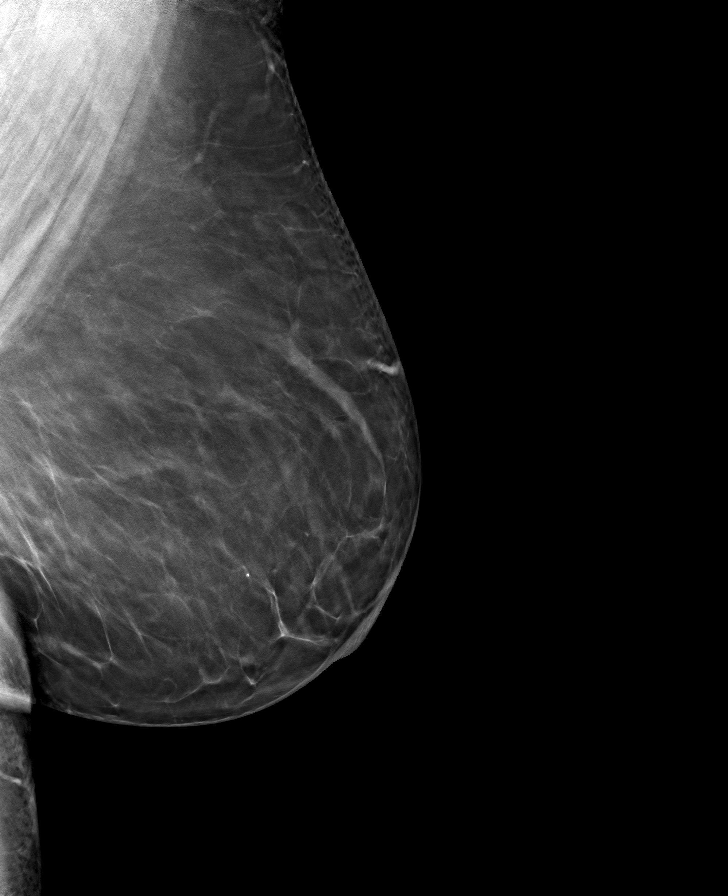

[R CC tomo · tomo slice 47/92.0]
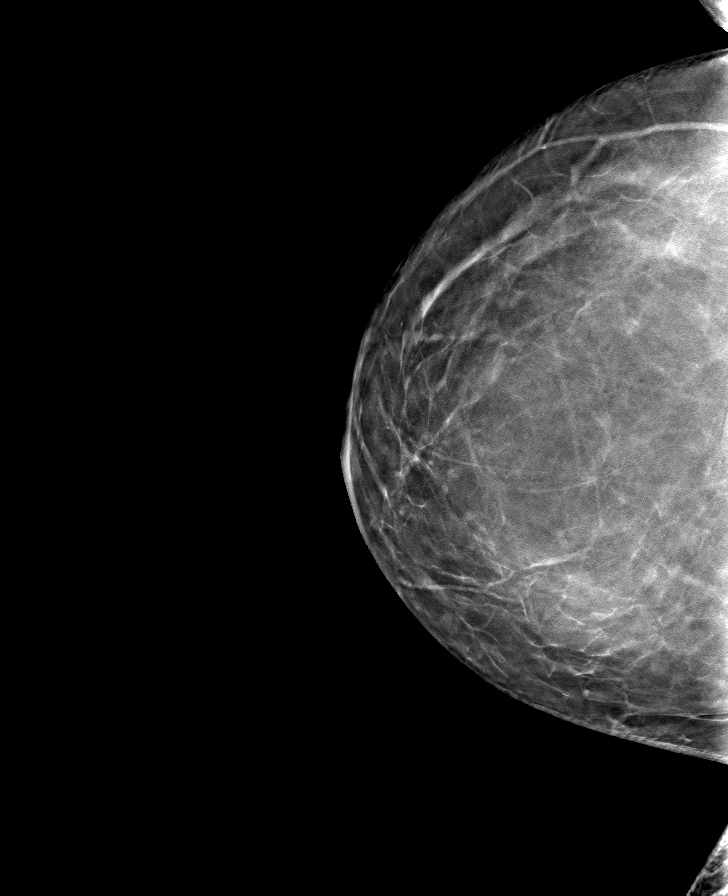

[8 of 24 positions shown; findings below may reference images not displayed]

ACR Breast Density Category b: There are scattered areas of
fibroglandular density.
FINDINGS: There are no findings suspicious for malignancy. Images were
processed with CAD.
IMPRESSION: No mammographic evidence of malignancy. A result letter of this
screening mammogram will be mailed directly to the patient.

RECOMMENDATION:
Screening mammogram in one year. (Code:[id number])

BI-RADS CATEGORY  1: Negative.

## 2019-10-23 DIAGNOSIS — E119 Type 2 diabetes mellitus without complications: Secondary | ICD-10-CM | POA: Diagnosis not present

## 2019-10-23 DIAGNOSIS — K219 Gastro-esophageal reflux disease without esophagitis: Secondary | ICD-10-CM | POA: Diagnosis not present

## 2019-10-23 DIAGNOSIS — M25811 Other specified joint disorders, right shoulder: Secondary | ICD-10-CM | POA: Diagnosis not present

## 2019-10-23 DIAGNOSIS — G8918 Other acute postprocedural pain: Secondary | ICD-10-CM | POA: Diagnosis not present

## 2019-10-23 DIAGNOSIS — Z8673 Personal history of transient ischemic attack (TIA), and cerebral infarction without residual deficits: Secondary | ICD-10-CM | POA: Diagnosis not present

## 2019-10-23 DIAGNOSIS — F17290 Nicotine dependence, other tobacco product, uncomplicated: Secondary | ICD-10-CM | POA: Diagnosis not present

## 2019-10-23 DIAGNOSIS — M7551 Bursitis of right shoulder: Secondary | ICD-10-CM | POA: Diagnosis not present

## 2019-10-23 DIAGNOSIS — Z882 Allergy status to sulfonamides status: Secondary | ICD-10-CM | POA: Diagnosis not present

## 2019-10-23 DIAGNOSIS — Z7982 Long term (current) use of aspirin: Secondary | ICD-10-CM | POA: Diagnosis not present

## 2019-10-23 DIAGNOSIS — G43909 Migraine, unspecified, not intractable, without status migrainosus: Secondary | ICD-10-CM | POA: Diagnosis not present

## 2019-10-23 DIAGNOSIS — M75121 Complete rotator cuff tear or rupture of right shoulder, not specified as traumatic: Secondary | ICD-10-CM | POA: Diagnosis not present

## 2019-10-23 DIAGNOSIS — G4730 Sleep apnea, unspecified: Secondary | ICD-10-CM | POA: Diagnosis not present

## 2019-10-23 DIAGNOSIS — M75101 Unspecified rotator cuff tear or rupture of right shoulder, not specified as traumatic: Secondary | ICD-10-CM | POA: Diagnosis not present

## 2019-10-23 DIAGNOSIS — M7541 Impingement syndrome of right shoulder: Secondary | ICD-10-CM | POA: Diagnosis not present

## 2019-10-23 DIAGNOSIS — M19011 Primary osteoarthritis, right shoulder: Secondary | ICD-10-CM | POA: Diagnosis not present

## 2019-10-23 DIAGNOSIS — Z79899 Other long term (current) drug therapy: Secondary | ICD-10-CM | POA: Diagnosis not present

## 2019-10-23 DIAGNOSIS — Z791 Long term (current) use of non-steroidal anti-inflammatories (NSAID): Secondary | ICD-10-CM | POA: Diagnosis not present

## 2019-10-23 DIAGNOSIS — Z885 Allergy status to narcotic agent status: Secondary | ICD-10-CM | POA: Diagnosis not present

## 2019-10-23 DIAGNOSIS — Z886 Allergy status to analgesic agent status: Secondary | ICD-10-CM | POA: Diagnosis not present

## 2019-10-23 DIAGNOSIS — J45909 Unspecified asthma, uncomplicated: Secondary | ICD-10-CM | POA: Diagnosis not present

## 2019-10-23 DIAGNOSIS — Z9889 Other specified postprocedural states: Secondary | ICD-10-CM | POA: Insufficient documentation

## 2019-10-23 DIAGNOSIS — Z79890 Hormone replacement therapy (postmenopausal): Secondary | ICD-10-CM | POA: Diagnosis not present

## 2019-10-23 DIAGNOSIS — Z79891 Long term (current) use of opiate analgesic: Secondary | ICD-10-CM | POA: Diagnosis not present

## 2019-10-23 DIAGNOSIS — E079 Disorder of thyroid, unspecified: Secondary | ICD-10-CM | POA: Diagnosis not present

## 2019-10-23 DIAGNOSIS — Z5333 Arthroscopic surgical procedure converted to open procedure: Secondary | ICD-10-CM | POA: Diagnosis not present

## 2019-10-27 DIAGNOSIS — G4733 Obstructive sleep apnea (adult) (pediatric): Secondary | ICD-10-CM | POA: Diagnosis not present

## 2019-10-28 ENCOUNTER — Telehealth: Payer: Self-pay | Admitting: Family Medicine

## 2019-10-28 NOTE — Progress Notes (Signed)
  Chronic Care Management   Outreach Note  10/28/2019 Name: Ashley Woods MRN: [id number] DOB: [birth year]  Referred by: Midge Minium, MD Reason for referral : No chief complaint on file.   An unsuccessful telephone outreach was attempted today. The patient was referred to the pharmacist for assistance with care management and care coordination.   Follow Up Plan:   Earney [place] Upstream Scheduler

## 2019-11-03 DIAGNOSIS — Z4789 Encounter for other orthopedic aftercare: Secondary | ICD-10-CM | POA: Diagnosis not present

## 2019-11-04 DIAGNOSIS — M75121 Complete rotator cuff tear or rupture of right shoulder, not specified as traumatic: Secondary | ICD-10-CM | POA: Diagnosis not present

## 2019-11-04 DIAGNOSIS — Z9889 Other specified postprocedural states: Secondary | ICD-10-CM | POA: Diagnosis not present

## 2019-11-04 DIAGNOSIS — M25511 Pain in right shoulder: Secondary | ICD-10-CM | POA: Diagnosis not present

## 2019-11-04 DIAGNOSIS — M25611 Stiffness of right shoulder, not elsewhere classified: Secondary | ICD-10-CM | POA: Diagnosis not present

## 2019-11-04 DIAGNOSIS — R29898 Other symptoms and signs involving the musculoskeletal system: Secondary | ICD-10-CM | POA: Diagnosis not present

## 2019-11-04 DIAGNOSIS — Z4789 Encounter for other orthopedic aftercare: Secondary | ICD-10-CM | POA: Diagnosis not present

## 2019-11-04 DIAGNOSIS — M6281 Muscle weakness (generalized): Secondary | ICD-10-CM | POA: Diagnosis not present

## 2019-11-05 ENCOUNTER — Telehealth: Payer: Self-pay | Admitting: Family Medicine

## 2019-11-05 NOTE — Progress Notes (Signed)
  Chronic Care Management   Outreach Note  11/05/2019 Name: Ashley Woods MRN: [id number] DOB: 1945/10/06  Referred by: Midge Minium, MD Reason for referral : No chief complaint on file.   An unsuccessful telephone outreach was attempted today. The patient was referred to the pharmacist for assistance with care management and care coordination.   Follow Up Plan:   Earney Hamburg Upstream Scheduler

## 2019-11-10 DIAGNOSIS — M6281 Muscle weakness (generalized): Secondary | ICD-10-CM | POA: Diagnosis not present

## 2019-11-10 DIAGNOSIS — M25511 Pain in right shoulder: Secondary | ICD-10-CM | POA: Diagnosis not present

## 2019-11-10 DIAGNOSIS — M75121 Complete rotator cuff tear or rupture of right shoulder, not specified as traumatic: Secondary | ICD-10-CM | POA: Diagnosis not present

## 2019-11-10 DIAGNOSIS — Z9889 Other specified postprocedural states: Secondary | ICD-10-CM | POA: Diagnosis not present

## 2019-11-10 DIAGNOSIS — M25611 Stiffness of right shoulder, not elsewhere classified: Secondary | ICD-10-CM | POA: Diagnosis not present

## 2019-11-10 DIAGNOSIS — Z4789 Encounter for other orthopedic aftercare: Secondary | ICD-10-CM | POA: Diagnosis not present

## 2019-11-10 DIAGNOSIS — R29898 Other symptoms and signs involving the musculoskeletal system: Secondary | ICD-10-CM | POA: Diagnosis not present

## 2019-11-11 ENCOUNTER — Other Ambulatory Visit: Payer: Self-pay | Admitting: Family Medicine

## 2019-11-12 DIAGNOSIS — M25511 Pain in right shoulder: Secondary | ICD-10-CM | POA: Diagnosis not present

## 2019-11-12 DIAGNOSIS — M25611 Stiffness of right shoulder, not elsewhere classified: Secondary | ICD-10-CM | POA: Diagnosis not present

## 2019-11-12 DIAGNOSIS — M6281 Muscle weakness (generalized): Secondary | ICD-10-CM | POA: Diagnosis not present

## 2019-11-12 DIAGNOSIS — R29898 Other symptoms and signs involving the musculoskeletal system: Secondary | ICD-10-CM | POA: Diagnosis not present

## 2019-11-12 DIAGNOSIS — Z9889 Other specified postprocedural states: Secondary | ICD-10-CM | POA: Diagnosis not present

## 2019-11-12 DIAGNOSIS — M75121 Complete rotator cuff tear or rupture of right shoulder, not specified as traumatic: Secondary | ICD-10-CM | POA: Diagnosis not present

## 2019-11-12 DIAGNOSIS — Z4789 Encounter for other orthopedic aftercare: Secondary | ICD-10-CM | POA: Diagnosis not present

## 2019-11-19 DIAGNOSIS — Z4789 Encounter for other orthopedic aftercare: Secondary | ICD-10-CM | POA: Diagnosis not present

## 2019-11-19 DIAGNOSIS — M75121 Complete rotator cuff tear or rupture of right shoulder, not specified as traumatic: Secondary | ICD-10-CM | POA: Diagnosis not present

## 2019-11-19 DIAGNOSIS — Z9889 Other specified postprocedural states: Secondary | ICD-10-CM | POA: Diagnosis not present

## 2019-11-19 DIAGNOSIS — R29898 Other symptoms and signs involving the musculoskeletal system: Secondary | ICD-10-CM | POA: Diagnosis not present

## 2019-11-19 DIAGNOSIS — M25511 Pain in right shoulder: Secondary | ICD-10-CM | POA: Diagnosis not present

## 2019-11-19 DIAGNOSIS — M6281 Muscle weakness (generalized): Secondary | ICD-10-CM | POA: Diagnosis not present

## 2019-11-19 DIAGNOSIS — M25611 Stiffness of right shoulder, not elsewhere classified: Secondary | ICD-10-CM | POA: Diagnosis not present

## 2019-12-01 DIAGNOSIS — Z981 Arthrodesis status: Secondary | ICD-10-CM | POA: Diagnosis not present

## 2019-12-01 DIAGNOSIS — K219 Gastro-esophageal reflux disease without esophagitis: Secondary | ICD-10-CM | POA: Diagnosis not present

## 2019-12-01 DIAGNOSIS — Z4789 Encounter for other orthopedic aftercare: Secondary | ICD-10-CM | POA: Diagnosis not present

## 2019-12-01 DIAGNOSIS — Z8673 Personal history of transient ischemic attack (TIA), and cerebral infarction without residual deficits: Secondary | ICD-10-CM | POA: Diagnosis not present

## 2019-12-01 DIAGNOSIS — G479 Sleep disorder, unspecified: Secondary | ICD-10-CM | POA: Diagnosis not present

## 2019-12-01 DIAGNOSIS — M25511 Pain in right shoulder: Secondary | ICD-10-CM | POA: Diagnosis not present

## 2019-12-01 DIAGNOSIS — I10 Essential (primary) hypertension: Secondary | ICD-10-CM | POA: Diagnosis not present

## 2019-12-01 DIAGNOSIS — R531 Weakness: Secondary | ICD-10-CM | POA: Diagnosis not present

## 2019-12-01 DIAGNOSIS — R2689 Other abnormalities of gait and mobility: Secondary | ICD-10-CM | POA: Diagnosis not present

## 2019-12-01 DIAGNOSIS — R634 Abnormal weight loss: Secondary | ICD-10-CM | POA: Diagnosis not present

## 2019-12-03 DIAGNOSIS — Z981 Arthrodesis status: Secondary | ICD-10-CM | POA: Diagnosis not present

## 2019-12-03 DIAGNOSIS — Z8673 Personal history of transient ischemic attack (TIA), and cerebral infarction without residual deficits: Secondary | ICD-10-CM | POA: Diagnosis not present

## 2019-12-03 DIAGNOSIS — R2689 Other abnormalities of gait and mobility: Secondary | ICD-10-CM | POA: Diagnosis not present

## 2019-12-03 DIAGNOSIS — R531 Weakness: Secondary | ICD-10-CM | POA: Diagnosis not present

## 2019-12-03 DIAGNOSIS — G479 Sleep disorder, unspecified: Secondary | ICD-10-CM | POA: Diagnosis not present

## 2019-12-03 DIAGNOSIS — K219 Gastro-esophageal reflux disease without esophagitis: Secondary | ICD-10-CM | POA: Diagnosis not present

## 2019-12-03 DIAGNOSIS — Z4789 Encounter for other orthopedic aftercare: Secondary | ICD-10-CM | POA: Diagnosis not present

## 2019-12-03 DIAGNOSIS — I10 Essential (primary) hypertension: Secondary | ICD-10-CM | POA: Diagnosis not present

## 2019-12-03 DIAGNOSIS — R634 Abnormal weight loss: Secondary | ICD-10-CM | POA: Diagnosis not present

## 2019-12-03 DIAGNOSIS — M25511 Pain in right shoulder: Secondary | ICD-10-CM | POA: Diagnosis not present

## 2019-12-08 DIAGNOSIS — R2689 Other abnormalities of gait and mobility: Secondary | ICD-10-CM | POA: Diagnosis not present

## 2019-12-08 DIAGNOSIS — K219 Gastro-esophageal reflux disease without esophagitis: Secondary | ICD-10-CM | POA: Diagnosis not present

## 2019-12-08 DIAGNOSIS — R634 Abnormal weight loss: Secondary | ICD-10-CM | POA: Diagnosis not present

## 2019-12-08 DIAGNOSIS — M25511 Pain in right shoulder: Secondary | ICD-10-CM | POA: Diagnosis not present

## 2019-12-08 DIAGNOSIS — Z981 Arthrodesis status: Secondary | ICD-10-CM | POA: Diagnosis not present

## 2019-12-08 DIAGNOSIS — G479 Sleep disorder, unspecified: Secondary | ICD-10-CM | POA: Diagnosis not present

## 2019-12-08 DIAGNOSIS — R531 Weakness: Secondary | ICD-10-CM | POA: Diagnosis not present

## 2019-12-08 DIAGNOSIS — I10 Essential (primary) hypertension: Secondary | ICD-10-CM | POA: Diagnosis not present

## 2019-12-08 DIAGNOSIS — Z4789 Encounter for other orthopedic aftercare: Secondary | ICD-10-CM | POA: Diagnosis not present

## 2019-12-08 DIAGNOSIS — Z8673 Personal history of transient ischemic attack (TIA), and cerebral infarction without residual deficits: Secondary | ICD-10-CM | POA: Diagnosis not present

## 2019-12-10 DIAGNOSIS — R2689 Other abnormalities of gait and mobility: Secondary | ICD-10-CM | POA: Diagnosis not present

## 2019-12-10 DIAGNOSIS — R634 Abnormal weight loss: Secondary | ICD-10-CM | POA: Diagnosis not present

## 2019-12-10 DIAGNOSIS — G479 Sleep disorder, unspecified: Secondary | ICD-10-CM | POA: Diagnosis not present

## 2019-12-10 DIAGNOSIS — Z8673 Personal history of transient ischemic attack (TIA), and cerebral infarction without residual deficits: Secondary | ICD-10-CM | POA: Diagnosis not present

## 2019-12-10 DIAGNOSIS — Z981 Arthrodesis status: Secondary | ICD-10-CM | POA: Diagnosis not present

## 2019-12-10 DIAGNOSIS — Z4789 Encounter for other orthopedic aftercare: Secondary | ICD-10-CM | POA: Diagnosis not present

## 2019-12-10 DIAGNOSIS — I10 Essential (primary) hypertension: Secondary | ICD-10-CM | POA: Diagnosis not present

## 2019-12-10 DIAGNOSIS — K219 Gastro-esophageal reflux disease without esophagitis: Secondary | ICD-10-CM | POA: Diagnosis not present

## 2019-12-10 DIAGNOSIS — R531 Weakness: Secondary | ICD-10-CM | POA: Diagnosis not present

## 2019-12-10 DIAGNOSIS — M25511 Pain in right shoulder: Secondary | ICD-10-CM | POA: Diagnosis not present

## 2019-12-15 ENCOUNTER — Other Ambulatory Visit: Payer: Self-pay | Admitting: Family Medicine

## 2019-12-17 DIAGNOSIS — K219 Gastro-esophageal reflux disease without esophagitis: Secondary | ICD-10-CM | POA: Diagnosis not present

## 2019-12-17 DIAGNOSIS — R2689 Other abnormalities of gait and mobility: Secondary | ICD-10-CM | POA: Diagnosis not present

## 2019-12-17 DIAGNOSIS — I10 Essential (primary) hypertension: Secondary | ICD-10-CM | POA: Diagnosis not present

## 2019-12-17 DIAGNOSIS — Z981 Arthrodesis status: Secondary | ICD-10-CM | POA: Diagnosis not present

## 2019-12-17 DIAGNOSIS — R531 Weakness: Secondary | ICD-10-CM | POA: Diagnosis not present

## 2019-12-17 DIAGNOSIS — M25511 Pain in right shoulder: Secondary | ICD-10-CM | POA: Diagnosis not present

## 2019-12-17 DIAGNOSIS — R634 Abnormal weight loss: Secondary | ICD-10-CM | POA: Diagnosis not present

## 2019-12-17 DIAGNOSIS — Z8673 Personal history of transient ischemic attack (TIA), and cerebral infarction without residual deficits: Secondary | ICD-10-CM | POA: Diagnosis not present

## 2019-12-17 DIAGNOSIS — Z4789 Encounter for other orthopedic aftercare: Secondary | ICD-10-CM | POA: Diagnosis not present

## 2019-12-17 DIAGNOSIS — G479 Sleep disorder, unspecified: Secondary | ICD-10-CM | POA: Diagnosis not present

## 2019-12-19 DIAGNOSIS — I10 Essential (primary) hypertension: Secondary | ICD-10-CM | POA: Diagnosis not present

## 2019-12-19 DIAGNOSIS — Z981 Arthrodesis status: Secondary | ICD-10-CM | POA: Diagnosis not present

## 2019-12-19 DIAGNOSIS — G479 Sleep disorder, unspecified: Secondary | ICD-10-CM | POA: Diagnosis not present

## 2019-12-19 DIAGNOSIS — K219 Gastro-esophageal reflux disease without esophagitis: Secondary | ICD-10-CM | POA: Diagnosis not present

## 2019-12-19 DIAGNOSIS — Z8673 Personal history of transient ischemic attack (TIA), and cerebral infarction without residual deficits: Secondary | ICD-10-CM | POA: Diagnosis not present

## 2019-12-19 DIAGNOSIS — Z4789 Encounter for other orthopedic aftercare: Secondary | ICD-10-CM | POA: Diagnosis not present

## 2019-12-19 DIAGNOSIS — R531 Weakness: Secondary | ICD-10-CM | POA: Diagnosis not present

## 2019-12-19 DIAGNOSIS — R634 Abnormal weight loss: Secondary | ICD-10-CM | POA: Diagnosis not present

## 2019-12-19 DIAGNOSIS — M25511 Pain in right shoulder: Secondary | ICD-10-CM | POA: Diagnosis not present

## 2019-12-19 DIAGNOSIS — R2689 Other abnormalities of gait and mobility: Secondary | ICD-10-CM | POA: Diagnosis not present

## 2019-12-22 DIAGNOSIS — G479 Sleep disorder, unspecified: Secondary | ICD-10-CM | POA: Diagnosis not present

## 2019-12-22 DIAGNOSIS — R634 Abnormal weight loss: Secondary | ICD-10-CM | POA: Diagnosis not present

## 2019-12-22 DIAGNOSIS — I10 Essential (primary) hypertension: Secondary | ICD-10-CM | POA: Diagnosis not present

## 2019-12-22 DIAGNOSIS — K219 Gastro-esophageal reflux disease without esophagitis: Secondary | ICD-10-CM | POA: Diagnosis not present

## 2019-12-22 DIAGNOSIS — R531 Weakness: Secondary | ICD-10-CM | POA: Diagnosis not present

## 2019-12-22 DIAGNOSIS — Z981 Arthrodesis status: Secondary | ICD-10-CM | POA: Diagnosis not present

## 2019-12-22 DIAGNOSIS — Z4789 Encounter for other orthopedic aftercare: Secondary | ICD-10-CM | POA: Diagnosis not present

## 2019-12-22 DIAGNOSIS — M25511 Pain in right shoulder: Secondary | ICD-10-CM | POA: Diagnosis not present

## 2019-12-22 DIAGNOSIS — Z8673 Personal history of transient ischemic attack (TIA), and cerebral infarction without residual deficits: Secondary | ICD-10-CM | POA: Diagnosis not present

## 2019-12-22 DIAGNOSIS — R2689 Other abnormalities of gait and mobility: Secondary | ICD-10-CM | POA: Diagnosis not present

## 2019-12-24 DIAGNOSIS — R531 Weakness: Secondary | ICD-10-CM | POA: Diagnosis not present

## 2019-12-24 DIAGNOSIS — Z8673 Personal history of transient ischemic attack (TIA), and cerebral infarction without residual deficits: Secondary | ICD-10-CM | POA: Diagnosis not present

## 2019-12-24 DIAGNOSIS — Z981 Arthrodesis status: Secondary | ICD-10-CM | POA: Diagnosis not present

## 2019-12-24 DIAGNOSIS — R634 Abnormal weight loss: Secondary | ICD-10-CM | POA: Diagnosis not present

## 2019-12-24 DIAGNOSIS — G479 Sleep disorder, unspecified: Secondary | ICD-10-CM | POA: Diagnosis not present

## 2019-12-24 DIAGNOSIS — R2689 Other abnormalities of gait and mobility: Secondary | ICD-10-CM | POA: Diagnosis not present

## 2019-12-24 DIAGNOSIS — K219 Gastro-esophageal reflux disease without esophagitis: Secondary | ICD-10-CM | POA: Diagnosis not present

## 2019-12-24 DIAGNOSIS — M25511 Pain in right shoulder: Secondary | ICD-10-CM | POA: Diagnosis not present

## 2019-12-24 DIAGNOSIS — I10 Essential (primary) hypertension: Secondary | ICD-10-CM | POA: Diagnosis not present

## 2019-12-24 DIAGNOSIS — Z4789 Encounter for other orthopedic aftercare: Secondary | ICD-10-CM | POA: Diagnosis not present

## 2020-01-06 ENCOUNTER — Encounter (INDEPENDENT_AMBULATORY_CARE_PROVIDER_SITE_OTHER): Payer: Self-pay

## 2020-01-06 ENCOUNTER — Encounter: Payer: Self-pay | Admitting: Physician Assistant

## 2020-01-06 ENCOUNTER — Other Ambulatory Visit: Payer: Self-pay

## 2020-01-06 ENCOUNTER — Ambulatory Visit (INDEPENDENT_AMBULATORY_CARE_PROVIDER_SITE_OTHER): Payer: PPO | Admitting: Physician Assistant

## 2020-01-06 VITALS — BP 148/86 | HR 68 | Temp 98.3°F | Ht 64.0 in | Wt 237.6 lb

## 2020-01-06 DIAGNOSIS — R300 Dysuria: Secondary | ICD-10-CM | POA: Diagnosis not present

## 2020-01-06 LAB — POCT URINALYSIS DIPSTICK
Bilirubin, UA: NEGATIVE
Blood, UA: NEGATIVE
Glucose, UA: NEGATIVE
Ketones, UA: NEGATIVE
Nitrite, UA: NEGATIVE
Protein, UA: NEGATIVE
Spec Grav, UA: 1.015 (ref 1.010–1.025)
Urobilinogen, UA: 0.2 E.U./dL
pH, UA: 7 (ref 5.0–8.0)

## 2020-01-06 MED ORDER — CEPHALEXIN 500 MG PO CAPS: 500.0000 mg | ORAL_CAPSULE | Freq: Two times a day (BID) | ORAL | 0 refills | Status: AC

## 2020-01-06 NOTE — Patient Instructions (Signed)
Your symptoms are consistent with a bladder infection, also called acute cystitis. Please take your antibiotic (Keflex) as directed until all pills are gone.  Stay very well hydrated.  Consider a daily probiotic (Align, Culturelle, or Activia) to help prevent stomach upset caused by the antibiotic.  Taking a probiotic daily may also help prevent recurrent UTIs.  Also consider taking AZO (Phenazopyridine) tablets to help decrease pain with urination.  I will call you with your urine testing results.  We will change antibiotics if indicated.  Call or return to clinic if symptoms are not resolved by completion of antibiotic.   Urinary Tract Infection A urinary tract infection (UTI) can occur any place along the urinary tract. The tract includes the kidneys, ureters, bladder, and urethra. A type of germ called bacteria often causes a UTI. UTIs are often helped with antibiotic medicine.  HOME CARE   If given, take antibiotics as told by your doctor. Finish them even if you start to feel better.  Drink enough fluids to keep your pee (urine) clear or pale yellow.  Avoid tea, drinks with caffeine, and bubbly (carbonated) drinks.  Pee often. Avoid holding your pee in for a long time.  Pee before and after having sex (intercourse).  Wipe from front to back after you poop (bowel movement) if you are a woman. Use each tissue only once. GET HELP RIGHT AWAY IF:   You have back pain.  You have lower belly (abdominal) pain.  You have chills.  You feel sick to your stomach (nauseous).  You throw up (vomit).  Your burning or discomfort with peeing does not go away.  You have a fever.  Your symptoms are not better in 3 days. MAKE SURE YOU:   Understand these instructions.  Will watch your condition.  Will get help right away if you are not doing well or get worse. Document Released: 01/03/2008 Document Revised: 04/10/2012 Document Reviewed: 02/15/2012 Exit[employer] Patient Information 2015  [employer]. This information is not intended to replace advice given to you by your health care provider. Make sure you discuss any questions you have with your health care provider.   

## 2020-01-06 NOTE — Progress Notes (Signed)
Patient presents to clinic today c/o 3 days of dysuria, foul-smelling urine, cloudy urine. Notes urgency, frequency and back pain. Denies vaginal symptoms. Notes some chills this AM with mild nausea. Denies fever. Notes fatigue. Denies flank pain. Has + history of UTI.   Past Medical History:  Diagnosis Date   Asthma    Cataract    Colon polyp    adenomatous   Colon polyps    COPD (chronic obstructive pulmonary disease) (HCC)    Depression    Diverticulosis    GERD (gastroesophageal reflux disease)    Hemorrhoids    Hyperlipidemia    Hypertension    IBS (irritable bowel syndrome)    Macular degeneration    Dr. Hoyle Sauer   Overactive bladder    Right arm fracture    2018   Rosacea    Sleep apnea    uses cpap   Stroke [facility])    TIA      Thyroid disease     Current Outpatient Medications on File Prior to Visit  Medication Sig Dispense Refill   albuterol (PROVENTIL HFA;VENTOLIN HFA) 108 (90 Base) MCG/ACT inhaler Inhale into the lungs.     aspirin 81 MG tablet      B Complex Vitamins (VITAMIN B COMPLEX 100) INJ Inject as directed.     benzonatate (TESSALON) 100 MG capsule Take 1 capsule (100 mg total) by mouth 3 (three) times daily as needed. 30 capsule 0   calcium-vitamin D (OSCAL 500/200 D-3) 500-200 MG-UNIT tablet Take by mouth.     citalopram (CELEXA) 40 MG tablet Take 1 tablet (40 mg total) by mouth daily. 90 tablet 1   clonazePAM (KLONOPIN) 0.5 MG tablet Take 1 tablet (0.5 mg total) by mouth at bedtime as needed. 30 tablet 1   cyanocobalamin (,VITAMIN B-12,) 1000 MCG/ML injection Inject 1 mL (1,000 mcg total) into the muscle every 30 (thirty) days. 1 mL 6   DULoxetine (CYMBALTA) 30 MG capsule Take 30 mg by mouth at bedtime.     fluticasone (FLONASE) 50 MCG/ACT nasal spray Place 2 sprays into both nostrils daily. 16 g 6   furosemide (LASIX) 20 MG tablet Take 2 tablets (40 mg total) by mouth daily. 180 tablet 1   gabapentin (NEURONTIN) 600  MG tablet Take 1 tablet (600 mg total) by mouth at bedtime. 30 tablet 3   HYDROcodone-acetaminophen (NORCO/VICODIN) 5-325 MG tablet Take 1 tablet by mouth every 6 (six) hours as needed. 60 tablet 0   levothyroxine (SYNTHROID) 100 MCG tablet Take 1 tablet by mouth once daily 90 tablet 0   lisinopril (ZESTRIL) 30 MG tablet Take 1 tablet by mouth once daily 90 tablet 0   loperamide (IMODIUM A-D) 2 MG tablet Take 2 mg by mouth.     nystatin-triamcinolone ointment (MYCOLOG) Apply 1 application topically 2 (two) times daily. 60 g 3   omeprazole (PRILOSEC) 40 MG capsule Take 1 capsule (40 mg total) by mouth daily. 90 capsule 1   oxybutynin (DITROPAN) 5 MG tablet Take 1 tablet (5 mg total) by mouth 2 (two) times daily. 180 tablet 0   RESTASIS 0.05 % ophthalmic emulsion Place 1 drop into both eyes 2 (two) times daily.      simvastatin (ZOCOR) 40 MG tablet TAKE 1 TABLET BY MOUTH AT BEDTIME 90 tablet 0   SYRINGE-NEEDLE, DISP, 3 ML (BD SAFETYGLIDE SYRINGE/NEEDLE) 25G X 1" 3 ML MISC Please use one syringe and needle for each injection. 10 each 0   torsemide (DEMADEX) 20  MG tablet Take 1 tablet (20 mg total) by mouth daily. 30 tablet 3   No current facility-administered medications on file prior to visit.    Allergies  Allergen Reactions   Bactrim [Sulfamethoxazole-Trimethoprim] Nausea And Vomiting   Naproxen Nausea And Vomiting and Shortness Of Breath    Other reaction(s): Respiratory Distress (ALLERGY/intolerance) Other reaction(s): Respiratory Distress (ALLERGY/intolerance)   Sulfamethoxazole-Trimethoprim Nausea And Vomiting   Tramadol     Other reaction(s): Vomiting (intolerance)   Tramadol Hcl     REACTION: nausea   Zanaflex [Tizanidine] Other (See Comments)    Hallucinations    Family History  Problem Relation Age of Onset   Heart disease Daughter    Delorse Limber White syndrome Daughter    Colon cancer Neg Hx    Inflammatory bowel disease Neg Hx    Esophageal  cancer Neg Hx    Stomach cancer Neg Hx    Rectal cancer Neg Hx     Social History   Socioeconomic History   Marital status: Married    Spouse name: Not on file   Number of children: 2   Years of education: Not on file   Highest education level: Not on file  Occupational History   Occupation: currently on disability  Tobacco Use   Smoking status: Former Smoker    Packs/day: 0.40    Years: 45.00    Pack years: 18.00    Types: E-cigarettes, Cigarettes    Quit date: 04/30/2013    Years since quitting: 6.6   Smokeless tobacco: Never Used   Tobacco comment: former 1ppd x45 years, 09-13-17 report vaping  Substance and Sexual Activity   Alcohol use: No   Drug use: No   Sexual activity: Not on file  Other Topics Concern   Not on file  Social History Narrative   Husband Jenny Reichmann on disability.    Former Secretary/administrator   1 year of college   Social Determinants of Radio broadcast assistant Strain:    Difficulty of Paying Living Expenses:   Food Insecurity:    Worried About Charity fundraiser in the Last Year:    Arboriculturist in the Last Year:   Transportation Needs:    Film/video editor (Medical):    Lack of Transportation (Non-Medical):   Physical Activity:    Days of Exercise per Week:    Minutes of Exercise per Session:   Stress:    Feeling of Stress :   Social Connections:    Frequency of Communication with Friends and Family:    Frequency of Social Gatherings with Friends and Family:    Attends Religious Services:    Active Member of Clubs or Organizations:    Attends Archivist Meetings:    Marital Status:    Review of Systems - See HPI.  All other ROS are negative.  BP (!) 148/86    Pulse 68    Temp 98.3 F (36.8 C)    Ht 5\' 4"  (1.626 m)    Wt 237 lb 9.6 oz (107.8 kg)    SpO2 98%    BMI 40.78 kg/m   Physical Exam Vitals reviewed.  Constitutional:      Appearance: Normal appearance.  HENT:     Head: Normocephalic  and atraumatic.  Cardiovascular:     Rate and Rhythm: Normal rate and regular rhythm.     Pulses: Normal pulses.  Musculoskeletal:     Cervical back: Neck supple.  Neurological:  Mental Status: She is alert.    Assessment/Plan: 1. Dysuria UA with moderate LE. No CVA tenderness or alarm signs/symptoms. Will send for culture. Will start empiric treatment for UTI with Keflex. Supportive measures and OTC medications reviewed. Will alter treatment regimen based on culture results. Strict ER precautions reviewed with patient.  - POCT urinalysis dipstick - Urine Culture  This visit occurred during the SARS-CoV-2 public health emergency.  Safety protocols were in place, including screening questions prior to the visit, additional usage of staff PPE, and extensive cleaning of exam room while observing appropriate contact time as indicated for disinfecting solutions.     Leeanne Rio, PA-C

## 2020-01-07 ENCOUNTER — Other Ambulatory Visit: Payer: Self-pay | Admitting: Family Medicine

## 2020-01-07 DIAGNOSIS — M25611 Stiffness of right shoulder, not elsewhere classified: Secondary | ICD-10-CM | POA: Diagnosis not present

## 2020-01-07 DIAGNOSIS — M25511 Pain in right shoulder: Secondary | ICD-10-CM | POA: Diagnosis not present

## 2020-01-07 DIAGNOSIS — Z9889 Other specified postprocedural states: Secondary | ICD-10-CM | POA: Diagnosis not present

## 2020-01-07 DIAGNOSIS — M6281 Muscle weakness (generalized): Secondary | ICD-10-CM | POA: Diagnosis not present

## 2020-01-07 DIAGNOSIS — R29898 Other symptoms and signs involving the musculoskeletal system: Secondary | ICD-10-CM | POA: Diagnosis not present

## 2020-01-07 DIAGNOSIS — Z4789 Encounter for other orthopedic aftercare: Secondary | ICD-10-CM | POA: Diagnosis not present

## 2020-01-07 DIAGNOSIS — M75121 Complete rotator cuff tear or rupture of right shoulder, not specified as traumatic: Secondary | ICD-10-CM | POA: Diagnosis not present

## 2020-01-07 LAB — URINE CULTURE
MICRO NUMBER:: 10566286
Result:: NO GROWTH
SPECIMEN QUALITY:: ADEQUATE

## 2020-01-07 NOTE — Telephone Encounter (Signed)
Last OV 01/06/20 [facility]) Oxybuytnin last filled 06/06/19 #180 with 0 B12 last filled 06/09/19 51mL with 6 refills Clonazepam last filled 07/16/19 #30 with 1

## 2020-01-12 DIAGNOSIS — M75121 Complete rotator cuff tear or rupture of right shoulder, not specified as traumatic: Secondary | ICD-10-CM | POA: Diagnosis not present

## 2020-01-12 DIAGNOSIS — M6281 Muscle weakness (generalized): Secondary | ICD-10-CM | POA: Diagnosis not present

## 2020-01-12 DIAGNOSIS — M25611 Stiffness of right shoulder, not elsewhere classified: Secondary | ICD-10-CM | POA: Diagnosis not present

## 2020-01-12 DIAGNOSIS — M25511 Pain in right shoulder: Secondary | ICD-10-CM | POA: Diagnosis not present

## 2020-01-12 DIAGNOSIS — Z4789 Encounter for other orthopedic aftercare: Secondary | ICD-10-CM | POA: Diagnosis not present

## 2020-01-12 DIAGNOSIS — R29898 Other symptoms and signs involving the musculoskeletal system: Secondary | ICD-10-CM | POA: Diagnosis not present

## 2020-01-12 DIAGNOSIS — Z9889 Other specified postprocedural states: Secondary | ICD-10-CM | POA: Diagnosis not present

## 2020-01-15 DIAGNOSIS — M75121 Complete rotator cuff tear or rupture of right shoulder, not specified as traumatic: Secondary | ICD-10-CM | POA: Diagnosis not present

## 2020-01-15 DIAGNOSIS — R29898 Other symptoms and signs involving the musculoskeletal system: Secondary | ICD-10-CM | POA: Diagnosis not present

## 2020-01-15 DIAGNOSIS — Z4789 Encounter for other orthopedic aftercare: Secondary | ICD-10-CM | POA: Diagnosis not present

## 2020-01-15 DIAGNOSIS — M25611 Stiffness of right shoulder, not elsewhere classified: Secondary | ICD-10-CM | POA: Diagnosis not present

## 2020-01-15 DIAGNOSIS — M25511 Pain in right shoulder: Secondary | ICD-10-CM | POA: Diagnosis not present

## 2020-01-15 DIAGNOSIS — M6281 Muscle weakness (generalized): Secondary | ICD-10-CM | POA: Diagnosis not present

## 2020-01-15 DIAGNOSIS — Z9889 Other specified postprocedural states: Secondary | ICD-10-CM | POA: Diagnosis not present

## 2020-01-28 ENCOUNTER — Telehealth: Payer: Self-pay | Admitting: Family Medicine

## 2020-01-28 NOTE — Telephone Encounter (Signed)
I have placed a disability parking placard in the bin upfront, pt would like [place] to mail it once it is completed.

## 2020-01-28 NOTE — Telephone Encounter (Signed)
Paperwork given to PCP for review.  

## 2020-01-29 NOTE — Telephone Encounter (Signed)
FYI

## 2020-01-29 NOTE — Telephone Encounter (Signed)
Form completed and placed in basket  

## 2020-01-29 NOTE — Telephone Encounter (Signed)
Picked up from the back, placed in the outgoing mail bin and sent a copy to scan.

## 2020-02-05 DIAGNOSIS — H35363 Drusen (degenerative) of macula, bilateral: Secondary | ICD-10-CM | POA: Diagnosis not present

## 2020-02-05 DIAGNOSIS — H5203 Hypermetropia, bilateral: Secondary | ICD-10-CM | POA: Diagnosis not present

## 2020-02-10 ENCOUNTER — Other Ambulatory Visit: Payer: Self-pay | Admitting: Family Medicine

## 2020-02-10 DIAGNOSIS — G8929 Other chronic pain: Secondary | ICD-10-CM

## 2020-02-10 MED ORDER — HYDROCODONE-ACETAMINOPHEN 5-325 MG PO TABS: 1.0000 | ORAL_TABLET | Freq: Four times a day (QID) | ORAL | 0 refills | Status: DC | PRN

## 2020-02-10 NOTE — Telephone Encounter (Signed)
Last OV 01/06/20 Hydrocodone last filled 07/16/19 #60 with 0

## 2020-02-10 NOTE — Telephone Encounter (Signed)
Patient needs hydrocodone refilled - [facility] - last appt. - 01/06/2020 with [place] - No appts. Scheduled

## 2020-02-18 ENCOUNTER — Encounter: Payer: Self-pay | Admitting: Internal Medicine

## 2020-03-03 ENCOUNTER — Other Ambulatory Visit: Payer: Self-pay | Admitting: Family Medicine

## 2020-03-17 ENCOUNTER — Ambulatory Visit (AMBULATORY_SURGERY_CENTER): Payer: Self-pay

## 2020-03-17 ENCOUNTER — Other Ambulatory Visit: Payer: Self-pay

## 2020-03-17 ENCOUNTER — Encounter: Payer: Self-pay | Admitting: Internal Medicine

## 2020-03-17 VITALS — Ht 64.0 in | Wt 235.0 lb

## 2020-03-17 DIAGNOSIS — Z86010 Personal history of colonic polyps: Secondary | ICD-10-CM

## 2020-03-17 MED ORDER — SUTAB 1479-225-188 MG PO TABS: 1.0000 | ORAL_TABLET | ORAL | 0 refills | Status: DC

## 2020-03-17 NOTE — Progress Notes (Signed)
No egg or soy allergy known to patient  No issues with past sedation with any surgeries or procedures No intubation problems in the past  No FH of Malignant Hyperthermia No diet pills per patient No home 02 use per patient  No blood thinners per patient  Pt denies issues with constipation  No A fib or A flutter  COVID 19 guidelines implemented in [facility] today with Pt and RN  Coupon given to pt in [facility] today , Code to Pharmacy  COVID vaccines completed on 09/2019 per pt;  Due to the COVID-19 pandemic we are asking patients to follow these guidelines. Please only bring one care partner. Please be aware that your care partner may wait in the car in the parking lot or if they feel like they will be too hot to wait in the car, they may wait in the lobby on the 4th floor. All care partners are required to wear a mask the entire time (we do not have any that we can provide them), they need to practice social distancing, and we will do a Covid check for all patient's and care partners when you arrive. Also we will check their temperature and your temperature. If the care partner waits in their car they need to stay in the parking lot the entire time and we will call them on their cell phone when the patient is ready for discharge so they can bring the car to the front of the building. Also all patient's will need to wear a mask into building.  

## 2020-03-31 ENCOUNTER — Other Ambulatory Visit: Payer: Self-pay | Admitting: Family Medicine

## 2020-03-31 DIAGNOSIS — G8929 Other chronic pain: Secondary | ICD-10-CM

## 2020-03-31 MED ORDER — HYDROCODONE-ACETAMINOPHEN 5-325 MG PO TABS: 1.0000 | ORAL_TABLET | Freq: Four times a day (QID) | ORAL | 0 refills | Status: DC | PRN

## 2020-03-31 MED ORDER — CLONAZEPAM 0.5 MG PO TABS: 0.5000 mg | ORAL_TABLET | Freq: Every evening | ORAL | 0 refills | Status: DC | PRN

## 2020-03-31 MED ORDER — OXYBUTYNIN CHLORIDE 5 MG PO TABS: 5.0000 mg | ORAL_TABLET | Freq: Two times a day (BID) | ORAL | 0 refills | Status: DC

## 2020-03-31 NOTE — Telephone Encounter (Signed)
Please look behind me. It appears as though the CLonazepam last filled 01/08/20 #30 with 0  Hydrocodone last filled 02/10/20 #60 with 0 Oxybutynin last filled 01/08/20 #180 with 0 are the only that are due for refills currently.

## 2020-03-31 NOTE — Telephone Encounter (Signed)
Pt is requesting to have all of her prescriptions sent to [facility] on [address] in [place]. Please advise.

## 2020-04-01 ENCOUNTER — Encounter: Payer: Self-pay | Admitting: Internal Medicine

## 2020-04-01 ENCOUNTER — Other Ambulatory Visit: Payer: Self-pay

## 2020-04-01 ENCOUNTER — Ambulatory Visit (AMBULATORY_SURGERY_CENTER): Payer: PPO | Admitting: Internal Medicine

## 2020-04-01 VITALS — BP 159/75 | HR 63 | Temp 98.7°F | Resp 14 | Ht 64.0 in | Wt 235.0 lb

## 2020-04-01 DIAGNOSIS — Z86010 Personal history of colonic polyps: Secondary | ICD-10-CM | POA: Diagnosis not present

## 2020-04-01 DIAGNOSIS — G4733 Obstructive sleep apnea (adult) (pediatric): Secondary | ICD-10-CM | POA: Diagnosis not present

## 2020-04-01 DIAGNOSIS — D123 Benign neoplasm of transverse colon: Secondary | ICD-10-CM

## 2020-04-01 DIAGNOSIS — R1031 Right lower quadrant pain: Secondary | ICD-10-CM

## 2020-04-01 DIAGNOSIS — D128 Benign neoplasm of rectum: Secondary | ICD-10-CM

## 2020-04-01 DIAGNOSIS — K621 Rectal polyp: Secondary | ICD-10-CM | POA: Diagnosis not present

## 2020-04-01 DIAGNOSIS — J449 Chronic obstructive pulmonary disease, unspecified: Secondary | ICD-10-CM | POA: Diagnosis not present

## 2020-04-01 DIAGNOSIS — J45909 Unspecified asthma, uncomplicated: Secondary | ICD-10-CM | POA: Diagnosis not present

## 2020-04-01 DIAGNOSIS — D122 Benign neoplasm of ascending colon: Secondary | ICD-10-CM

## 2020-04-01 DIAGNOSIS — R197 Diarrhea, unspecified: Secondary | ICD-10-CM | POA: Diagnosis not present

## 2020-04-01 DIAGNOSIS — I10 Essential (primary) hypertension: Secondary | ICD-10-CM | POA: Diagnosis not present

## 2020-04-01 MED ORDER — SODIUM CHLORIDE 0.9 % IV SOLN: 500.0000 mL | Freq: Once | INTRAVENOUS | Status: DC

## 2020-04-01 NOTE — Patient Instructions (Signed)
Handout on polyps. Please call office to schedule appointment to further discuss your complaints of abdominal pain, diarrhea and incontinence.    YOU HAD AN ENDOSCOPIC PROCEDURE TODAY AT [facility]:   Refer to the procedure report that was given to you for any specific questions about what was found during the examination.  If the procedure report does not answer your questions, please call your gastroenterologist to clarify.  If you requested that your care partner not be given the details of your procedure findings, then the procedure report has been included in a sealed envelope for you to review at your convenience later.  YOU SHOULD EXPECT: Some feelings of bloating in the abdomen. Passage of more gas than usual.  Walking can help get rid of the air that was put into your GI tract during the procedure and reduce the bloating. If you had a lower endoscopy (such as a colonoscopy or flexible sigmoidoscopy) you may notice spotting of blood in your stool or on the toilet paper. If you underwent a bowel prep for your procedure, you may not have a normal bowel movement for a few days.  Please Note:  You might notice some irritation and congestion in your nose or some drainage.  This is from the oxygen used during your procedure.  There is no need for concern and it should clear up in a day or so.  SYMPTOMS TO REPORT IMMEDIATELY:   Following lower endoscopy (colonoscopy or flexible sigmoidoscopy):  Excessive amounts of blood in the stool  Significant tenderness or worsening of abdominal pains  Swelling of the abdomen that is new, acute  Fever of 100F or higher   For urgent or emergent issues, a gastroenterologist can be reached at any hour by calling [phone]. Do not use MyChart messaging for urgent concerns.    DIET:  We do recommend a small meal at first, but then you may proceed to your regular diet.  Drink plenty of fluids but you should avoid alcoholic beverages for  24 hours.  ACTIVITY:  You should plan to take it easy for the rest of today and you should NOT DRIVE or use heavy machinery until tomorrow (because of the sedation medicines used during the test).    FOLLOW UP: Our staff will call the number listed on your records 48-72 hours following your procedure to check on you and address any questions or concerns that you may have regarding the information given to you following your procedure. If we do not reach you, we will leave a message.  We will attempt to reach you two times.  During this call, we will ask if you have developed any symptoms of COVID 19. If you develop any symptoms (ie: fever, flu-like symptoms, shortness of breath, cough etc.) before then, please call [phone].  If you test positive for Covid 19 in the 2 weeks post procedure, please call and report this information to [place].    If any biopsies were taken you will be contacted by phone or by letter within the next 1-3 weeks.  Please call us at [phone] if you have not heard about the biopsies in 3 weeks.    SIGNATURES/CONFIDENTIALITY: You and/or your care partner have signed paperwork which will be entered into your electronic medical record.  These signatures attest to the fact that that the information above on your After Visit Summary has been reviewed and is understood.  Full responsibility of the confidentiality of this discharge information lies  with you and/or your care-partner.

## 2020-04-01 NOTE — Progress Notes (Signed)
Report to PACU, RN, vss, BBS= Clear.  

## 2020-04-01 NOTE — Progress Notes (Signed)
Called to room to assist during endoscopic procedure.  Patient ID and intended procedure confirmed with present staff. Received instructions for my participation in the procedure from the performing physician.  

## 2020-04-01 NOTE — Op Note (Signed)
[facility] Patient Name: Ashley Woods Procedure Date: 04/01/2020 2:57 PM MRN: [id number] Endoscopist: Wilhemina Bonito. Marina Goodell , MD Age: 74 Referring MD:  Date of Birth: 10-15-1945 Gender: Female Account #: [id number] Procedure:                Colonoscopy with cold snare polypectomy x 4; with                            biopsies Indications:              High risk colon cancer surveillance: Personal                            history of multiple (3 or more) adenomas. Previous                            examinations 2007 and 2013. Overdue for follow-up.                            NOTE: She has not been seen since 2013 but presents                            today reporting at least a 1 month history of                            problems with abdominal pain, particularly on the                            right, worsening chronic diarrhea, urgency, and                            incontinence. She has not sought medical attention                            prior to this visit. Medicines:                Monitored Anesthesia Care Procedure:                Pre-Anesthesia Assessment:                           - Prior to the procedure, a History and Physical                            was performed, and patient medications and                            allergies were reviewed. The patient's tolerance of                            previous anesthesia was also reviewed. The risks                            and benefits of the procedure and the sedation  options and risks were discussed with the patient.                            All questions were answered, and informed consent                            was obtained. Prior Anticoagulants: The patient has                            taken no previous anticoagulant or antiplatelet                            agents. ASA Grade Assessment: II - A patient with                            mild systemic disease. After  reviewing the risks                            and benefits, the patient was deemed in                            satisfactory condition to undergo the procedure.                           After obtaining informed consent, the colonoscope                            was passed under direct vision. Throughout the                            procedure, the patient's blood pressure, pulse, and                            oxygen saturations were monitored continuously. The                            Colonoscope was introduced through the anus and                            advanced to the the cecum, identified by                            appendiceal orifice and ileocecal valve. The                            terminal ileum, ileocecal valve, appendiceal                            orifice, and rectum were photographed. The quality                            of the bowel preparation was excellent. The  colonoscopy was performed without difficulty. The                            patient tolerated the procedure well. The bowel                            preparation used was SUPREP via split dose                            instruction. Scope In: 3:14:52 PM Scope Out: 3:33:38 PM Scope Withdrawal Time: 0 hours 16 minutes 0 seconds  Total Procedure Duration: 0 hours 18 minutes 46 seconds  Findings:                 The terminal ileum appeared normal.                           Four polyps were found in the rectum, transverse                            colon and ascending colon. The polyps were 2 to 3                            mm in size. These polyps were removed with a cold                            snare. Resection and retrieval were complete.                           The entire examined colon appeared normal on direct                            and retroflexion views. Biopsies for histology were                            taken with a cold forceps from the entire colon for                             evaluation of microscopic colitis. Complications:            No immediate complications. Estimated blood loss:                            None. Estimated Blood Loss:     Estimated blood loss: none. Impression:               - The examined portion of the ileum was normal.                           - Four 2 to 3 mm polyps in the rectum, in the                            transverse colon and in the ascending colon,  removed with a cold snare. Resected and retrieved.                           - The entire examined colon is normal on direct and                            retroflexion views. Recommendation:           - Repeat colonoscopy in 5 years for surveillance.                           - Patient has a contact number available for                            emergencies. The signs and symptoms of potential                            delayed complications were discussed with the                            patient. Return to normal activities tomorrow.                            Written discharge instructions were provided to the                            patient.                           - Resume previous diet.                           - Continue present medications.                           - Await pathology results. We will communicate the                            results when available.                           - May use Imodium more regularly for diarrhea                           - Please make an office appointment to more                            thoroughly evaluate your complaints of abdominal                            pain, diarrhea, and incontinence Astin Rape N. Marina Goodell, MD 04/01/2020 3:42:44 PM This report has been signed electronically.

## 2020-04-01 NOTE — Progress Notes (Signed)
VS-CW  Pt's states no medical or surgical changes since previsit or office visit.  

## 2020-04-06 ENCOUNTER — Telehealth: Payer: Self-pay | Admitting: *Deleted

## 2020-04-06 NOTE — Telephone Encounter (Signed)
  Follow up Call-  Call back number 04/01/2020  Post procedure Call Back phone  # [phone]  Permission to leave phone message Yes  Some recent data might be hidden     Patient questions:  Do you have a fever, pain , or abdominal swelling? No. Pain Score  0 *  Have you tolerated food without any problems? Yes.    Have you been able to return to your normal activities? Yes.    Do you have any questions about your discharge instructions: Diet   No. Medications  No. Follow up visit  No.  Do you have questions or concerns about your Care? No.  Actions: * If pain score is 4 or above: No action needed, pain <4.  1. Have you developed a fever since your procedure? no  2.   Have you had an respiratory symptoms (SOB or cough) since your procedure? no  3.   Have you tested positive for COVID 19 since your procedure no  4.   Have you had any family members/close contacts diagnosed with the COVID 19 since your procedure?  no   If yes to any of these questions please route to Joylene John, RN and Joella Prince, RN

## 2020-04-07 ENCOUNTER — Encounter: Payer: Self-pay | Admitting: Internal Medicine

## 2020-04-29 DIAGNOSIS — G4733 Obstructive sleep apnea (adult) (pediatric): Secondary | ICD-10-CM | POA: Diagnosis not present

## 2020-05-03 ENCOUNTER — Other Ambulatory Visit: Payer: Self-pay | Admitting: General Practice

## 2020-05-03 MED ORDER — CITALOPRAM HYDROBROMIDE 40 MG PO TABS
40.0000 mg | ORAL_TABLET | Freq: Every day | ORAL | 0 refills | Status: DC
Start: 2020-05-03 — End: 2020-07-29

## 2020-05-03 MED ORDER — LISINOPRIL 30 MG PO TABS
30.0000 mg | ORAL_TABLET | Freq: Every day | ORAL | 0 refills | Status: DC
Start: 2020-05-03 — End: 2020-09-06

## 2020-05-03 MED ORDER — LEVOTHYROXINE SODIUM 100 MCG PO TABS: 100.0000 ug | ORAL_TABLET | Freq: Every day | ORAL | 0 refills | Status: DC

## 2020-05-10 DIAGNOSIS — H47322 Drusen of optic disc, left eye: Secondary | ICD-10-CM | POA: Diagnosis not present

## 2020-05-10 DIAGNOSIS — H35363 Drusen (degenerative) of macula, bilateral: Secondary | ICD-10-CM | POA: Diagnosis not present

## 2020-05-25 ENCOUNTER — Ambulatory Visit (INDEPENDENT_AMBULATORY_CARE_PROVIDER_SITE_OTHER): Payer: PPO | Admitting: Family Medicine

## 2020-05-25 ENCOUNTER — Encounter: Payer: Self-pay | Admitting: Family Medicine

## 2020-05-25 ENCOUNTER — Other Ambulatory Visit: Payer: Self-pay

## 2020-05-25 VITALS — BP 134/70 | HR 64 | Temp 97.7°F | Resp 17 | Ht 64.0 in | Wt 234.8 lb

## 2020-05-25 DIAGNOSIS — Z23 Encounter for immunization: Secondary | ICD-10-CM

## 2020-05-25 DIAGNOSIS — E559 Vitamin D deficiency, unspecified: Secondary | ICD-10-CM

## 2020-05-25 DIAGNOSIS — R040 Epistaxis: Secondary | ICD-10-CM | POA: Diagnosis not present

## 2020-05-25 DIAGNOSIS — R234 Changes in skin texture: Secondary | ICD-10-CM | POA: Diagnosis not present

## 2020-05-25 DIAGNOSIS — R1013 Epigastric pain: Secondary | ICD-10-CM

## 2020-05-25 DIAGNOSIS — Z6841 Body Mass Index (BMI) 40.0 and over, adult: Secondary | ICD-10-CM

## 2020-05-25 DIAGNOSIS — Z0000 Encounter for general adult medical examination without abnormal findings: Secondary | ICD-10-CM

## 2020-05-25 DIAGNOSIS — E538 Deficiency of other specified B group vitamins: Secondary | ICD-10-CM | POA: Diagnosis not present

## 2020-05-25 DIAGNOSIS — Z1159 Encounter for screening for other viral diseases: Secondary | ICD-10-CM

## 2020-05-25 DIAGNOSIS — Z0001 Encounter for general adult medical examination with abnormal findings: Secondary | ICD-10-CM | POA: Diagnosis not present

## 2020-05-25 LAB — CBC WITH DIFFERENTIAL/PLATELET
Basophils Absolute: 0 10*3/uL (ref 0.0–0.1)
Basophils Relative: 0.9 % (ref 0.0–3.0)
Eosinophils Absolute: 0.1 10*3/uL (ref 0.0–0.7)
Eosinophils Relative: 2.1 % (ref 0.0–5.0)
HCT: 43.6 % (ref 36.0–46.0)
Hemoglobin: 14.7 g/dL (ref 12.0–15.0)
Lymphocytes Relative: 24.3 % (ref 12.0–46.0)
Lymphs Abs: 1.1 10*3/uL (ref 0.7–4.0)
MCHC: 33.8 g/dL (ref 30.0–36.0)
MCV: 94 fl (ref 78.0–100.0)
Monocytes Absolute: 0.3 10*3/uL (ref 0.1–1.0)
Monocytes Relative: 7.4 % (ref 3.0–12.0)
Neutro Abs: 3 10*3/uL (ref 1.4–7.7)
Neutrophils Relative %: 65.3 % (ref 43.0–77.0)
Platelets: 151 10*3/uL (ref 150.0–400.0)
RBC: 4.63 Mil/uL (ref 3.87–5.11)
RDW: 12.9 % (ref 11.5–15.5)
WBC: 4.6 10*3/uL (ref 4.0–10.5)

## 2020-05-25 LAB — VITAMIN D 25 HYDROXY (VIT D DEFICIENCY, FRACTURES): VITD: 28.97 ng/mL — ABNORMAL LOW (ref 30.00–100.00)

## 2020-05-25 LAB — AMYLASE: Amylase: 45 U/L (ref 27–131)

## 2020-05-25 LAB — BASIC METABOLIC PANEL
BUN: 14 mg/dL (ref 6–23)
CO2: 31 mEq/L (ref 19–32)
Calcium: 9.3 mg/dL (ref 8.4–10.5)
Chloride: 103 mEq/L (ref 96–112)
Creatinine, Ser: 0.85 mg/dL (ref 0.40–1.20)
GFR: 67.64 mL/min (ref >60.00)
Glucose, Bld: 96 mg/dL (ref 70–99)
Potassium: 4.5 mEq/L (ref 3.5–5.1)
Sodium: 141 mEq/L (ref 135–145)

## 2020-05-25 LAB — HEPATIC FUNCTION PANEL
ALT: 15 U/L (ref 0–35)
AST: 15 U/L (ref 0–37)
Albumin: 4.4 g/dL (ref 3.5–5.2)
Alkaline Phosphatase: 70 U/L (ref 39–117)
Bilirubin, Direct: 0.1 mg/dL (ref 0.0–0.3)
Total Bilirubin: 0.7 mg/dL (ref 0.2–1.2)
Total Protein: 6 g/dL (ref 6.0–8.3)

## 2020-05-25 LAB — LIPID PANEL
Cholesterol: 165 mg/dL (ref 0–200)
HDL: 52.9 mg/dL (ref >39.00)
LDL Cholesterol: 73 mg/dL (ref 0–99)
NonHDL: 112.51
Total CHOL/HDL Ratio: 3
Triglycerides: 196 mg/dL — ABNORMAL HIGH (ref 0.0–149.0)
VLDL: 39.2 mg/dL (ref 0.0–40.0)

## 2020-05-25 LAB — LIPASE: Lipase: 13 U/L (ref 11.0–59.0)

## 2020-05-25 LAB — TSH: TSH: 2.68 u[IU]/mL (ref 0.35–4.50)

## 2020-05-25 LAB — VITAMIN B12: Vitamin B-12: 396 pg/mL (ref 211–911)

## 2020-05-25 NOTE — Assessment & Plan Note (Signed)
Pt has hx of this.  Check labs and replete prn. 

## 2020-05-25 NOTE — Assessment & Plan Note (Signed)
Pt's PE WNL w/ exception of obesity and silver plaques on scalp.  UTD on colonoscopy, mammogram, pneumonia vaccines, COVID vaccines.  Flu shot given today.  Check labs.  Anticipatory guidance provided.

## 2020-05-25 NOTE — Assessment & Plan Note (Signed)
Ongoing issue for pt.  BMI is 40.30  Stressed need for healthy diet and regular exercise.  Check labs to risk stratify.  Will follow.

## 2020-05-25 NOTE — Patient Instructions (Addendum)
Follow up in 6 months to recheck BP and cholesterol We'll notify you of your lab results and make any changes if needed We'll call you with your Dermatology referral for your scalp issue Please call Dr Wayna Chalet (vascular surgery [facility]) for a follow up appt Call and set up an appt w/ ENT for the nose bleeds Call with any questions or concerns Stay Safe!  Stay Healthy!

## 2020-05-25 NOTE — Progress Notes (Signed)
Subjective:    Patient ID: Ashley Woods, female    DOB: 10-Dec-1945, 74 y.o.   MRN: [id number]  HPI CPE- UTD on colonoscopy, mammo.  UTD on pneumonia vaccines, COVID vaccines.  Will get flu shot today.  Reviewed past medical, surgical, family and social histories.   Health Maintenance  Topic Date Due  . Hepatitis C Screening  Never done  . TETANUS/TDAP  Never done  . INFLUENZA VACCINE  02/29/2020  . MAMMOGRAM  07/08/2020  . COLONOSCOPY  04/01/2025  . DEXA SCAN  Completed  . COVID-19 Vaccine  Completed  . PNA vac Low Risk Adult  Completed     Patient Care Team    Relationship Specialty Notifications Start End  Midge Minium, MD PCP - General   07/13/10    Comment: Merged (Merged)  Irene Shipper, MD Consulting Physician Gastroenterology  04/28/15   Renelda Loma, [place] Physician Optometry  04/28/15   Rockey Situ  Obstetrics and Gynecology  10/12/16   Franchot Mimes, Teodora Medici, MD Referring Physician Pulmonary Disease  04/24/18       Review of Systems Patient reports no vision/ hearing changes, adenopathy,fever, weight change,  persistant/recurrent hoarseness , swallowing issues, chest pain, palpitations, edema, persistant/recurrent cough, hemoptysis, dyspnea (rest/exertional/paroxysmal nocturnal), gastrointestinal bleeding (melena, rectal bleeding), significant heartburn, bowel changes, GU symptoms (dysuria, hematuria, incontinence), Gyn symptoms (abnormal  bleeding, pain),  syncope, focal weakness, memory loss, numbness & tingling, hair/nail changes, abnormal bruising, anxiety, or depression.   Nose bleed- L nostril.  Pt reports this started while on vacation [place], [place]).  Has had multiple episodes since.  No pattern.  'it's bright red and just gushes'.  Bleeding lasts 15-20 minutes.  Has ENT.  + epigastric tenderness- pt reports ongoing diarrhea despite GI workup.  Better than previously- no longer having accident.  Pt is concerned about pancreatic  insufficiency  Scaly places on scalp- itchy  This visit occurred during the SARS-CoV-2 public health emergency.  Safety protocols were in place, including screening questions prior to the visit, additional usage of staff PPE, and extensive cleaning of exam room while observing appropriate contact time as indicated for disinfecting solutions.       Objective:   Physical Exam General Appearance:    Alert, cooperative, no distress, appears stated age, obese  Head:    Normocephalic, without obvious abnormality, atraumatic  Eyes:    PERRL, conjunctiva/corneas clear, EOM's intact, fundi    benign, both eyes  Ears:    Normal TM's and external ear canals, both ears  Nose:   Deferred due to COVID  Throat:   Neck:   Supple, symmetrical, trachea midline, no adenopathy;    Thyroid: no enlargement/tenderness/nodules  Back:     Symmetric, no curvature, ROM normal, no CVA tenderness  Lungs:     Clear to auscultation bilaterally, respirations unlabored  Chest Wall:    No tenderness or deformity   Heart:    Regular rate and rhythm, S1 and S2 normal, no murmur, rub   or gallop  Breast Exam:    Deferred to mammo  Abdomen:     Soft, non-tender, bowel sounds active all four quadrants,    no masses, no organomegaly  Genitalia:    Deferred  Rectal:    Extremities:   Extremities normal, atraumatic, no cyanosis or edema  Pulses:   2+ and symmetric all extremities  Skin:   Skin color, texture, turgor normal.  Multiple white/silver scaly plaques on scalp  Lymph nodes:  Cervical, supraclavicular, and axillary nodes normal  Neurologic:   CNII-XII intact, normal strength, sensation and reflexes    throughout          Assessment & Plan:  Epistaxis- new.  Pt reports recurrent nose bleeds w/o obvious triggers.  Has ENT at [facility].  Offered referral but pt prefers to call and schedule.  Epigastric pain- new.  Pt feels that her diarrhea may actually be pancreatic insufficiency.  Has intermittent epigastric  tenderness.  Will check labs to determine if this is cause.  Pt has GI specialist  Scalp flaking- new.  Pt has white/silver plaques scattered in scalp.  Possibly psoriasis.  Will refer to Derm for evaluation and tx.  Pt expressed understanding and is in agreement w/ plan.

## 2020-05-26 ENCOUNTER — Other Ambulatory Visit: Payer: Self-pay

## 2020-05-26 DIAGNOSIS — E559 Vitamin D deficiency, unspecified: Secondary | ICD-10-CM

## 2020-05-26 LAB — HEPATITIS C ANTIBODY
Hepatitis C Ab: NONREACTIVE
SIGNAL TO CUT-OFF: 0.01 (ref <1.00)

## 2020-05-26 MED ORDER — VITAMIN D (ERGOCALCIFEROL) 1.25 MG (50000 UNIT) PO CAPS: 50000.0000 [IU] | ORAL_CAPSULE | ORAL | 0 refills | Status: DC

## 2020-05-26 NOTE — Progress Notes (Signed)
ER

## 2020-05-31 ENCOUNTER — Telehealth: Payer: Self-pay | Admitting: Family Medicine

## 2020-05-31 DIAGNOSIS — M25512 Pain in left shoulder: Secondary | ICD-10-CM

## 2020-05-31 NOTE — Telephone Encounter (Signed)
Patient would like to be referred to Orthopedist Dr. Alphonzo Grieve - patient states that she has discussed the problem with you before and that you are aware of her concern.

## 2020-06-01 NOTE — Telephone Encounter (Signed)
[place] for Ortho referral- dx L shoulder pain

## 2020-06-01 NOTE — Telephone Encounter (Signed)
Please advise 

## 2020-06-01 NOTE — Telephone Encounter (Signed)
Referral placed. Pt husband informed.

## 2020-06-01 NOTE — Addendum Note (Signed)
Addended by: Davis Gourd on: 06/01/2020 10:33 AM   Modules accepted: Orders

## 2020-06-14 DIAGNOSIS — M25512 Pain in left shoulder: Secondary | ICD-10-CM | POA: Diagnosis not present

## 2020-06-14 DIAGNOSIS — G8929 Other chronic pain: Secondary | ICD-10-CM | POA: Diagnosis not present

## 2020-06-18 DIAGNOSIS — G8929 Other chronic pain: Secondary | ICD-10-CM | POA: Diagnosis not present

## 2020-06-18 DIAGNOSIS — M25512 Pain in left shoulder: Secondary | ICD-10-CM | POA: Diagnosis not present

## 2020-06-23 ENCOUNTER — Other Ambulatory Visit: Payer: Self-pay

## 2020-06-23 ENCOUNTER — Telehealth: Payer: Self-pay | Admitting: Family Medicine

## 2020-06-23 DIAGNOSIS — E785 Hyperlipidemia, unspecified: Secondary | ICD-10-CM

## 2020-06-23 MED ORDER — SIMVASTATIN 40 MG PO TABS: 40.0000 mg | ORAL_TABLET | Freq: Every day | ORAL | 0 refills | Status: DC

## 2020-06-23 NOTE — Telephone Encounter (Signed)
..  Medication Refills  Medication:Simbastatin 40 mg. Pharmacy:[facility] - [phone] - [address] [place] ** Let patient know to contact pharmacy at the end of the day to make sure medication is ready.**  ** Please notify patient to allow 48-72 hours to process.**  ** Encourage patient to contact the pharmacy for refills or they can request refills through [facility]**  Clinical Fills out below:   Last refill:  QTY:  Refill Date:    Other Comments:  Patient states that she is totally out of medication   Okay for refill?  Please advise.

## 2020-06-23 NOTE — Telephone Encounter (Signed)
Rx sent to pharmacy   

## 2020-06-30 DIAGNOSIS — M25512 Pain in left shoulder: Secondary | ICD-10-CM | POA: Diagnosis not present

## 2020-06-30 DIAGNOSIS — G8929 Other chronic pain: Secondary | ICD-10-CM | POA: Diagnosis not present

## 2020-06-30 DIAGNOSIS — M75102 Unspecified rotator cuff tear or rupture of left shoulder, not specified as traumatic: Secondary | ICD-10-CM | POA: Diagnosis not present

## 2020-07-05 DIAGNOSIS — M75112 Incomplete rotator cuff tear or rupture of left shoulder, not specified as traumatic: Secondary | ICD-10-CM | POA: Diagnosis not present

## 2020-07-05 DIAGNOSIS — M25512 Pain in left shoulder: Secondary | ICD-10-CM | POA: Diagnosis not present

## 2020-07-05 DIAGNOSIS — G8929 Other chronic pain: Secondary | ICD-10-CM | POA: Diagnosis not present

## 2020-07-07 DIAGNOSIS — M75112 Incomplete rotator cuff tear or rupture of left shoulder, not specified as traumatic: Secondary | ICD-10-CM | POA: Insufficient documentation

## 2020-07-26 DIAGNOSIS — M75102 Unspecified rotator cuff tear or rupture of left shoulder, not specified as traumatic: Secondary | ICD-10-CM | POA: Insufficient documentation

## 2020-07-29 ENCOUNTER — Other Ambulatory Visit: Payer: Self-pay | Admitting: Family Medicine

## 2020-07-29 ENCOUNTER — Other Ambulatory Visit: Payer: Self-pay

## 2020-07-29 MED ORDER — OXYBUTYNIN CHLORIDE 5 MG PO TABS
5.0000 mg | ORAL_TABLET | Freq: Two times a day (BID) | ORAL | 0 refills | Status: DC
Start: 2020-07-29 — End: 2020-11-01

## 2020-07-29 MED ORDER — OMEPRAZOLE 40 MG PO CPDR
40.0000 mg | DELAYED_RELEASE_CAPSULE | Freq: Every day | ORAL | 0 refills | Status: DC
Start: 2020-07-29 — End: 2020-11-01

## 2020-08-02 ENCOUNTER — Other Ambulatory Visit: Payer: Self-pay

## 2020-08-02 ENCOUNTER — Ambulatory Visit: Payer: PPO

## 2020-08-02 NOTE — Progress Notes (Deleted)
Subjective:   Ashley Woods is a 75 y.o. female who presents for Medicare Annual (Subsequent) preventive examination.   I connected with Ashley Woods today by telephone and verified that I am speaking with the correct person using two identifiers. Location patient: home Location provider: work Persons participating in the virtual visit: patient, Engineer, civil (consulting).    I discussed the limitations, risks, security and privacy concerns of performing an evaluation and management service by telephone and the availability of in person appointments. I also discussed with the patient that there may be a patient responsible charge related to this service. The patient expressed understanding and verbally consented to this telephonic visit.    Interactive audio and video telecommunications were attempted between this provider and patient, however failed, due to patient having technical difficulties OR patient did not have access to video capability.  We continued and completed visit with audio only.  Some vital signs may be absent or patient reported.   Time Spent with patient on telephone encounter: *** minutes  Review of Systems    ***       Objective:    There were no vitals filed for this visit. There is no height or weight on file to calculate BMI.  Advanced Directives 04/24/2018 09/13/2017 10/12/2016  Does Patient Have a Medical Advance Directive? Yes Yes No  Type of Advance Directive Living will;Healthcare Power of Attorney Living will -  Copy of Healthcare Power of Attorney in Chart? No - copy requested - -  Would patient like information on creating a medical advance directive? - - No - Patient declined    Current Medications (verified) Outpatient Encounter Medications as of 08/02/2020  Medication Sig  . albuterol (PROVENTIL HFA;VENTOLIN HFA) 108 (90 Base) MCG/ACT inhaler Inhale into the lungs.  . calcium-vitamin D (OSCAL 500/200 D-3) 500-200 MG-UNIT tablet Take by mouth.  . citalopram (CELEXA)  40 MG tablet TAKE 1 TABLET(40 MG) BY MOUTH DAILY  . clonazePAM (KLONOPIN) 0.5 MG tablet Take 1 tablet (0.5 mg total) by mouth at bedtime as needed.  . cyanocobalamin (,VITAMIN B-12,) 1000 MCG/ML injection INJECT 1 ML INTO THE MUSCLE EVERY 30 DAYS  . furosemide (LASIX) 20 MG tablet Take 2 tablets (40 mg total) by mouth daily.  Marland Kitchen HYDROcodone-acetaminophen (NORCO/VICODIN) 5-325 MG tablet Take 1 tablet by mouth every 6 (six) hours as needed.  Marland Kitchen levothyroxine (EUTHYROX) 100 MCG tablet Take 1 tablet (100 mcg total) by mouth daily.  Marland Kitchen lisinopril (ZESTRIL) 30 MG tablet Take 1 tablet (30 mg total) by mouth daily.  Marland Kitchen loperamide (IMODIUM A-D) 2 MG tablet Take 2 mg by mouth.   Marland Kitchen omeprazole (PRILOSEC) 40 MG capsule Take 1 capsule (40 mg total) by mouth daily.  Marland Kitchen oxybutynin (DITROPAN) 5 MG tablet Take 1 tablet (5 mg total) by mouth 2 (two) times daily.  . RESTASIS 0.05 % ophthalmic emulsion Place 1 drop into both eyes 2 (two) times daily.   . simvastatin (ZOCOR) 40 MG tablet Take 1 tablet (40 mg total) by mouth at bedtime.  . SYRINGE-NEEDLE, DISP, 3 ML (BD SAFETYGLIDE SYRINGE/NEEDLE) 25G X 1" 3 ML MISC Please use one syringe and needle for each injection.  . Vitamin D, Ergocalciferol, (DRISDOL) 1.25 MG (50000 UNIT) CAPS capsule Take 1 capsule (50,000 Units total) by mouth every 7 (seven) days.   No facility-administered encounter medications on file as of 08/02/2020.    Allergies (verified) Bactrim [sulfamethoxazole-trimethoprim], Naproxen, Trimethoprim, Sulfamethoxazole-trimethoprim, Tramadol, Tramadol hcl, and Zanaflex [tizanidine]   History: Past Medical History:  Diagnosis Date  . Anxiety    on meds  . Asthma    occassionally  . Cataract    bilateral-sx   . Colon polyp    adenomatous  . Colon polyps   . COPD (chronic obstructive pulmonary disease) (HCC)   . Depression    on meds  . Diverticulosis   . GERD (gastroesophageal reflux disease)    on meds  . Hemorrhoids   . Hyperlipidemia     on meds  . Hypertension    on meds  . IBS (irritable bowel syndrome)   . Macular degeneration    Dr. Jackquline Bosch  . Overactive bladder   . Right arm fracture    2018  . Rosacea   . Sleep apnea    uses cpap  . Stroke [facility])    hx of numerous TIA per MRI  . Thyroid disease    on meds   Past Surgical History:  Procedure Laterality Date  . BLADDER SURGERY  2015  . CATARACT EXTRACTION, BILATERAL  2016  . CERVICAL FUSION  2000  . COLONOSCOPY  2013   TA  . HERNIA REPAIR  2010   umbilical  . HYSTERECTOMY ABDOMINAL WITH SALPINGO-OOPHORECTOMY  1995  . OOPHORECTOMY    . PARTIAL HYSTERECTOMY    . POLYPECTOMY  2013   TA  . SHOULDER SURGERY Right 09/2019  . TONSILLECTOMY  1960   Family History  Problem Relation Age of Onset  . Heart disease Daughter   . Evelene Croon Parkinson White syndrome Daughter   . Colon cancer Neg Hx   . Inflammatory bowel disease Neg Hx   . Esophageal cancer Neg Hx   . Stomach cancer Neg Hx   . Rectal cancer Neg Hx   . Colon polyps Neg Hx    Social History   Socioeconomic History  . Marital status: Married    Spouse name: Not on file  . Number of children: 2  . Years of education: Not on file  . Highest education level: Not on file  Occupational History  . Occupation: currently on disability  Tobacco Use  . Smoking status: Former Smoker    Packs/day: 0.40    Years: 45.00    Pack years: 18.00    Types: E-cigarettes, Cigarettes    Quit date: 04/30/2013    Years since quitting: 7.2  . Smokeless tobacco: Never Used  . Tobacco comment: former 1ppd x45 years, 09-13-17 report vaping  Vaping Use  . Vaping Use: Some days  Substance and Sexual Activity  . Alcohol use: No  . Drug use: No  . Sexual activity: Not on file  Other Topics Concern  . Not on file  Social History Narrative   Husband Ashley Woods on disability.    Former Advertising copywriter   1 year of college   Social Determinants of Corporate investment banker Strain: Not on [employer] Insecurity: Not on  file  Transportation Needs: Not on file  Physical Activity: Not on file  Stress: Not on file  Social Connections: Not on file    Tobacco Counseling Counseling given: Not Answered Comment: former 1ppd x45 years, 09-13-17 report vaping   Clinical Intake:                 Diabetic?No         Activities of Daily Living In your present state of health, do you have any difficulty performing the following activities: 05/25/2020  Hearing? Y  Vision? N  Difficulty concentrating  or making decisions? N  Walking or climbing stairs? N  Dressing or bathing? N  Doing errands, shopping? N  Some recent data might be hidden    Patient Care Team: Midge Minium, MD as PCP - General Irene Shipper, MD as Consulting Physician (Gastroenterology) Renelda Loma, OD as Consulting Physician (Optometry) Landry Mellow, Candace (Obstetrics and Gynecology) Franchot Mimes, Teodora Medici, MD as Referring Physician (Pulmonary Disease)  Indicate any recent Medical Services you may have received from other than [facility] providers in the past year (date may be approximate).     Assessment:   This is a routine wellness examination for [facility].  Hearing/Vision screen No exam data present  Dietary issues and exercise activities discussed:    Goals    . Weight (lb) < 180 lb (81.6 kg)     Would like to lose weight, plans to cut back on portion. Increase activity by starting to go to [facility].       Depression Screen PHQ 2/9 Scores 05/25/2020 06/06/2019 09/02/2018 04/24/2018 10/08/2017 02/14/2017 10/12/2016  PHQ - 2 Score 0 0 0 1 0 0 0  PHQ- 9 Score 0 - - 7 0 0 -  Exception Documentation - - - - - - -    Fall Risk Fall Risk  05/25/2020 01/06/2020 06/06/2019 09/02/2018 04/24/2018  Falls in the past year? 0 0 0 0 No  Comment - - - - -  Number falls in past yr: 0 0 0 0 -  Injury with Fall? 0 0 0 0 -  Risk for fall due to : No Fall Risks - - - -  Follow up - - Falls evaluation completed - -    FALL RISK  PREVENTION PERTAINING TO THE HOME:  Any stairs in or around the home? {YES/NO:21197} If so, are there any without handrails? {YES/NO:21197} Home free of loose throw rugs in walkways, pet beds, electrical cords, etc? {YES/NO:21197} Adequate lighting in your home to reduce risk of falls? {YES/NO:21197}  ASSISTIVE DEVICES UTILIZED TO PREVENT FALLS:  Life alert? {YES/NO:21197} Use of a cane, walker or w/c? {YES/NO:21197} Grab bars in the bathroom? {YES/NO:21197} Shower chair or bench in shower? {YES/NO:21197} Elevated toilet seat or a handicapped toilet? {YES/NO:21197}  TIMED UP AND GO:  Was the test performed? {YES/NO:21197}.  Length of time to ambulate 10 feet: *** sec.   {Appearance of Gait:2101803}  Cognitive Function: MMSE - Mini Mental State Exam 04/24/2018 10/12/2016  Orientation to time 5 5  Orientation to Place 5 5  Registration 3 3  Attention/ Calculation 5 5  Recall 2 2  Language- name 2 objects 2 2  Language- repeat 1 1  Language- follow 3 step command 3 3  Language- read & follow direction 1 1  Write a sentence 1 1  Copy design 1 1  Total score 29 29        Immunizations Immunization History  Administered Date(s) Administered  . Fluad Quad(high Dose 65+) 06/06/2019, 05/25/2020  . Influenza Split 05/08/2012  . Influenza Whole 06/18/2008  . Influenza, High Dose Seasonal PF 04/24/2018  . Influenza,inj,Quad PF,6+ Mos 04/28/2015, 08/22/2016, 10/08/2017  . Janssen (J&J) SARS-COV-2 Vaccination 10/06/2019  . Pneumococcal Conjugate-13 04/28/2015  . Pneumococcal Polysaccharide-23 05/08/2012, 06/06/2019    TDAP status: Due, Education has been provided regarding the importance of this vaccine. Advised may receive this vaccine at local pharmacy or Health Dept. Aware to provide a copy of the vaccination record if obtained from local pharmacy or Health Dept. Verbalized acceptance and understanding.  Flu Vaccine status: Up to date  Pneumococcal vaccine status: Up to  date  {Covid-19 vaccine status:2101808}  Qualifies for Shingles Vaccine? Yes   Zostavax completed No   Shingrix Completed?: No.    Education has been provided regarding the importance of this vaccine. Patient has been advised to call insurance company to determine out of pocket expense if they have not yet received this vaccine. Advised may also receive vaccine at local pharmacy or Health Dept. Verbalized acceptance and understanding.  Screening Tests Health Maintenance  Topic Date Due  . TETANUS/TDAP  Never done  . COVID-19 Vaccine (2 - Booster for [employer] series) 12/01/2019  . MAMMOGRAM  07/08/2020  . COLONOSCOPY (Pts 45-103yrs Insurance coverage will need to be confirmed)  04/01/2025  . INFLUENZA VACCINE  Completed  . DEXA SCAN  Completed  . Hepatitis C Screening  Completed  . PNA vac Low Risk Adult  Completed    Health Maintenance  Health Maintenance Due  Topic Date Due  . TETANUS/TDAP  Never done  . COVID-19 Vaccine (2 - Booster for [employer] series) 12/01/2019  . MAMMOGRAM  07/08/2020    Colorectal cancer screening: Type of screening: Colonoscopy. Completed 04/01/2020. Repeat every 5 years  {Mammogram status:21018020}  {Bone Density status:21018021}  Lung Cancer Screening: (Low Dose CT Chest recommended if Age 6-80 years, 30 pack-year currently smoking OR have quit w/in 15years.) does qualify.   Lung Cancer Screening Referral: ***  Additional Screening:  Hepatitis C Screening:  Completed 05/25/2020  Vision Screening: Recommended annual ophthalmology exams for early detection of glaucoma and other disorders of the eye. Is the patient up to date with their annual eye exam?  {YES/NO:21197} Who is the provider or what is the name of the office in which the patient attends annual eye exams? *** If pt is not established with a provider, would they like to be referred to a provider to establish care? {YES/NO:21197}.   Dental Screening: Recommended annual dental exams for  proper oral hygiene  Community Resource Referral / Chronic Care Management: CRR required this visit?  {YES/NO:21197}  CCM required this visit?  {YES/NO:21197}     Plan:     I have personally reviewed and noted the following in the patient's chart:   . Medical and social history . Use of alcohol, tobacco or illicit drugs  . Current medications and supplements . Functional ability and status . Nutritional status . Physical activity . Advanced directives . List of other physicians . Hospitalizations, surgeries, and ER visits in previous 12 months . Vitals . Screenings to include cognitive, depression, and falls . Referrals and appointments  In addition, I have reviewed and discussed with patient certain preventive protocols, quality metrics, and best practice recommendations. A written personalized care plan for preventive services as well as general preventive health recommendations were provided to patient.   Due to this being a telephonic visit, the after visit summary with patients personalized plan was offered to patient via mail or my-chart. ***Patient declined at this time./ Patient would like to access on my-chart/ per request, patient was mailed a copy of AVS./ Patient preferred to pick up at office at next visit.   Marta Antu, LPN   [id number]  Nurse Health Advisor  Nurse Notes: ***

## 2020-08-04 ENCOUNTER — Other Ambulatory Visit: Payer: Self-pay | Admitting: Family Medicine

## 2020-08-11 ENCOUNTER — Other Ambulatory Visit: Payer: Self-pay | Admitting: Family Medicine

## 2020-08-11 DIAGNOSIS — G8929 Other chronic pain: Secondary | ICD-10-CM

## 2020-08-11 DIAGNOSIS — M549 Dorsalgia, unspecified: Secondary | ICD-10-CM

## 2020-08-11 NOTE — Telephone Encounter (Signed)
LR: 03/31/2020 Qty: 60 with 0 refills  Last office visit: 06/18/2020 Upcoming appointment: 11/23/2020

## 2020-08-11 NOTE — Telephone Encounter (Signed)
Pt called in asking for a refill on the hydrocodone, pt uses [facility] in [place]. She is aware that Dr. Birdie Riddle is out of the office this week.

## 2020-08-12 DIAGNOSIS — M75102 Unspecified rotator cuff tear or rupture of left shoulder, not specified as traumatic: Secondary | ICD-10-CM | POA: Diagnosis not present

## 2020-08-12 MED ORDER — HYDROCODONE-ACETAMINOPHEN 5-325 MG PO TABS
1.0000 | ORAL_TABLET | Freq: Four times a day (QID) | ORAL | 0 refills | Status: DC | PRN
Start: 2020-08-12 — End: 2020-12-28

## 2020-08-15 ENCOUNTER — Other Ambulatory Visit: Payer: Self-pay | Admitting: Family Medicine

## 2020-08-15 DIAGNOSIS — E559 Vitamin D deficiency, unspecified: Secondary | ICD-10-CM

## 2020-08-19 DIAGNOSIS — H35363 Drusen (degenerative) of macula, bilateral: Secondary | ICD-10-CM | POA: Diagnosis not present

## 2020-09-05 ENCOUNTER — Other Ambulatory Visit: Payer: Self-pay | Admitting: Family Medicine

## 2020-09-05 DIAGNOSIS — E785 Hyperlipidemia, unspecified: Secondary | ICD-10-CM

## 2020-09-06 ENCOUNTER — Other Ambulatory Visit: Payer: Self-pay

## 2020-09-06 DIAGNOSIS — Z01812 Encounter for preprocedural laboratory examination: Secondary | ICD-10-CM | POA: Diagnosis not present

## 2020-09-06 DIAGNOSIS — I10 Essential (primary) hypertension: Secondary | ICD-10-CM

## 2020-09-06 DIAGNOSIS — Z20822 Contact with and (suspected) exposure to covid-19: Secondary | ICD-10-CM | POA: Diagnosis not present

## 2020-09-06 MED ORDER — LISINOPRIL 30 MG PO TABS: 30.0000 mg | ORAL_TABLET | Freq: Every day | ORAL | 0 refills | Status: DC

## 2020-09-09 DIAGNOSIS — Z885 Allergy status to narcotic agent status: Secondary | ICD-10-CM | POA: Diagnosis not present

## 2020-09-09 DIAGNOSIS — F17290 Nicotine dependence, other tobacco product, uncomplicated: Secondary | ICD-10-CM | POA: Diagnosis not present

## 2020-09-09 DIAGNOSIS — M24111 Other articular cartilage disorders, right shoulder: Secondary | ICD-10-CM | POA: Diagnosis not present

## 2020-09-09 DIAGNOSIS — E039 Hypothyroidism, unspecified: Secondary | ICD-10-CM | POA: Diagnosis not present

## 2020-09-09 DIAGNOSIS — M75112 Incomplete rotator cuff tear or rupture of left shoulder, not specified as traumatic: Secondary | ICD-10-CM | POA: Diagnosis not present

## 2020-09-09 DIAGNOSIS — Z882 Allergy status to sulfonamides status: Secondary | ICD-10-CM | POA: Diagnosis not present

## 2020-09-09 DIAGNOSIS — Z8673 Personal history of transient ischemic attack (TIA), and cerebral infarction without residual deficits: Secondary | ICD-10-CM | POA: Diagnosis not present

## 2020-09-09 DIAGNOSIS — Z9889 Other specified postprocedural states: Secondary | ICD-10-CM | POA: Diagnosis not present

## 2020-09-09 DIAGNOSIS — I10 Essential (primary) hypertension: Secondary | ICD-10-CM | POA: Diagnosis not present

## 2020-09-09 DIAGNOSIS — E119 Type 2 diabetes mellitus without complications: Secondary | ICD-10-CM | POA: Diagnosis not present

## 2020-09-09 DIAGNOSIS — M25512 Pain in left shoulder: Secondary | ICD-10-CM | POA: Diagnosis not present

## 2020-09-09 DIAGNOSIS — Z8616 Personal history of COVID-19: Secondary | ICD-10-CM | POA: Diagnosis not present

## 2020-09-09 DIAGNOSIS — E785 Hyperlipidemia, unspecified: Secondary | ICD-10-CM | POA: Diagnosis not present

## 2020-09-09 DIAGNOSIS — Z888 Allergy status to other drugs, medicaments and biological substances status: Secondary | ICD-10-CM | POA: Diagnosis not present

## 2020-09-09 DIAGNOSIS — G43909 Migraine, unspecified, not intractable, without status migrainosus: Secondary | ICD-10-CM | POA: Diagnosis not present

## 2020-09-09 DIAGNOSIS — Z886 Allergy status to analgesic agent status: Secondary | ICD-10-CM | POA: Diagnosis not present

## 2020-09-09 DIAGNOSIS — K219 Gastro-esophageal reflux disease without esophagitis: Secondary | ICD-10-CM | POA: Diagnosis not present

## 2020-09-09 DIAGNOSIS — M7552 Bursitis of left shoulder: Secondary | ICD-10-CM | POA: Diagnosis not present

## 2020-09-09 DIAGNOSIS — G8918 Other acute postprocedural pain: Secondary | ICD-10-CM | POA: Diagnosis not present

## 2020-09-09 DIAGNOSIS — M19012 Primary osteoarthritis, left shoulder: Secondary | ICD-10-CM | POA: Diagnosis not present

## 2020-09-09 DIAGNOSIS — Z79899 Other long term (current) drug therapy: Secondary | ICD-10-CM | POA: Diagnosis not present

## 2020-09-09 DIAGNOSIS — E669 Obesity, unspecified: Secondary | ICD-10-CM | POA: Diagnosis not present

## 2020-09-09 DIAGNOSIS — Z6838 Body mass index (BMI) 38.0-38.9, adult: Secondary | ICD-10-CM | POA: Diagnosis not present

## 2020-09-09 DIAGNOSIS — M7542 Impingement syndrome of left shoulder: Secondary | ICD-10-CM | POA: Diagnosis not present

## 2020-09-09 DIAGNOSIS — M75102 Unspecified rotator cuff tear or rupture of left shoulder, not specified as traumatic: Secondary | ICD-10-CM | POA: Diagnosis not present

## 2020-09-09 DIAGNOSIS — G4730 Sleep apnea, unspecified: Secondary | ICD-10-CM | POA: Diagnosis not present

## 2020-09-09 DIAGNOSIS — I6523 Occlusion and stenosis of bilateral carotid arteries: Secondary | ICD-10-CM | POA: Diagnosis not present

## 2020-09-20 DIAGNOSIS — Z4789 Encounter for other orthopedic aftercare: Secondary | ICD-10-CM | POA: Diagnosis not present

## 2020-09-21 DIAGNOSIS — M25612 Stiffness of left shoulder, not elsewhere classified: Secondary | ICD-10-CM | POA: Diagnosis not present

## 2020-09-21 DIAGNOSIS — M25611 Stiffness of right shoulder, not elsewhere classified: Secondary | ICD-10-CM | POA: Diagnosis not present

## 2020-09-21 DIAGNOSIS — R293 Abnormal posture: Secondary | ICD-10-CM | POA: Diagnosis not present

## 2020-09-21 DIAGNOSIS — M6281 Muscle weakness (generalized): Secondary | ICD-10-CM | POA: Diagnosis not present

## 2020-09-21 DIAGNOSIS — Z4789 Encounter for other orthopedic aftercare: Secondary | ICD-10-CM | POA: Diagnosis not present

## 2020-09-21 DIAGNOSIS — M25512 Pain in left shoulder: Secondary | ICD-10-CM | POA: Diagnosis not present

## 2020-09-27 DIAGNOSIS — M25612 Stiffness of left shoulder, not elsewhere classified: Secondary | ICD-10-CM | POA: Diagnosis not present

## 2020-09-27 DIAGNOSIS — M25512 Pain in left shoulder: Secondary | ICD-10-CM | POA: Diagnosis not present

## 2020-09-27 DIAGNOSIS — Z4789 Encounter for other orthopedic aftercare: Secondary | ICD-10-CM | POA: Diagnosis not present

## 2020-09-27 DIAGNOSIS — M6281 Muscle weakness (generalized): Secondary | ICD-10-CM | POA: Diagnosis not present

## 2020-09-27 DIAGNOSIS — M25611 Stiffness of right shoulder, not elsewhere classified: Secondary | ICD-10-CM | POA: Diagnosis not present

## 2020-09-27 DIAGNOSIS — R293 Abnormal posture: Secondary | ICD-10-CM | POA: Diagnosis not present

## 2020-10-07 DIAGNOSIS — M25512 Pain in left shoulder: Secondary | ICD-10-CM | POA: Insufficient documentation

## 2020-10-08 DIAGNOSIS — R6889 Other general symptoms and signs: Secondary | ICD-10-CM | POA: Diagnosis not present

## 2020-10-08 DIAGNOSIS — M25512 Pain in left shoulder: Secondary | ICD-10-CM | POA: Diagnosis not present

## 2020-10-08 DIAGNOSIS — Z9889 Other specified postprocedural states: Secondary | ICD-10-CM | POA: Diagnosis not present

## 2020-10-13 DIAGNOSIS — M25512 Pain in left shoulder: Secondary | ICD-10-CM | POA: Diagnosis not present

## 2020-10-13 DIAGNOSIS — R6889 Other general symptoms and signs: Secondary | ICD-10-CM | POA: Diagnosis not present

## 2020-10-20 DIAGNOSIS — R6889 Other general symptoms and signs: Secondary | ICD-10-CM | POA: Diagnosis not present

## 2020-10-20 DIAGNOSIS — M25512 Pain in left shoulder: Secondary | ICD-10-CM | POA: Diagnosis not present

## 2020-10-27 DIAGNOSIS — G4733 Obstructive sleep apnea (adult) (pediatric): Secondary | ICD-10-CM | POA: Diagnosis not present

## 2020-11-01 ENCOUNTER — Ambulatory Visit (INDEPENDENT_AMBULATORY_CARE_PROVIDER_SITE_OTHER): Payer: PPO

## 2020-11-01 ENCOUNTER — Other Ambulatory Visit: Payer: Self-pay | Admitting: Family Medicine

## 2020-11-01 VITALS — Ht 64.0 in | Wt 234.0 lb

## 2020-11-01 DIAGNOSIS — Z0000 Encounter for general adult medical examination without abnormal findings: Secondary | ICD-10-CM | POA: Diagnosis not present

## 2020-11-01 NOTE — Progress Notes (Signed)
Subjective:   Ashley Woods is a 75 y.o. female who presents for Medicare Annual (Subsequent) preventive examination.  I connected with Ashley Woods today by telephone and verified that I am speaking with the correct person using two identifiers. Location patient: home Location provider: work Persons participating in the virtual visit: patient, Ashley Woods.    I discussed the limitations, risks, security and privacy concerns of performing an evaluation and management service by telephone and the availability of in person appointments. I also discussed with the patient that there may be a patient responsible charge related to this service. The patient expressed understanding and verbally consented to this telephonic visit.    Interactive audio and video telecommunications were attempted between this provider and patient, however failed, due to patient having technical difficulties OR patient did not have access to video capability.  We continued and completed visit with audio only.  Some vital signs may be absent or patient reported.   Time Spent with patient on telephone encounter: 25 minutes   Review of Systems     Cardiac Risk Factors include: advanced age (>11men, >77 women);diabetes mellitus;hypertension;obesity (BMI >30kg/m2)     Objective:    Today's Vitals   11/01/20 1114  Weight: 234 lb (106.1 kg)  Height: 5\' 4"  (1.626 m)   Body mass index is 40.17 kg/m.  Advanced Directives 11/01/2020 04/24/2018 09/13/2017 10/12/2016  Does Patient Have a Medical Advance Directive? Yes Yes Yes No  Type of Paramedic of [place];Living will Living will;Healthcare Power of Attorney Living will -  Copy of [place] in Chart? No - copy requested No - copy requested - -  Would patient like information on creating a medical advance directive? - - - No - Patient declined    Current Medications (verified) Outpatient Encounter Medications as of 11/01/2020   Medication Sig  . albuterol (PROVENTIL HFA;VENTOLIN HFA) 108 (90 Base) MCG/ACT inhaler Inhale into the lungs.  . calcium-vitamin D (OSCAL WITH D) 500-200 MG-UNIT tablet Take by mouth.  . clonazePAM (KLONOPIN) 0.5 MG tablet Take 1 tablet (0.5 mg total) by mouth at bedtime as needed.  . cyanocobalamin (,VITAMIN B-12,) 1000 MCG/ML injection INJECT 1 ML INTO THE MUSCLE EVERY 30 DAYS  . furosemide (LASIX) 20 MG tablet Take 2 tablets (40 mg total) by mouth daily.  Marland Kitchen HYDROcodone-acetaminophen (NORCO/VICODIN) 5-325 MG tablet Take 1 tablet by mouth every 6 (six) hours as needed.  Marland Kitchen lisinopril (ZESTRIL) 30 MG tablet Take 1 tablet (30 mg total) by mouth daily.  Marland Kitchen loperamide (IMODIUM A-D) 2 MG tablet Take 2 mg by mouth.   . RESTASIS 0.05 % ophthalmic emulsion Place 1 drop into both eyes 2 (two) times daily.   . simvastatin (ZOCOR) 40 MG tablet TAKE 1 TABLET(40 MG) BY MOUTH AT BEDTIME  . SYRINGE-NEEDLE, DISP, 3 ML (BD SAFETYGLIDE SYRINGE/NEEDLE) 25G X 1" 3 ML MISC Please use one syringe and needle for each injection.  . Vitamin D, Ergocalciferol, (DRISDOL) 1.25 MG (50000 UNIT) CAPS capsule Take 1 capsule (50,000 Units total) by mouth every 7 (seven) days.  . [DISCONTINUED] citalopram (CELEXA) 40 MG tablet TAKE 1 TABLET(40 MG) BY MOUTH DAILY  . [DISCONTINUED] levothyroxine (SYNTHROID) 100 MCG tablet TAKE 1 TABLET(100 MCG) BY MOUTH DAILY  . [DISCONTINUED] omeprazole (PRILOSEC) 40 MG capsule Take 1 capsule (40 mg total) by mouth daily.  . [DISCONTINUED] oxybutynin (DITROPAN) 5 MG tablet Take 1 tablet (5 mg total) by mouth 2 (two) times daily.   No facility-administered encounter  medications on file as of 11/01/2020.    Allergies (verified) Bactrim [sulfamethoxazole-trimethoprim], Naproxen, Trimethoprim, Sulfamethoxazole-trimethoprim, Tramadol, Tramadol hcl, and Zanaflex [tizanidine]   History: Past Medical History:  Diagnosis Date  . Anxiety    on meds  . Asthma    occassionally  . Cataract     bilateral-sx   . Colon polyp    adenomatous  . Colon polyps   . COPD (chronic obstructive pulmonary disease) (Ashley Woods)   . Depression    on meds  . Diverticulosis   . GERD (gastroesophageal reflux disease)    on meds  . Hemorrhoids   . Hyperlipidemia    on meds  . Hypertension    on meds  . IBS (irritable bowel syndrome)   . Macular degeneration    Dr. Hoyle Woods  . Overactive bladder   . Right arm fracture    2018  . Rosacea   . Sleep apnea    uses cpap  . Stroke Ashley Woods)    hx of numerous TIA per MRI  . Thyroid disease    on meds   Past Surgical History:  Procedure Laterality Date  . BLADDER SURGERY  2015  . CATARACT EXTRACTION, BILATERAL  2016  . CERVICAL FUSION  2000  . COLONOSCOPY  2013   TA  . HERNIA REPAIR  5027   umbilical  . HYSTERECTOMY ABDOMINAL WITH SALPINGO-OOPHORECTOMY  1995  . OOPHORECTOMY    . PARTIAL HYSTERECTOMY    . POLYPECTOMY  2013   TA  . SHOULDER SURGERY Right 09/2019  . TONSILLECTOMY  1960   Family History  Problem Relation Age of Onset  . Heart disease Daughter   . Ashley Woods syndrome Daughter   . Colon cancer Neg Hx   . Inflammatory bowel disease Neg Hx   . Esophageal cancer Neg Hx   . Stomach cancer Neg Hx   . Rectal cancer Neg Hx   . Colon polyps Neg Hx    Social History   Socioeconomic History  . Marital status: Married    Spouse name: Not on file  . Number of Woods: 2  . Years of education: Not on file  . Highest education level: Not on file  Occupational History  . Occupation: currently on disability  Tobacco Use  . Smoking status: Former Smoker    Packs/day: 1.00    Years: 45.00    Pack years: 45.00    Types: E-cigarettes, Cigarettes    Quit date: 04/30/2013    Years since quitting: 7.5  . Smokeless tobacco: Never Used  . Tobacco comment: former 1ppd x45 years, 09-13-17 report vaping  Vaping Use  . Vaping Use: Some days  Substance and Sexual Activity  . Alcohol use: No  . Drug use: No  . Sexual  activity: Not on file  Other Topics Concern  . Not on file  Social History Narrative   Husband Ashley Woods on disability.    Former housekeeper   1 year of college   Social Determinants of Radio broadcast assistant Strain: [place]   . Difficulty of Paying Living Expenses: Not hard at all  Food Insecurity: No Food Insecurity  . Worried About Charity fundraiser in the Last Year: Never true  . Ran Out of Food in the Last Year: Never true  Transportation Needs: No Transportation Needs  . Lack of Transportation (Medical): No  . Lack of Transportation (Non-Medical): No  Physical Activity: Insufficiently Active  . Days of Exercise per Week:  3 days  . Minutes of Exercise per Session: 40 min  Stress: No Stress Concern Present  . Feeling of Stress : Not at all  Social Connections: Moderately Isolated  . Frequency of Communication with Friends and Family: More than three times a week  . Frequency of Social Gatherings with Friends and Family: More than three times a week  . Attends Religious Services: Never  . Active Member of Clubs or Organizations: No  . Attends Archivist Meetings: Never  . Marital Status: Married    Tobacco Counseling Counseling given: Not Answered Comment: former 1ppd x45 years, 09-13-17 report vaping   Clinical Intake:  Pre-visit preparation completed: Yes  Pain : No/denies pain     Nutritional Status: BMI > 30  Obese Nutritional Risks: None Diabetes: No  How often do you need to have someone help you when you read instructions, pamphlets, or other written materials from your doctor or pharmacy?: 1 - Never  Diabetic?No  Interpreter Needed?: No  Information entered by :: Caroleen Hamman LPN   Activities of Daily Living In your present state of health, do you have any difficulty performing the following activities: 11/01/2020 05/25/2020  Hearing? N Y  Vision? N N  Difficulty concentrating or making decisions? Y N  Comment occasionally -   Walking or climbing stairs? N N  Dressing or bathing? N N  Doing errands, shopping? N N  Preparing Food and eating ? N -  Using the Toilet? N -  In the past six months, have you accidently leaked urine? Y -  Do you have problems with loss of bowel control? Y -  Managing your Medications? N -  Managing your Finances? N -  Housekeeping or managing your Housekeeping? N -  Some recent data might be hidden    Patient Care Team: Midge Minium, MD as PCP - General Irene Shipper, MD as Consulting Physician (Gastroenterology) Renelda Loma, OD as Consulting Physician (Optometry) Landry Mellow, Candace (Obstetrics and Gynecology) Franchot Mimes, Teodora Medici, MD as Referring Physician (Pulmonary Disease)  Indicate any recent Medical Services you may have received from other than [facility] providers in the past year (date may be approximate).     Assessment:   This is a routine wellness examination for [facility].  Hearing/Vision screen  Hearing Screening   125Hz  250Hz  500Hz  1000Hz  2000Hz  3000Hz  4000Hz  6000Hz  8000Hz   Right ear:           Left ear:           Comments: Hearing aids  Vision Screening Comments: Wears glasses Last eye exam-04/2020-Dr. Jodi Mourning  Dietary issues and exercise activities discussed: Current Exercise Habits: Home exercise routine, Type of exercise: walking, Time (Minutes): 40, Frequency (Times/Week): 3, Weekly Exercise (Minutes/Week): 120, Intensity: Mild, Exercise limited by: None identified  Goals    . Patient Stated     Drink more water      Depression Screen PHQ 2/9 Scores 11/01/2020 05/25/2020 06/06/2019 09/02/2018 04/24/2018 10/08/2017 02/14/2017  PHQ - 2 Score 0 0 0 0 1 0 0  PHQ- 9 Score - 0 - - 7 0 0  Exception Documentation - - - - - - -    Fall Risk Fall Risk  11/01/2020 05/25/2020 01/06/2020 06/06/2019 09/02/2018  Falls in the past year? 0 0 0 0 0  Comment - - - - -  Number falls in past yr: 0 0 0 0 0  Injury with Fall? 0 0 0 0 0  Risk for fall due  to : - No Fall  Risks - - -  Follow up Falls prevention discussed - - Falls evaluation completed -    FALL RISK PREVENTION PERTAINING TO THE HOME:  Any stairs in or around the home? No  Home free of loose throw rugs in walkways, pet beds, electrical cords, etc? Yes  Adequate lighting in your home to reduce risk of falls? Yes   ASSISTIVE DEVICES UTILIZED TO PREVENT FALLS:  Life alert? No  Use of a cane, walker or w/c? No  Grab bars in the bathroom? Yes  Shower chair or bench in shower? No  Elevated toilet seat or a handicapped toilet? No   TIMED UP AND GO:  Was the test performed? No . Phone visit   Cognitive Function: MMSE - Mini Mental State Exam 04/24/2018 10/12/2016  Orientation to time 5 5  Orientation to Place 5 5  Registration 3 3  Attention/ Calculation 5 5  Recall 2 2  Language- name 2 objects 2 2  Language- repeat 1 1  Language- follow 3 step command 3 3  Language- read & follow direction 1 1  Write a sentence 1 1  Copy design 1 1  Total score 29 29     6CIT Screen 11/01/2020  What Year? 0 points  What month? 0 points  What time? 0 points  Count back from 20 0 points  Months in reverse 0 points  Repeat phrase 2 points  Total Score 2    Immunizations Immunization History  Administered Date(s) Administered  . Fluad Quad(high Dose 65+) 06/06/2019, 05/25/2020  . Influenza Split 05/08/2012  . Influenza Whole 06/18/2008  . Influenza, High Dose Seasonal PF 04/24/2018  . Influenza,inj,Quad PF,6+ Mos 04/28/2015, 08/22/2016, 10/08/2017  . Janssen (J&J) SARS-COV-2 Vaccination 10/06/2019  . Pneumococcal Conjugate-13 04/28/2015  . Pneumococcal Polysaccharide-23 05/08/2012, 06/06/2019    TDAP status: Due, Education has been provided regarding the importance of this vaccine. Advised may receive this vaccine at local pharmacy or Health Dept. Aware to provide a copy of the vaccination record if obtained from local pharmacy or Health Dept. Verbalized acceptance and  understanding.  Flu Vaccine status: Up to date  Pneumococcal vaccine status: Up to date  Covid-19 vaccine status: Information provided on how to obtain vaccines. Booster due  Qualifies for Shingles Vaccine? Yes   Zostavax completed No   Shingrix Completed?: No.    Education has been provided regarding the importance of this vaccine. Patient has been advised to call insurance company to determine out of pocket expense if they have not yet received this vaccine. Advised may also receive vaccine at local pharmacy or Health Dept. Verbalized acceptance and understanding.  Screening Tests Health Maintenance  Topic Date Due  . TETANUS/TDAP  Never done  . COVID-19 Vaccine (2 - Booster for [employer] series) 12/01/2019  . MAMMOGRAM  07/08/2020  . INFLUENZA VACCINE  02/28/2021  . COLONOSCOPY (Pts 45-14yrs Insurance coverage will need to be confirmed)  04/01/2025  . DEXA SCAN  Completed  . Hepatitis C Screening  Completed  . PNA vac Low Risk Adult  Completed  . HPV VACCINES  Aged Out    Health Maintenance  Health Maintenance Due  Topic Date Due  . TETANUS/TDAP  Never done  . COVID-19 Vaccine (2 - Booster for [employer] series) 12/01/2019  . MAMMOGRAM  07/08/2020    Colorectal cancer screening: Type of screening: Colonoscopy. Completed 04/01/2020. Repeat every 5 years  Mammogram status: Due-Patient declined today.  Bone density status:Due-Patient declined today.  Lung Cancer Screening: (Low Dose CT Chest recommended if Age 45-80 years, 30 pack-year currently smoking OR have quit w/in 15years.) does not qualify.    Additional Screening:  Hepatitis C Screening:  Completed 05/25/2020  Vision Screening: Recommended annual ophthalmology exams for early detection of glaucoma and other disorders of the eye. Is the patient up to date with their annual eye exam?  Yes  Who is the provider or what is the name of the office in which the patient attends annual eye exams? Dr. Jodi Mourning .   Dental  Screening: Recommended annual dental exams for proper oral hygiene  Community Resource Referral / Chronic Care Management: CRR required this visit?  No   CCM required this visit?  No      Plan:     I have personally reviewed and noted the following in the patient's chart:   . Medical and social history . Use of alcohol, tobacco or illicit drugs  . Current medications and supplements . Functional ability and status . Nutritional status . Physical activity . Advanced directives . List of other physicians . Hospitalizations, surgeries, and ER visits in previous 12 months . Vitals . Screenings to include cognitive, depression, and falls . Referrals and appointments  In addition, I have reviewed and discussed with patient certain preventive protocols, quality metrics, and best practice recommendations. A written personalized care plan for preventive services as well as general preventive health recommendations were provided to patient.   Due to this being a telephonic visit, the after visit summary with patients personalized plan was offered to patient via mail or my-chart.  Per request, patient was mailed a copy of [place], LPN   [id number]  Nurse Health Advisor  Nurse Notes: None

## 2020-11-01 NOTE — Patient Instructions (Signed)
Ashley Woods , Thank you for taking time to complete your Medicare Wellness Visit. I appreciate your ongoing commitment to your health goals. Please review the following plan we discussed and let me know if I can assist you in the future.   Screening recommendations/referrals: Colonoscopy: Completed 04/01/2020-Please follow GI recommendations for follow up. Mammogram: Due- Declined today. Please call when you are ready to schedule. Bone Density: Due- Declined today. Please call when you are ready to schedule. Recommended yearly ophthalmology/optometry visit for glaucoma screening and checkup Recommended yearly dental visit for hygiene and checkup  Vaccinations: Influenza vaccine: Up to date Pneumococcal vaccine: Completed vaccines Tdap vaccine: Discuss with pharmacy Shingles vaccine: Discuss with pharmacy   Covid-19:Booster due-Discuss with pharmacy  Advanced directives: Please bring a copy for your chart  Conditions/risks identified: See problem list  Next appointment: Follow up in one year for your annual wellness visit     Preventive Care 65 Years and Older, Female Preventive care refers to lifestyle choices and visits with your health care provider that can promote health and wellness. What does preventive care include?  A yearly physical exam. This is also called an annual well check.  Dental exams once or twice a year.  Routine eye exams. Ask your health care provider how often you should have your eyes checked.  Personal lifestyle choices, including:  Daily care of your teeth and gums.  Regular physical activity.  Eating a healthy diet.  Avoiding tobacco and drug use.  Limiting alcohol use.  Practicing safe sex.  Taking low-dose aspirin every day.  Taking vitamin and mineral supplements as recommended by your health care provider. What happens during an annual well check? The services and screenings done by your health care provider during your annual well  check will depend on your age, overall health, lifestyle risk factors, and family history of disease. Counseling  Your health care provider may ask you questions about your:  Alcohol use.  Tobacco use.  Drug use.  Emotional well-being.  Home and relationship well-being.  Sexual activity.  Eating habits.  History of falls.  Memory and ability to understand (cognition).  Work and work Statistician.  Reproductive health. Screening  You may have the following tests or measurements:  Height, weight, and BMI.  Blood pressure.  Lipid and cholesterol levels. These may be checked every 5 years, or more frequently if you are over 61 years old.  Skin check.  Lung cancer screening. You may have this screening every year starting at age 2 if you have a 30-pack-year history of smoking and currently smoke or have quit within the past 15 years.  Fecal occult blood test (FOBT) of the stool. You may have this test every year starting at age 15.  Flexible sigmoidoscopy or colonoscopy. You may have a sigmoidoscopy every 5 years or a colonoscopy every 10 years starting at age 46.  Hepatitis C blood test.  Hepatitis B blood test.  Sexually transmitted disease (STD) testing.  Diabetes screening. This is done by checking your blood sugar (glucose) after you have not eaten for a while (fasting). You may have this done every 1-3 years.  Bone density scan. This is done to screen for osteoporosis. You may have this done starting at age 50.  Mammogram. This may be done every 1-2 years. Talk to your health care provider about how often you should have regular mammograms. Talk with your health care provider about your test results, treatment options, and if necessary, the need for more  tests. Vaccines  Your health care provider may recommend certain vaccines, such as:  Influenza vaccine. This is recommended every year.  Tetanus, diphtheria, and acellular pertussis (Tdap, Td) vaccine. You  may need a Td booster every 10 years.  Zoster vaccine. You may need this after age 34.  Pneumococcal 13-valent conjugate (PCV13) vaccine. One dose is recommended after age 48.  Pneumococcal polysaccharide (PPSV23) vaccine. One dose is recommended after age 38. Talk to your health care provider about which screenings and vaccines you need and how often you need them. This information is not intended to replace advice given to you by your health care provider. Make sure you discuss any questions you have with your health care provider. Document Released: 08/13/2015 Document Revised: 04/05/2016 Document Reviewed: 05/18/2015 Elsevier Interactive Patient Education  2017 [place] Prevention in the Home Falls can cause injuries. They can happen to people of all ages. There are many things you can do to make your home safe and to help prevent falls. What can I do on the outside of my home?  Regularly fix the edges of walkways and driveways and fix any cracks.  Remove anything that might make you trip as you walk through a door, such as a raised step or threshold.  Trim any bushes or trees on the path to your home.  Use bright outdoor lighting.  Clear any walking paths of anything that might make someone trip, such as rocks or tools.  Regularly check to see if handrails are loose or broken. Make sure that both sides of any steps have handrails.  Any raised decks and porches should have guardrails on the edges.  Have any leaves, snow, or ice cleared regularly.  Use sand or salt on walking paths during winter.  Clean up any spills in your garage right away. This includes oil or grease spills. What can I do in the bathroom?  Use night lights.  Install grab bars by the toilet and in the tub and shower. Do not use towel bars as grab bars.  Use non-skid mats or decals in the tub or shower.  If you need to sit down in the shower, use a plastic, non-slip stool.  Keep the floor  dry. Clean up any water that spills on the floor as soon as it happens.  Remove soap buildup in the tub or shower regularly.  Attach bath mats securely with double-sided non-slip rug tape.  Do not have throw rugs and other things on the floor that can make you trip. What can I do in the bedroom?  Use night lights.  Make sure that you have a light by your bed that is easy to reach.  Do not use any sheets or blankets that are too big for your bed. They should not hang down onto the floor.  Have a firm chair that has side arms. You can use this for support while you get dressed.  Do not have throw rugs and other things on the floor that can make you trip. What can I do in the kitchen?  Clean up any spills right away.  Avoid walking on wet floors.  Keep items that you use a lot in easy-to-reach places.  If you need to reach something above you, use a strong step stool that has a grab bar.  Keep electrical cords out of the way.  Do not use floor polish or wax that makes floors slippery. If you must use wax, use non-skid floor  wax.  Do not have throw rugs and other things on the floor that can make you trip. What can I do with my stairs?  Do not leave any items on the stairs.  Make sure that there are handrails on both sides of the stairs and use them. Fix handrails that are broken or loose. Make sure that handrails are as long as the stairways.  Check any carpeting to make sure that it is firmly attached to the stairs. Fix any carpet that is loose or worn.  Avoid having throw rugs at the top or bottom of the stairs. If you do have throw rugs, attach them to the floor with carpet tape.  Make sure that you have a light switch at the top of the stairs and the bottom of the stairs. If you do not have them, ask someone to add them for you. What else can I do to help prevent falls?  Wear shoes that:  Do not have high heels.  Have rubber bottoms.  Are comfortable and fit you  well.  Are closed at the toe. Do not wear sandals.  If you use a stepladder:  Make sure that it is fully opened. Do not climb a closed stepladder.  Make sure that both sides of the stepladder are locked into place.  Ask someone to hold it for you, if possible.  Clearly mark and make sure that you can see:  Any grab bars or handrails.  First and last steps.  Where the edge of each step is.  Use tools that help you move around (mobility aids) if they are needed. These include:  Canes.  Walkers.  Scooters.  Crutches.  Turn on the lights when you go into a dark area. Replace any light bulbs as soon as they burn out.  Set up your furniture so you have a clear path. Avoid moving your furniture around.  If any of your floors are uneven, fix them.  If there are any pets around you, be aware of where they are.  Review your medicines with your doctor. Some medicines can make you feel dizzy. This can increase your chance of falling. Ask your doctor what other things that you can do to help prevent falls. This information is not intended to replace advice given to you by your health care provider. Make sure you discuss any questions you have with your health care provider. Document Released: 05/13/2009 Document Revised: 12/23/2015 Document Reviewed: 08/21/2014 Elsevier Interactive Patient Education  2017 [employer].

## 2020-11-03 DIAGNOSIS — R6889 Other general symptoms and signs: Secondary | ICD-10-CM | POA: Diagnosis not present

## 2020-11-03 DIAGNOSIS — M25512 Pain in left shoulder: Secondary | ICD-10-CM | POA: Diagnosis not present

## 2020-11-04 DIAGNOSIS — H43813 Vitreous degeneration, bilateral: Secondary | ICD-10-CM | POA: Diagnosis not present

## 2020-11-04 DIAGNOSIS — H31113 Age-related choroidal atrophy, bilateral: Secondary | ICD-10-CM | POA: Diagnosis not present

## 2020-11-04 DIAGNOSIS — H353133 Nonexudative age-related macular degeneration, bilateral, advanced atrophic without subfoveal involvement: Secondary | ICD-10-CM | POA: Diagnosis not present

## 2020-11-10 ENCOUNTER — Other Ambulatory Visit: Payer: Self-pay | Admitting: Family Medicine

## 2020-11-10 DIAGNOSIS — M25512 Pain in left shoulder: Secondary | ICD-10-CM | POA: Diagnosis not present

## 2020-11-10 DIAGNOSIS — R6889 Other general symptoms and signs: Secondary | ICD-10-CM | POA: Diagnosis not present

## 2020-11-11 ENCOUNTER — Telehealth: Payer: Self-pay

## 2020-11-11 MED ORDER — CLONAZEPAM 0.5 MG PO TABS: 0.5000 mg | ORAL_TABLET | Freq: Every evening | ORAL | 0 refills | Status: DC | PRN

## 2020-11-11 NOTE — Telephone Encounter (Signed)
Refill provided at pt request

## 2020-11-11 NOTE — Telephone Encounter (Signed)
Requesting:Clonazepam 0.5mg  Contract: UDS: Last Visit:05/25/20 Next Visit:11/23/20 Last Refill:03/31/20 30 tabs 0 refills  Please Advise

## 2020-11-11 NOTE — Addendum Note (Signed)
Addended by: Midge Minium on: 11/11/2020 12:36 PM   Modules accepted: Orders

## 2020-11-17 DIAGNOSIS — R6889 Other general symptoms and signs: Secondary | ICD-10-CM | POA: Diagnosis not present

## 2020-11-17 DIAGNOSIS — M25512 Pain in left shoulder: Secondary | ICD-10-CM | POA: Diagnosis not present

## 2020-11-23 ENCOUNTER — Ambulatory Visit: Payer: PPO | Admitting: Family Medicine

## 2020-11-30 ENCOUNTER — Other Ambulatory Visit: Payer: Self-pay | Admitting: Family Medicine

## 2020-11-30 DIAGNOSIS — I10 Essential (primary) hypertension: Secondary | ICD-10-CM

## 2020-12-03 DIAGNOSIS — M25512 Pain in left shoulder: Secondary | ICD-10-CM | POA: Diagnosis not present

## 2020-12-03 DIAGNOSIS — R6889 Other general symptoms and signs: Secondary | ICD-10-CM | POA: Diagnosis not present

## 2020-12-04 ENCOUNTER — Other Ambulatory Visit: Payer: Self-pay | Admitting: Family Medicine

## 2020-12-04 DIAGNOSIS — E785 Hyperlipidemia, unspecified: Secondary | ICD-10-CM

## 2020-12-20 DIAGNOSIS — Z4789 Encounter for other orthopedic aftercare: Secondary | ICD-10-CM | POA: Diagnosis not present

## 2020-12-28 ENCOUNTER — Telehealth: Payer: Self-pay | Admitting: Family Medicine

## 2020-12-28 ENCOUNTER — Other Ambulatory Visit: Payer: Self-pay

## 2020-12-28 DIAGNOSIS — G8929 Other chronic pain: Secondary | ICD-10-CM

## 2020-12-28 DIAGNOSIS — M549 Dorsalgia, unspecified: Secondary | ICD-10-CM

## 2020-12-28 DIAGNOSIS — I10 Essential (primary) hypertension: Secondary | ICD-10-CM

## 2020-12-28 NOTE — Telephone Encounter (Signed)
Pt came in asking for a refill on the hydrocodone and lisinopril. She has an appt on 01/07/21 and she uses [facility] on [address]  She is aware that Dr. Birdie Riddle isn't in the office today.

## 2020-12-28 NOTE — Telephone Encounter (Signed)
Request sent to pcp for approval.

## 2020-12-28 NOTE — Telephone Encounter (Signed)
Pt came in asking for a refill on the hydrocodone and lisinopril. She has an appt on 01/07/21 and she uses [facility] on [address]  She is aware that Dr. Birdie Riddle isn't in the office today.  hydrocodoneLFD 08/12/20 #60 with no refills Lisinopril LFD 11/30/20 #30 with no refills, was a courtesy refill because patient needed an appointment LOV 05/25/20 NOV 01/07/21

## 2020-12-29 ENCOUNTER — Ambulatory Visit: Payer: PPO | Admitting: Family Medicine

## 2020-12-29 MED ORDER — HYDROCODONE-ACETAMINOPHEN 5-325 MG PO TABS: 1.0000 | ORAL_TABLET | Freq: Four times a day (QID) | ORAL | 0 refills | Status: DC | PRN

## 2020-12-29 MED ORDER — LISINOPRIL 30 MG PO TABS: ORAL_TABLET | ORAL | 0 refills | Status: DC

## 2021-01-04 ENCOUNTER — Other Ambulatory Visit: Payer: Self-pay | Admitting: Family Medicine

## 2021-01-04 DIAGNOSIS — I10 Essential (primary) hypertension: Secondary | ICD-10-CM

## 2021-01-07 ENCOUNTER — Encounter: Payer: Self-pay | Admitting: Family Medicine

## 2021-01-07 ENCOUNTER — Ambulatory Visit (INDEPENDENT_AMBULATORY_CARE_PROVIDER_SITE_OTHER): Payer: PPO | Admitting: Family Medicine

## 2021-01-07 ENCOUNTER — Other Ambulatory Visit: Payer: Self-pay

## 2021-01-07 VITALS — BP 138/80 | HR 62 | Temp 98.8°F | Resp 18 | Ht 64.0 in | Wt 239.0 lb

## 2021-01-07 DIAGNOSIS — I10 Essential (primary) hypertension: Secondary | ICD-10-CM

## 2021-01-07 DIAGNOSIS — E038 Other specified hypothyroidism: Secondary | ICD-10-CM | POA: Diagnosis not present

## 2021-01-07 DIAGNOSIS — E785 Hyperlipidemia, unspecified: Secondary | ICD-10-CM | POA: Diagnosis not present

## 2021-01-07 NOTE — Assessment & Plan Note (Signed)
Deteriorated.  Pt mentioned weight loss medication options.  Encouraged her to look up Ozempic.  If labs allow, we could start this to improve weigh loss progress.

## 2021-01-07 NOTE — Progress Notes (Signed)
   Subjective:    Patient ID: Ashley Woods, female    DOB: [birth year], 75 y.o.   MRN: [id number]  HPI HTN- chronic problem, on Lisinopril 30mg  daily, Lasix 20mg  daily w/ adequate control.  + LE edema bilaterally but not taking Lasix regularly.  No CP, SOB, HAs, visual changes.  Hyperlipidemia- chronic problem, on Simvastatin 40mg  nightly.  Denies abd pain, N/V.  Hypothyroid- chronic problem, on Levothyroxine 124mcg daily.  + fatigue.  Obesity- pt has gained 5 lbs since April.  BMI is now 41.  No regular exercise, not following a particular diet.     Review of Systems For ROS see HPI   This visit occurred during the SARS-CoV-2 public health emergency.  Safety protocols were in place, including screening questions prior to the visit, additional usage of staff PPE, and extensive cleaning of exam room while observing appropriate contact time as indicated for disinfecting solutions.      Objective:   Physical Exam Vitals reviewed.  Constitutional:      General: She is not in acute distress.    Appearance: Normal appearance. She is well-developed. She is obese. She is not ill-appearing.  HENT:     Head: Normocephalic and atraumatic.  Eyes:     Conjunctiva/sclera: Conjunctivae normal.     Pupils: Pupils are equal, round, and reactive to light.  Neck:     Thyroid: No thyromegaly.  Cardiovascular:     Rate and Rhythm: Normal rate and regular rhythm.     Pulses: Normal pulses.     Heart sounds: Normal heart sounds. No murmur heard. Pulmonary:     Effort: Pulmonary effort is normal. No respiratory distress.     Breath sounds: Normal breath sounds.  Abdominal:     General: There is no distension.     Palpations: Abdomen is soft.     Tenderness: There is no abdominal tenderness.  Musculoskeletal:     Cervical back: Normal range of motion and neck supple.     Right lower leg: Edema (1+ pitting edema w/ venous stasis changes) present.     Left lower leg: Edema (1+ pitting edema w/  venous stasis changes) present.  Lymphadenopathy:     Cervical: No cervical adenopathy.  Skin:    General: Skin is warm and dry.  Neurological:     General: No focal deficit present.     Mental Status: She is alert and oriented to person, place, and time.  Psychiatric:        Mood and Affect: Mood normal.        Behavior: Behavior normal.          Assessment & Plan:

## 2021-01-07 NOTE — Assessment & Plan Note (Signed)
Chronic problem.  Adequate control today on Lisinopril 30mg  daily.  Encouraged her to take Lasix regularly due to her edema and venous stasis changes.  Will check labs due to ACE and diuretic.  No anticipated med changes.  Will follow.

## 2021-01-07 NOTE — Patient Instructions (Signed)
Schedule your complete physical in 6 months Schedule a lab appt

## 2021-01-07 NOTE — Assessment & Plan Note (Signed)
Chronic problem.  Pt feels her fatigue is age related but will check TSH and adjust levothyroxine as needed

## 2021-01-07 NOTE — Assessment & Plan Note (Signed)
Ongoing issue for pt.  On Simvastatin 40mg  nightly w/o difficulty.  Check labs.  Adjust meds prn

## 2021-02-05 ENCOUNTER — Other Ambulatory Visit: Payer: Self-pay | Admitting: Family Medicine

## 2021-02-07 ENCOUNTER — Telehealth: Payer: Self-pay

## 2021-02-07 ENCOUNTER — Other Ambulatory Visit: Payer: Self-pay

## 2021-02-07 ENCOUNTER — Other Ambulatory Visit: Payer: Self-pay | Admitting: Family Medicine

## 2021-02-07 ENCOUNTER — Telehealth: Payer: Self-pay | Admitting: *Deleted

## 2021-02-07 DIAGNOSIS — I10 Essential (primary) hypertension: Secondary | ICD-10-CM

## 2021-02-07 MED ORDER — LEVOTHYROXINE SODIUM 100 MCG PO TABS: ORAL_TABLET | ORAL | 0 refills | Status: DC

## 2021-02-07 MED ORDER — LISINOPRIL 30 MG PO TABS: ORAL_TABLET | ORAL | 0 refills | Status: DC

## 2021-02-07 NOTE — Telephone Encounter (Signed)
Medications sent to pharmacy

## 2021-02-07 NOTE — Chronic Care Management (AMB) (Signed)
Chronic Care Management   Outreach Note  02/07/2021 Name: Ashley Woods MRN: [id number] DOB: 06-03-1946  Ashley Woods is a 75 y.o. year old female who is a primary care patient of Beverely Low, Helane Rima, MD. I reached out to Burnard Leigh by phone today in response to a referral sent by Ms. Hansel Starling Remick's PCP Sheliah Hatch, MD     An unsuccessful telephone outreach was attempted today. The patient was referred to the case management team for assistance with care management and care coordination.   Follow Up Plan: A HIPAA compliant phone message was left for the patient providing contact information and requesting a return call.  If patient returns call to provider office, please advise to call Embedded Care Management Care Guide Gelila Well at [phone]  Burman Nieves, CCMA Care Guide, Embedded Care Coordination [facility]  Care Management  Direct Dial: [phone]

## 2021-02-07 NOTE — Telephone Encounter (Signed)
Pt needs refill on lisinopril (ZESTRIL) 30 MG tablet and levothyroxine (SYNTHROID) 100 MCG tablet [address] [id number] [place], [place] - [address] AT [address]   Pt call back [phone]

## 2021-02-14 NOTE — Chronic Care Management (AMB) (Signed)
Chronic Care Management   Outreach Note  02/14/2021 Name: EREN RICCIUTI MRN: [id number] DOB: [birth year]  NAVEA SECUNDINO is a 75 y.o. year old female who is a primary care patient of Beverely Low, Helane Rima, MD. I reached out to Burnard Leigh by phone today in response to a referral sent by Ms. Hansel Starling Lineback's PCP Sheliah Hatch, MD     A second unsuccessful telephone outreach was attempted today. The patient was referred to the case management team for assistance with care management and care coordination.   Follow Up Plan: A HIPAA compliant phone message was left for the patient providing contact information and requesting a return call.  If patient returns call to provider office, please advise to call Embedded Care Management Care Guide Quinnley Colasurdo at [phone]  Burman Nieves, CCMA Care Guide, Embedded Care Coordination [facility]  Care Management  Direct Dial: [phone]

## 2021-02-24 NOTE — Chronic Care Management (AMB) (Signed)
  Chronic Care Management   Outreach Note  02/24/2021 Name: Ashley Woods MRN: [id number] DOB: 1946/06/30  Ashley Woods is a 75 y.o. year old female who is a primary care patient of Birdie Riddle, Aundra Millet, MD. I reached out to [name] by phone today in response to a referral sent by Ms. [place] PCP Midge Minium, MD     Third unsuccessful telephone outreach was attempted today. The patient was referred to the case management team for assistance with care management and care coordination. The patient's primary care provider has been notified of our unsuccessful attempts to make or maintain contact with the patient. The care management team is pleased to engage with this patient at any time in the future should he/she be interested in assistance from the care management team.   Follow Up Plan: We have been unable to make contact with the patient for follow up.   Julian Hy, [place] Management  Direct Dial: [phone]

## 2021-03-05 ENCOUNTER — Other Ambulatory Visit: Payer: Self-pay | Admitting: Family Medicine

## 2021-03-05 DIAGNOSIS — E785 Hyperlipidemia, unspecified: Secondary | ICD-10-CM

## 2021-03-29 DIAGNOSIS — Z888 Allergy status to other drugs, medicaments and biological substances status: Secondary | ICD-10-CM | POA: Diagnosis not present

## 2021-03-29 DIAGNOSIS — Z886 Allergy status to analgesic agent status: Secondary | ICD-10-CM | POA: Diagnosis not present

## 2021-03-29 DIAGNOSIS — H6123 Impacted cerumen, bilateral: Secondary | ICD-10-CM | POA: Diagnosis not present

## 2021-03-29 DIAGNOSIS — Z881 Allergy status to other antibiotic agents status: Secondary | ICD-10-CM | POA: Diagnosis not present

## 2021-03-29 DIAGNOSIS — H60313 Diffuse otitis externa, bilateral: Secondary | ICD-10-CM | POA: Diagnosis not present

## 2021-03-29 DIAGNOSIS — Z885 Allergy status to narcotic agent status: Secondary | ICD-10-CM | POA: Diagnosis not present

## 2021-04-11 ENCOUNTER — Telehealth: Payer: Self-pay

## 2021-04-11 MED ORDER — CYANOCOBALAMIN 1000 MCG/ML IJ SOLN: INTRAMUSCULAR | 6 refills | Status: DC

## 2021-04-11 NOTE — Telephone Encounter (Signed)
Refill

## 2021-04-12 ENCOUNTER — Other Ambulatory Visit: Payer: Self-pay

## 2021-04-12 ENCOUNTER — Telehealth (INDEPENDENT_AMBULATORY_CARE_PROVIDER_SITE_OTHER): Payer: PPO | Admitting: Family Medicine

## 2021-04-12 ENCOUNTER — Encounter: Payer: Self-pay | Admitting: Family Medicine

## 2021-04-12 VITALS — Temp 98.0°F

## 2021-04-12 DIAGNOSIS — U071 COVID-19: Secondary | ICD-10-CM | POA: Diagnosis not present

## 2021-04-12 MED ORDER — BENZONATATE 100 MG PO CAPS
100.0000 mg | ORAL_CAPSULE | Freq: Two times a day (BID) | ORAL | 0 refills | Status: DC | PRN
Start: 2021-04-12 — End: 2022-09-06

## 2021-04-12 NOTE — Progress Notes (Signed)
Virtual Visit via Telephone Note  I connected with Ashley Woods on 04/12/21 at 10:20 AM EDT by telephone and verified that I am speaking with the correct person using two identifiers.   I discussed the limitations, risks, security and privacy concerns of performing an evaluation and management service by telephone and the availability of in person appointments. I also discussed with the patient that there may be a patient responsible charge related to this service. The patient expressed understanding and agreed to proceed.  Location patient: home, [place] Location provider: work or home office Participants present for the call: patient, provider, patient's husband Patient did not have a visit with me in the prior 7 days to address this/these issue(s).   History of Present Illness:  Acute telemedicine visit for Covid19: -Onset: 8 days ago; tested positive for Covid19  -Symptoms include: sinus congestion, sore throat, sinus headache, chills at times, cough, feels tired -is starting to feel better now -Denies: fever, CP, SOB, NVD, inability to eat/drink/get out bed -Pertinent past medical history: see below -Pertinent medication allergies:  Allergies  Allergen Reactions   Bactrim [Sulfamethoxazole-Trimethoprim] Nausea And Vomiting   Naproxen Nausea And Vomiting and Shortness Of Breath    Other reaction(s): Respiratory Distress (ALLERGY/intolerance) Other reaction(s): Respiratory Distress (ALLERGY/intolerance)   Trimethoprim Other (See Comments)   Sulfamethoxazole-Trimethoprim Nausea And Vomiting   Tramadol     Other reaction(s): Vomiting (intolerance)   Tramadol Hcl     REACTION: nausea   Zanaflex [Tizanidine] Other (See Comments)    Hallucinations  -COVID-19 vaccine status: J and J x1, has had covid 3x   Past Medical History:  Diagnosis Date   Anxiety    on meds   Asthma    occassionally   Cataract    bilateral-sx    Colon polyp    adenomatous   Colon polyps    COPD  (chronic obstructive pulmonary disease) (HCC)    Depression    on meds   Diverticulosis    GERD (gastroesophageal reflux disease)    on meds   Hemorrhoids    Hyperlipidemia    on meds   Hypertension    on meds   IBS (irritable bowel syndrome)    Macular degeneration    Dr. Hoyle Sauer   Overactive bladder    Right arm fracture    2018   Rosacea    Sleep apnea    uses cpap   Stroke (HCC)    hx of numerous TIA per MRI   Thyroid disease    on meds    Observations/Objective: Patient sounds cheerful and well on the phone. I do not appreciate any SOB. Speech and thought processing are grossly intact. Patient reported vitals:  Assessment and Plan:  COVID-19   Discussed treatment options (infusions and oral options and risk of drug interactions), ideal treatment window, potential complications, isolation and precautions for COVID-19. Discussed we are seeing symptoms typically last 5 dasy - 2 weeks with the current variant. She is starting to improve per her report. Opted for nasal saline, INS, staying hydrated and the patient did want a prescription for cough, Tessalon Rx sent.  Other symptomatic care measures summarized in patient instructions.  Advised to seek prompt in person care if worsening, new symptoms arise, or if is not improving with treatment. Advised of options for inperson care in case PCP office not available. Did let the patient know that I only do telemedicine shifts for [facility] on Tuesdays and Thursdays and advised a follow  up visit with PCP or at an [facility] if has further questions or concerns.   Follow Up Instructions:  I did not refer this patient for an OV with me in the next 24 hours for this/these issue(s).  I discussed the assessment and treatment plan with the patient. The patient was provided an opportunity to ask questions and all were answered. The patient agreed with the plan and demonstrated an understanding of the instructions.   I spent 18 minutes on  the date of this visit in the care of this patient. See summary of tasks completed to properly care for this patient in the detailed notes above which also included counseling of above, review of PMH, medications, allergies, evaluation of the patient and ordering and/or  instructing patient on testing and care options.     Ashley Kern, DO

## 2021-04-12 NOTE — Patient Instructions (Signed)
  HOME CARE TIPS:  -I sent the medication(s) we discussed to your pharmacy: Meds ordered this encounter  Medications   benzonatate (TESSALON) 100 MG capsule    Sig: Take 1 capsule (100 mg total) by mouth 2 (two) times daily as needed for cough.    Dispense:  20 capsule    Refill:  0     -can use tylenol  if needed for fevers, aches and pains per instructions  -can use nasal saline a few times per day if you have nasal congestion  -stay hydrated, drink plenty of fluids and eat small healthy meals - avoid dairy  -can take 1000 IU (64mg) Vit D3 and 100-500 mg of Vit C daily per instructions  -If the Covid test is positive, check out the [facility] for more information on home care, transmission and treatment for COVID19  -follow up with your doctor in 2-3 days unless improving and feeling better  -stay home while sick, except to seek medical care. If you have COVID19, ideally it would be best to stay home for a full 10 days since the onset of symptoms PLUS one day of no fever and feeling better. Wear a good mask that fits snugly (such as N95 or KN95) if around others to reduce the risk of transmission.  It was nice to meet you today, and I really hope you are feeling better soon. I help [facility] out with telemedicine visits on Tuesdays and Thursdays and am available for visits on those days. If you have any concerns or questions following this visit please schedule a follow up visit with your Primary Care doctor or seek care at a local urgent care clinic to avoid delays in care.    Seek in person care or schedule a follow up video visit promptly if your symptoms worsen, new concerns arise or you are not improving with treatment. Call 911 and/or seek emergency care if your symptoms are severe or life threatening.

## 2021-04-14 DIAGNOSIS — Z8669 Personal history of other diseases of the nervous system and sense organs: Secondary | ICD-10-CM | POA: Diagnosis not present

## 2021-04-14 DIAGNOSIS — R0981 Nasal congestion: Secondary | ICD-10-CM | POA: Diagnosis not present

## 2021-04-14 DIAGNOSIS — Z8616 Personal history of COVID-19: Secondary | ICD-10-CM | POA: Diagnosis not present

## 2021-04-20 ENCOUNTER — Other Ambulatory Visit: Payer: Self-pay

## 2021-04-20 ENCOUNTER — Other Ambulatory Visit: Payer: Self-pay | Admitting: Family Medicine

## 2021-04-20 DIAGNOSIS — E785 Hyperlipidemia, unspecified: Secondary | ICD-10-CM

## 2021-04-20 DIAGNOSIS — I10 Essential (primary) hypertension: Secondary | ICD-10-CM

## 2021-04-20 MED ORDER — LISINOPRIL 30 MG PO TABS: ORAL_TABLET | ORAL | 0 refills | Status: DC

## 2021-04-20 NOTE — Telephone Encounter (Signed)
Requesting:Klonopin 0.5mg  Contract: UDS: Last Visit:01/07/21 Next Visit:n/a Last Refill:11/11/20 30 tabs 0 refills  Please Advise

## 2021-04-29 DIAGNOSIS — G4733 Obstructive sleep apnea (adult) (pediatric): Secondary | ICD-10-CM | POA: Diagnosis not present

## 2021-05-03 ENCOUNTER — Ambulatory Visit (INDEPENDENT_AMBULATORY_CARE_PROVIDER_SITE_OTHER): Payer: PPO | Admitting: Registered Nurse

## 2021-05-03 ENCOUNTER — Ambulatory Visit: Payer: PPO | Admitting: Registered Nurse

## 2021-05-03 ENCOUNTER — Encounter: Payer: Self-pay | Admitting: Registered Nurse

## 2021-05-03 ENCOUNTER — Other Ambulatory Visit: Payer: Self-pay

## 2021-05-03 VITALS — BP 150/60 | HR 62 | Temp 98.0°F | Resp 18 | Ht 64.0 in | Wt 234.4 lb

## 2021-05-03 DIAGNOSIS — E785 Hyperlipidemia, unspecified: Secondary | ICD-10-CM | POA: Diagnosis not present

## 2021-05-03 DIAGNOSIS — I10 Essential (primary) hypertension: Secondary | ICD-10-CM | POA: Diagnosis not present

## 2021-05-03 DIAGNOSIS — R42 Dizziness and giddiness: Secondary | ICD-10-CM | POA: Diagnosis not present

## 2021-05-03 DIAGNOSIS — E038 Other specified hypothyroidism: Secondary | ICD-10-CM | POA: Diagnosis not present

## 2021-05-03 DIAGNOSIS — Z23 Encounter for immunization: Secondary | ICD-10-CM

## 2021-05-03 DIAGNOSIS — E538 Deficiency of other specified B group vitamins: Secondary | ICD-10-CM | POA: Diagnosis not present

## 2021-05-03 DIAGNOSIS — E559 Vitamin D deficiency, unspecified: Secondary | ICD-10-CM

## 2021-05-03 LAB — B12 AND FOLATE PANEL
Folate: 14.2 ng/mL (ref >5.9)
Vitamin B-12: 380 pg/mL (ref 211–911)

## 2021-05-03 LAB — COMPREHENSIVE METABOLIC PANEL
ALT: 16 U/L (ref 0–35)
AST: 17 U/L (ref 0–37)
Albumin: 4.2 g/dL (ref 3.5–5.2)
Alkaline Phosphatase: 58 U/L (ref 39–117)
BUN: 16 mg/dL (ref 6–23)
CO2: 28 mEq/L (ref 19–32)
Calcium: 9.3 mg/dL (ref 8.4–10.5)
Chloride: 105 mEq/L (ref 96–112)
Creatinine, Ser: 1.04 mg/dL (ref 0.40–1.20)
GFR: 52.75 mL/min — ABNORMAL LOW (ref >60.00)
Glucose, Bld: 90 mg/dL (ref 70–99)
Potassium: 4.2 mEq/L (ref 3.5–5.1)
Sodium: 143 mEq/L (ref 135–145)
Total Bilirubin: 0.7 mg/dL (ref 0.2–1.2)
Total Protein: 6 g/dL (ref 6.0–8.3)

## 2021-05-03 LAB — CBC WITH DIFFERENTIAL/PLATELET
Basophils Absolute: 0 10*3/uL (ref 0.0–0.1)
Basophils Relative: 0.9 % (ref 0.0–3.0)
Eosinophils Absolute: 0.1 10*3/uL (ref 0.0–0.7)
Eosinophils Relative: 2.1 % (ref 0.0–5.0)
HCT: 42.1 % (ref 36.0–46.0)
Hemoglobin: 14 g/dL (ref 12.0–15.0)
Lymphocytes Relative: 23.9 % (ref 12.0–46.0)
Lymphs Abs: 1.2 10*3/uL (ref 0.7–4.0)
MCHC: 33.3 g/dL (ref 30.0–36.0)
MCV: 94.8 fl (ref 78.0–100.0)
Monocytes Absolute: 0.3 10*3/uL (ref 0.1–1.0)
Monocytes Relative: 6.8 % (ref 3.0–12.0)
Neutro Abs: 3.3 10*3/uL (ref 1.4–7.7)
Neutrophils Relative %: 66.3 % (ref 43.0–77.0)
Platelets: 149 10*3/uL — ABNORMAL LOW (ref 150.0–400.0)
RBC: 4.43 Mil/uL (ref 3.87–5.11)
RDW: 13 % (ref 11.5–15.5)
WBC: 5 10*3/uL (ref 4.0–10.5)

## 2021-05-03 LAB — LIPID PANEL
Cholesterol: 146 mg/dL (ref 0–200)
HDL: 47.9 mg/dL (ref >39.00)
NonHDL: 98.36
Total CHOL/HDL Ratio: 3
Triglycerides: 215 mg/dL — ABNORMAL HIGH (ref 0.0–149.0)
VLDL: 43 mg/dL — ABNORMAL HIGH (ref 0.0–40.0)

## 2021-05-03 LAB — TSH: TSH: 1.53 u[IU]/mL (ref 0.35–5.50)

## 2021-05-03 LAB — HEMOGLOBIN A1C: Hgb A1c MFr Bld: 6 % (ref 4.6–6.5)

## 2021-05-03 LAB — VITAMIN D 25 HYDROXY (VIT D DEFICIENCY, FRACTURES): VITD: 43.71 ng/mL (ref 30.00–100.00)

## 2021-05-03 LAB — LDL CHOLESTEROL, DIRECT: Direct LDL: 62 mg/dL

## 2021-05-03 NOTE — Progress Notes (Signed)
Established Patient Office Visit  Subjective:  Patient ID: Ashley Woods, female    DOB: 1946/05/25  Age: 75 y.o. MRN: [id number]  CC:  Chief Complaint  Patient presents with   Fatigue    Patient states she has been very fatigue, feet swelling and tingling, and light headed/dizziness at time for a while.    HPI Ashley Woods presents for fatigue, foot pain  Fatigue Pressure on head, doe Recent hx of COVID Getting very shaky  Many night sweats  Does note episode this summer of near syncope when doing yard work.  Had to crawl back towards the home.   Hx of low vit d, and low vit b. Takes supplements.   Foot pain R>L, pain at night Some redness in calves, no swelling Chronic concern.  Past Medical History:  Diagnosis Date   Anxiety    on meds   Asthma    occassionally   Cataract    bilateral-sx    Colon polyp    adenomatous   Colon polyps    COPD (chronic obstructive pulmonary disease) ([place])    Depression    on meds   Diverticulosis    GERD (gastroesophageal reflux disease)    on meds   Hemorrhoids    Hyperlipidemia    on meds   Hypertension    on meds   IBS (irritable bowel syndrome)    Macular degeneration    Dr. Hoyle Sauer   Overactive bladder    Right arm fracture    2018   Rosacea    Sleep apnea    uses cpap   Stroke ([place])    hx of numerous TIA per MRI   Thyroid disease    on meds    Past Surgical History:  Procedure Laterality Date   BLADDER SURGERY  2015   CATARACT EXTRACTION, BILATERAL  2016   CERVICAL FUSION  2000   COLONOSCOPY  2013   TA   HERNIA REPAIR  4098   umbilical   HYSTERECTOMY ABDOMINAL WITH SALPINGO-OOPHORECTOMY  1995   OOPHORECTOMY     PARTIAL HYSTERECTOMY     POLYPECTOMY  2013   TA   SHOULDER SURGERY Right 09/2019   TONSILLECTOMY  1960    Family History  Problem Relation Age of Onset   Heart disease Daughter    Delorse Limber White syndrome Daughter    Colon cancer Neg Hx    Inflammatory bowel  disease Neg Hx    Esophageal cancer Neg Hx    Stomach cancer Neg Hx    Rectal cancer Neg Hx    Colon polyps Neg Hx     Social History   Socioeconomic History   Marital status: Married    Spouse name: Not on file   Number of children: 2   Years of education: Not on file   Highest education level: Not on file  Occupational History   Occupation: currently on disability  Tobacco Use   Smoking status: Former    Packs/day: 1.00    Years: 45.00    Pack years: 45.00    Types: E-cigarettes, Cigarettes    Quit date: 04/30/2013    Years since quitting: 8.0   Smokeless tobacco: Never   Tobacco comments:    former 1ppd x45 years, 09-13-17 report vaping  Vaping Use   Vaping Use: Some days  Substance and Sexual Activity   Alcohol use: No   Drug use: No   Sexual activity: Not on file  Other  Topics Concern   Not on file  Social History Narrative   Husband Jenny Reichmann on disability.    Former Secretary/administrator   1 year of college   Social Determinants of Radio broadcast assistant Strain: Low Risk    Difficulty of Paying Living Expenses: Not hard at all  Food Insecurity: No Food Insecurity   Worried About Charity fundraiser in the Last Year: Never true   Arboriculturist in the Last Year: Never true  Transportation Needs: No Transportation Needs   Lack of Transportation (Medical): No   Lack of Transportation (Non-Medical): No  Physical Activity: Insufficiently Active   Days of Exercise per Week: 3 days   Minutes of Exercise per Session: 40 min  Stress: No Stress Concern Present   Feeling of Stress : Not at all  Social Connections: Moderately Isolated   Frequency of Communication with Friends and Family: More than three times a week   Frequency of Social Gatherings with Friends and Family: More than three times a week   Attends Religious Services: Never   Marine scientist or Organizations: No   Attends Music therapist: Never   Marital Status: Married  Arboriculturist Violence: Not At Risk   Fear of Current or Ex-Partner: No   Emotionally Abused: No   Physically Abused: No   Sexually Abused: No    Outpatient Medications Prior to Visit  Medication Sig Dispense Refill   albuterol (PROVENTIL HFA;VENTOLIN HFA) 108 (90 Base) MCG/ACT inhaler Inhale into the lungs.     benzonatate (TESSALON) 100 MG capsule Take 1 capsule (100 mg total) by mouth 2 (two) times daily as needed for cough. 20 capsule 0   calcium-vitamin D (OSCAL WITH D) 500-200 MG-UNIT tablet Take by mouth.     citalopram (CELEXA) 40 MG tablet TAKE 1 TABLET(40 MG) BY MOUTH DAILY 90 tablet 0   clonazePAM (KLONOPIN) 0.5 MG tablet TAKE 1 TABLET(0.5 MG) BY MOUTH AT BEDTIME AS NEEDED 30 tablet 1   cyanocobalamin (,VITAMIN B-12,) 1000 MCG/ML injection INJECT 1 ML INTO THE MUSCLE EVERY 30 DAYS 1 mL 6   furosemide (LASIX) 20 MG tablet Take 2 tablets (40 mg total) by mouth daily. 180 tablet 1   HYDROcodone-acetaminophen (NORCO/VICODIN) 5-325 MG tablet Take 1 tablet by mouth every 6 (six) hours as needed. 60 tablet 0   levothyroxine (SYNTHROID) 100 MCG tablet TAKE 1 TABLET(100 MCG) BY MOUTH DAILY 90 tablet 0   lisinopril (ZESTRIL) 30 MG tablet TAKE 1 TABLET(30 MG) BY MOUTH DAILY 90 tablet 0   loperamide (IMODIUM A-D) 2 MG tablet Take 2 mg by mouth.      omeprazole (PRILOSEC) 40 MG capsule TAKE 1 CAPSULE(40 MG) BY MOUTH DAILY 90 capsule 0   oxybutynin (DITROPAN) 5 MG tablet TAKE 1 TABLET(5 MG) BY MOUTH TWICE DAILY 180 tablet 0   RESTASIS 0.05 % ophthalmic emulsion Place 1 drop into both eyes 2 (two) times daily.      simvastatin (ZOCOR) 40 MG tablet TAKE 1 TABLET(40 MG) BY MOUTH AT BEDTIME 90 tablet 0   SYRINGE-NEEDLE, DISP, 3 ML (BD SAFETYGLIDE SYRINGE/NEEDLE) 25G X 1" 3 ML MISC Please use one syringe and needle for each injection. 10 each 0   No facility-administered medications prior to visit.    Allergies  Allergen Reactions   Bactrim [Sulfamethoxazole-Trimethoprim] Nausea And Vomiting    Naproxen Nausea And Vomiting and Shortness Of Breath    Other reaction(s): Respiratory Distress (ALLERGY/intolerance) Other reaction(s): Respiratory  Distress (ALLERGY/intolerance)   Trimethoprim Other (See Comments)   Sulfamethoxazole-Trimethoprim Nausea And Vomiting   Tramadol     Other reaction(s): Vomiting (intolerance)   Tramadol Hcl     REACTION: nausea   Zanaflex [Tizanidine] Other (See Comments)    Hallucinations    ROS Review of Systems Per hpi   Objective:    Physical Exam Vitals and nursing note reviewed.  Constitutional:      General: She is not in acute distress.    Appearance: Normal appearance. She is normal weight. She is not ill-appearing, toxic-appearing or diaphoretic.  Cardiovascular:     Rate and Rhythm: Normal rate and regular rhythm.     Heart sounds: Normal heart sounds. No murmur heard.   No friction rub. No gallop.  Pulmonary:     Effort: Pulmonary effort is normal. No respiratory distress.     Breath sounds: Normal breath sounds. No stridor. No wheezing, rhonchi or rales.  Chest:     Chest wall: No tenderness.  Musculoskeletal:        General: No swelling, tenderness, deformity or signs of injury. Normal range of motion.     Right lower leg: Edema (mild) present.     Left lower leg: Edema (mild) present.  Skin:    General: Skin is warm and dry.  Neurological:     General: No focal deficit present.     Mental Status: She is alert and oriented to person, place, and time. Mental status is at baseline.  Psychiatric:        Mood and Affect: Mood normal.        Behavior: Behavior normal.        Thought Content: Thought content normal.        Judgment: Judgment normal.    BP (!) 150/60   Pulse 62   Temp 98 F (36.7 C) (Temporal)   Resp 18   Ht 5\' 4"  (1.626 m)   Wt 234 lb 6.4 oz (106.3 kg)   SpO2 99%   BMI 40.23 kg/m  Wt Readings from Last 3 Encounters:  05/03/21 234 lb 6.4 oz (106.3 kg)  01/07/21 239 lb (108.4 kg)  11/01/20 234 lb  (106.1 kg)     Health Maintenance Due  Topic Date Due   INFLUENZA VACCINE  02/28/2021    There are no preventive care reminders to display for this patient.  Lab Results  Component Value Date   TSH 2.68 05/25/2020   Lab Results  Component Value Date   WBC 4.6 05/25/2020   HGB 14.7 05/25/2020   HCT 43.6 05/25/2020   MCV 94.0 05/25/2020   PLT 151.0 05/25/2020   Lab Results  Component Value Date   NA 141 05/25/2020   K 4.5 05/25/2020   CO2 31 05/25/2020   GLUCOSE 96 05/25/2020   BUN 14 05/25/2020   CREATININE 0.85 05/25/2020   BILITOT 0.7 05/25/2020   ALKPHOS 70 05/25/2020   AST 15 05/25/2020   ALT 15 05/25/2020   PROT 6.0 05/25/2020   ALBUMIN 4.4 05/25/2020   CALCIUM 9.3 05/25/2020   GFR 67.64 05/25/2020   Lab Results  Component Value Date   CHOL 165 05/25/2020   Lab Results  Component Value Date   HDL 52.90 05/25/2020   Lab Results  Component Value Date   LDLCALC 73 05/25/2020   Lab Results  Component Value Date   TRIG 196.0 (H) 05/25/2020   Lab Results  Component Value Date   CHOLHDL 3 05/25/2020  Lab Results  Component Value Date   HGBA1C 5.9 02/15/2017      Assessment & Plan:   Problem List Items Addressed This Visit   None Visit Diagnoses     Dizziness    -  Primary   Relevant Orders   EKG 12-Lead (Completed)       No orders of the defined types were placed in this encounter.   Follow-up: No follow-ups on file.   PLAN Ekg today compared to 04/21/2009. No acute changes. RRR. Notable for rate at 62 - low end of normal, low for expected for patient. Will refer to cardiology as bradycardia may explain her symptoms Labs collected. Will follow up with the patient as warranted. Patient encouraged to call clinic with any questions, comments, or concerns.  Maximiano Coss, NP

## 2021-05-03 NOTE — Addendum Note (Signed)
Addended by: Patrcia Dolly on: 05/03/2021 04:53 PM   Modules accepted: Orders

## 2021-05-03 NOTE — Patient Instructions (Addendum)
Ashley Woods -   Labs will be back today or tomorrow. I'll call with any urgent concerns.  Cardiology will reach out to schedule.   Let me know if symptoms worsen or fail to improve  Thank you  Rich     If you have lab work done today you will be contacted with your lab results within the next 2 weeks.  If you have not heard from [place] then please contact us. The fastest way to get your results is to register for My Chart.   IF you received an x-ray today, you will receive an invoice from [facility]. Please contact [facility] at [phone] with questions or concerns regarding your invoice.   IF you received labwork today, you will receive an invoice from [facility]. Please contact [facility] at [phone] with questions or concerns regarding your invoice.   Our billing staff will not be able to assist you with questions regarding bills from these companies.  You will be contacted with the lab results as soon as they are available. The fastest way to get your results is to activate your My Chart account. Instructions are located on the last page of this paperwork. If you have not heard from [place] regarding the results in 2 weeks, please contact this office.

## 2021-05-06 ENCOUNTER — Other Ambulatory Visit: Payer: Self-pay | Admitting: Family Medicine

## 2021-05-27 ENCOUNTER — Telehealth: Payer: Self-pay | Admitting: *Deleted

## 2021-05-27 NOTE — Chronic Care Management (AMB) (Signed)
  Chronic Care Management   Outreach Note  05/27/2021 Name: Ashley Woods MRN: [id number] DOB: June 21, 1946  Ashley Woods is a 75 y.o. year old female who is a primary care patient of Ashley Woods, Ashley Millet, MD. I reached out to Ashley Woods in response to a referral sent by Ashley Woods's primary care provider.  An unsuccessful telephone outreach was attempted Woods. The patient was referred to the case management team for assistance with care management and care coordination.   Follow Up Plan: A HIPAA compliant phone message was left for the patient providing contact information and requesting a return call.  The care management team will reach out to the patient again over the next 7 days.  If patient returns call to provider office, please advise to call Ashley Woods* at [phone].*  [place] Management  Direct Dial: [phone]

## 2021-05-27 NOTE — Chronic Care Management (AMB) (Signed)
  Chronic Care Management   Note  05/27/2021 Name: Ashley Woods MRN: [id number] DOB: 1946/06/13  Ashley Woods is a 75 y.o. year old female who is a primary care patient of Birdie Riddle, Aundra Millet, MD. I reached out to [name] by phone today in response to a referral sent by Ashley Woods PCP.  Ashley Woods was given information about Chronic Care Management services today including:  CCM service includes personalized support from designated clinical staff supervised by her physician, including individualized plan of care and coordination with other care providers 24/7 contact phone numbers for assistance for urgent and routine care needs. Service will only be billed when office clinical staff spend 20 minutes or more in a month to coordinate care. Only one practitioner may furnish and bill the service in a calendar month. The patient may stop CCM services at any time (effective at the end of the month) by phone call to the office staff. The patient is responsible for co-pay (up to 20% after annual deductible is met) if co-pay is required by the individual health plan.   Patient did not agree to enrollment in care management services and does not wish to consider at this time.  Follow up plan: Patient declines engagement by the care management team. Appropriate care team members and provider have been notified via electronic communication. The care management team is available to follow up with the patient after provider conversation with the patient regarding recommendation for care management engagement and subsequent re-referral to the care management team.   [place] Management  Direct Dial: [phone]

## 2021-06-08 NOTE — Progress Notes (Signed)
Referring-Katherine Birdie Riddle, MD Reason for referral-dizziness and hypertension  HPI: 75 year old female for evaluation of dizziness and hypertension at request of Annye Asa, MD.  Carotid Dopplers April 2019 showed bilateral 50 to 69% stenosis.  Patient has had elevated blood pressure.  She describes intermittent fatigue.  She has dyspnea with more vigorous activities but not routine activities.  No orthopnea or PND but she has chronic mild pedal edema.  She denies chest pain.  No palpitations or syncope but she describes "being in a fog" at times for 24 hours.  No other neurological symptoms.  Cardiology now asked to evaluate.   Current Outpatient Medications  Medication Sig Dispense Refill   albuterol (PROVENTIL HFA;VENTOLIN HFA) 108 (90 Base) MCG/ACT inhaler Inhale into the lungs.     benzonatate (TESSALON) 100 MG capsule Take 1 capsule (100 mg total) by mouth 2 (two) times daily as needed for cough. 20 capsule 0   calcium-vitamin D (OSCAL WITH D) 500-200 MG-UNIT tablet Take by mouth.     citalopram (CELEXA) 40 MG tablet TAKE 1 TABLET(40 MG) BY MOUTH DAILY 90 tablet 0   clonazePAM (KLONOPIN) 0.5 MG tablet TAKE 1 TABLET(0.5 MG) BY MOUTH AT BEDTIME AS NEEDED 30 tablet 1   cyanocobalamin (,VITAMIN B-12,) 1000 MCG/ML injection INJECT 1 ML INTO THE MUSCLE EVERY 30 DAYS 1 mL 6   furosemide (LASIX) 20 MG tablet Take 2 tablets (40 mg total) by mouth daily. 180 tablet 1   HYDROcodone-acetaminophen (NORCO/VICODIN) 5-325 MG tablet Take 1 tablet by mouth every 6 (six) hours as needed. 60 tablet 0   levothyroxine (SYNTHROID) 100 MCG tablet TAKE 1 TABLET(100 MCG) BY MOUTH DAILY 90 tablet 0   lisinopril (ZESTRIL) 30 MG tablet TAKE 1 TABLET(30 MG) BY MOUTH DAILY 90 tablet 0   loperamide (IMODIUM A-D) 2 MG tablet Take 2 mg by mouth.      omeprazole (PRILOSEC) 40 MG capsule TAKE 1 CAPSULE(40 MG) BY MOUTH DAILY 90 capsule 0   oxybutynin (DITROPAN) 5 MG tablet TAKE 1 TABLET(5 MG) BY MOUTH TWICE DAILY  180 tablet 0   RESTASIS 0.05 % ophthalmic emulsion Place 1 drop into both eyes 2 (two) times daily.      simvastatin (ZOCOR) 40 MG tablet TAKE 1 TABLET(40 MG) BY MOUTH AT BEDTIME 90 tablet 0   SYRINGE-NEEDLE, DISP, 3 ML (BD SAFETYGLIDE SYRINGE/NEEDLE) 25G X 1" 3 ML MISC Please use one syringe and needle for each injection. 10 each 0   No current facility-administered medications for this visit.     Past Medical History:  Diagnosis Date   Anxiety    on meds   Asthma    occassionally   Cataract    bilateral-sx    Colon polyp    adenomatous   COPD (chronic obstructive pulmonary disease) ([place])    Depression    on meds   Diverticulosis    GERD (gastroesophageal reflux disease)    on meds   Hemorrhoids    Hyperlipidemia    on meds   Hypertension    on meds   IBS (irritable bowel syndrome)    Macular degeneration    Dr. Hoyle Sauer   Overactive bladder    Right arm fracture    2018   Rosacea    Sleep apnea    uses cpap   Stroke ([place])    hx of numerous TIA per MRI   Thyroid disease    on meds    Past Surgical History:  Procedure Laterality Date  BLADDER SURGERY  2015   CATARACT EXTRACTION, BILATERAL  2016   CERVICAL FUSION  2000   COLONOSCOPY  2013   TA   HERNIA REPAIR  5631   umbilical   HYSTERECTOMY ABDOMINAL WITH SALPINGO-OOPHORECTOMY  1995   OOPHORECTOMY     PARTIAL HYSTERECTOMY     POLYPECTOMY  2013   TA   SHOULDER SURGERY Right 09/2019   TONSILLECTOMY  1960    Social History   Socioeconomic History   Marital status: Married    Spouse name: Not on file   Number of children: 2   Years of education: Not on file   Highest education level: Not on file  Occupational History   Occupation: currently on disability  Tobacco Use   Smoking status: Former    Packs/day: 1.00    Years: 45.00    Pack years: 45.00    Types: E-cigarettes, Cigarettes    Quit date: 04/30/2013    Years since quitting: 8.1   Smokeless tobacco: Never   Tobacco comments:     former 1ppd x45 years, 09-13-17 report vaping  Vaping Use   Vaping Use: Some days  Substance and Sexual Activity   Alcohol use: No   Drug use: No   Sexual activity: Not on file  Other Topics Concern   Not on file  Social History Narrative   Husband Jenny Reichmann on disability.    Former Secretary/administrator   1 year of college   Social Determinants of Radio broadcast assistant Strain: Low Risk    Difficulty of Paying Living Expenses: Not hard at all  Food Insecurity: No Food Insecurity   Worried About Charity fundraiser in the Last Year: Never true   Arboriculturist in the Last Year: Never true  Transportation Needs: No Transportation Needs   Lack of Transportation (Medical): No   Lack of Transportation (Non-Medical): No  Physical Activity: Insufficiently Active   Days of Exercise per Week: 3 days   Minutes of Exercise per Session: 40 min  Stress: No Stress Concern Present   Feeling of Stress : Not at all  Social Connections: Moderately Isolated   Frequency of Communication with Friends and Family: More than three times a week   Frequency of Social Gatherings with Friends and Family: More than three times a week   Attends Religious Services: Never   Marine scientist or Organizations: No   Attends Music therapist: Never   Marital Status: Married  Human resources officer Violence: Not At Risk   Fear of Current or Ex-Partner: No   Emotionally Abused: No   Physically Abused: No   Sexually Abused: No    Family History  Problem Relation Age of Onset   Dementia Father    Heart disease Daughter    Yves Dill Parkinson White syndrome Daughter    Colon cancer Neg Hx    Inflammatory bowel disease Neg Hx    Esophageal cancer Neg Hx    Stomach cancer Neg Hx    Rectal cancer Neg Hx    Colon polyps Neg Hx     ROS: Tingling in feet bilaterally but no fevers or chills, productive cough, hemoptysis, dysphasia, odynophagia, melena, hematochezia, dysuria, hematuria, rash, seizure  activity, orthopnea, PND, claudication. Remaining systems are negative.  Physical Exam: Well-developed obese in no acute distress.  Skin is warm and dry.  HEENT is normal.  Neck is supple.  Chest is clear to auscultation with normal expansion.  Cardiovascular exam is  regular rate and rhythm.  2/6 systolic ejection murmur Abdominal exam nontender or distended. No masses palpated. Extremities show 1+ edema. neuro grossly intact  ECG-May 03, 2021-normal sinus rhythm with no ST changes.  Personally reviewed  A/P  1 dizziness-etiology unclear.  She states that she will "be in a fog" for 24 consecutive hours but denies syncope.  This does not sound to be a cardiac issue.  She does have dyspnea on exertion and I will arrange an echocardiogram to assess LV function.  2 hypertension-blood pressure elevated.  Increase lisinopril to 40 mg daily.  Check potassium and renal function in 1 week.  Follow blood pressure and advance medications as needed.  3 hyperlipidemia-continue statin.  4 history of carotid artery disease-schedule follow-up carotid Dopplers.  Continue statin.  Add aspirin 81 mg daily.  5 sleep apnea-continue CPAP.  Kirk Ruths, MD

## 2021-06-15 ENCOUNTER — Other Ambulatory Visit: Payer: Self-pay

## 2021-06-15 ENCOUNTER — Ambulatory Visit (INDEPENDENT_AMBULATORY_CARE_PROVIDER_SITE_OTHER): Payer: PPO | Admitting: Cardiology

## 2021-06-15 ENCOUNTER — Encounter: Payer: Self-pay | Admitting: Cardiology

## 2021-06-15 VITALS — BP 158/81 | HR 93 | Ht 65.0 in | Wt 236.4 lb

## 2021-06-15 DIAGNOSIS — R0602 Shortness of breath: Secondary | ICD-10-CM

## 2021-06-15 DIAGNOSIS — I10 Essential (primary) hypertension: Secondary | ICD-10-CM | POA: Diagnosis not present

## 2021-06-15 DIAGNOSIS — I6523 Occlusion and stenosis of bilateral carotid arteries: Secondary | ICD-10-CM | POA: Diagnosis not present

## 2021-06-15 MED ORDER — LISINOPRIL 40 MG PO TABS
40.0000 mg | ORAL_TABLET | Freq: Every day | ORAL | 3 refills | Status: DC
Start: 2021-06-15 — End: 2021-10-21

## 2021-06-15 NOTE — Patient Instructions (Signed)
Medication Instructions:   INCREASE LISINOPRIL TO 40 MG ONCE DAILY  *If you need a refill on your cardiac medications before your next appointment, please call your pharmacy*   Lab Work:  Your physician recommends that you return for lab work in: [place]  If you have labs (blood work) drawn today and your tests are completely normal, you will receive your results only by: [employer] (if you have MyChart) OR A paper copy in the mail If you have any lab test that is abnormal or we need to change your treatment, we will call you to review the results.   Testing/Procedures:  Your physician has requested that you have an echocardiogram. Echocardiography is a painless test that uses sound waves to create images of your heart. It provides your doctor with information about the size and shape of your heart and how well your heart's chambers and valves are working. This procedure takes approximately one hour. There are no restrictions for this procedure. [place]  Your physician has requested that you have a carotid duplex. This test is an ultrasound of the carotid arteries in your neck. It looks at blood flow through these arteries that supply the brain with blood. Allow one hour for this exam. There are no restrictions or special instructions. [place] [facility]   Follow-Up: At [facility], you and your health needs are our priority.  As part of our continuing mission to provide you with exceptional heart care, we have created designated Provider Care Teams.  These Care Teams include your primary Cardiologist (physician) and Advanced Practice Providers (APPs -  Physician Assistants and Nurse Practitioners) who all work together to provide you with the care you need, when you need it.  We recommend signing up for the patient portal called "MyChart".  Sign up information is provided on this After Visit Summary.  MyChart is used to connect with patients for Virtual  Visits (Telemedicine).  Patients are able to view lab/test results, encounter notes, upcoming appointments, etc.  Non-urgent messages can be sent to your provider as well.   To learn more about what you can do with MyChart, go to [email or web address].    Your next appointment:   6 month(s)  The format for your next appointment:   In Person  Provider:   Kirk Ruths MD

## 2021-07-01 DIAGNOSIS — I10 Essential (primary) hypertension: Secondary | ICD-10-CM | POA: Diagnosis not present

## 2021-07-02 LAB — BASIC METABOLIC PANEL
BUN: 17 mg/dL (ref 7–25)
CO2: 27 mmol/L (ref 20–32)
Calcium: 9.3 mg/dL (ref 8.6–10.4)
Chloride: 106 mmol/L (ref 98–110)
Creat: 1 mg/dL (ref 0.60–1.00)
Glucose, Bld: 96 mg/dL (ref 65–99)
Potassium: 4 mmol/L (ref 3.5–5.3)
Sodium: 142 mmol/L (ref 135–146)

## 2021-07-04 ENCOUNTER — Encounter: Payer: Self-pay | Admitting: *Deleted

## 2021-07-21 ENCOUNTER — Other Ambulatory Visit: Payer: Self-pay | Admitting: Family Medicine

## 2021-07-25 ENCOUNTER — Other Ambulatory Visit: Payer: Self-pay | Admitting: Family Medicine

## 2021-07-25 DIAGNOSIS — E785 Hyperlipidemia, unspecified: Secondary | ICD-10-CM

## 2021-07-27 ENCOUNTER — Ambulatory Visit (HOSPITAL_BASED_OUTPATIENT_CLINIC_OR_DEPARTMENT_OTHER): Payer: PPO

## 2021-08-02 ENCOUNTER — Other Ambulatory Visit: Payer: Self-pay

## 2021-08-02 DIAGNOSIS — G8929 Other chronic pain: Secondary | ICD-10-CM

## 2021-08-02 MED ORDER — HYDROCODONE-ACETAMINOPHEN 5-325 MG PO TABS: 1.0000 | ORAL_TABLET | Freq: Four times a day (QID) | ORAL | 0 refills | Status: DC | PRN

## 2021-08-02 NOTE — Telephone Encounter (Signed)
Patient is requesting a refill of the following medications: Requested Prescriptions   Pending Prescriptions Disp Refills   HYDROcodone-acetaminophen (NORCO/VICODIN) 5-325 MG tablet 60 tablet 0    Sig: Take 1 tablet by mouth every 6 (six) hours as needed.    Date of patient request: 08/02/21 Last office visit: 05/03/2021 Orland Mustard Date of last refill: 12/29/20 Last refill amount: 60 tab 0R

## 2021-08-02 NOTE — Telephone Encounter (Signed)
Caller name:Aina Staria [place] callback [phone]  Encourage patient to contact the pharmacy for refills or they can request refills through [facility]  (Please schedule appointment if patient has not been seen in over a year)  MEDICATION NAME & DOSE:HYDROcodone-acetaminophen (NORCO/VICODIN) 5-325 MG tablet [[id number]] , clonazePAM (KLONOPIN) 0.5 MG tablet   Notes/Comments from patient:  [place] TO: [id number] [place], [place] - [address] AT [address]   Please notify patient: It takes 48-72 hours to process rx refill requests Ask patient to call pharmacy to ensure rx is ready before heading there.   (CLINICAL TO FILL OR ROUTE PER PROTOCOLS)

## 2021-08-12 ENCOUNTER — Ambulatory Visit (INDEPENDENT_AMBULATORY_CARE_PROVIDER_SITE_OTHER): Payer: PPO

## 2021-08-12 ENCOUNTER — Ambulatory Visit: Payer: PPO | Admitting: Podiatry

## 2021-08-12 ENCOUNTER — Encounter: Payer: Self-pay | Admitting: Podiatry

## 2021-08-12 ENCOUNTER — Other Ambulatory Visit: Payer: Self-pay

## 2021-08-12 DIAGNOSIS — M84374A Stress fracture, right foot, initial encounter for fracture: Secondary | ICD-10-CM | POA: Diagnosis not present

## 2021-08-12 DIAGNOSIS — M79671 Pain in right foot: Secondary | ICD-10-CM

## 2021-08-12 MED ORDER — METHYLPREDNISOLONE 4 MG PO TBPK: ORAL_TABLET | ORAL | 0 refills | Status: DC

## 2021-08-12 NOTE — Progress Notes (Signed)
°  Subjective:  Patient ID: Ashley Woods, female    DOB: 05-Aug-1945,   MRN: [id number]  No chief complaint on file.   76 y.o. female presents for right foot pain that has been present for 2 months. Relates it causes swelling and will come and go. Pain is mostly at the top of the foot. She does also relates a lump on the leg that she is concerned about. When in pain everything she tries nothing relieves the pain . Denies any other pedal complaints. Denies n/v/f/c.   Past Medical History:  Diagnosis Date   Anxiety    on meds   Asthma    occassionally   Cataract    bilateral-sx    Colon polyp    adenomatous   COPD (chronic obstructive pulmonary disease) ([place])    Depression    on meds   Diverticulosis    GERD (gastroesophageal reflux disease)    on meds   Hemorrhoids    Hyperlipidemia    on meds   Hypertension    on meds   IBS (irritable bowel syndrome)    Macular degeneration    Dr. Hoyle Sauer   Overactive bladder    Right arm fracture    2018   Rosacea    Sleep apnea    uses cpap   Stroke ([place])    hx of numerous TIA per MRI   Thyroid disease    on meds    Objective:  Physical Exam: Vascular: DP/PT pulses 2/4 bilateral. CFT <3 seconds. Normal hair growth on digits. No edema.  Skin. No lacerations or abrasions bilateral feet.  Musculoskeletal: MMT 5/5 bilateral lower extremities in DF, PF, Inversion and Eversion. Deceased ROM in DF of ankle joint. Tender to dorsal third and fourth metatarsals and along extensor tendons. Pain with plantarflexion of the digits.  Neurological: Sensation intact to light touch.   Assessment:   1. Right foot pain      Plan:  Patient was evaluated and treated and all questions answered. -Xrays reviewed. Periosteal reaction noted around fourth and mildly around third metatarsals.Concern for possible stress reaction.  -Discussed treatement options for stress reaction vs extensor tendonitis; risks, alternatives, and benefits  explained.  -Dispensed CAM boot . Patient to wear at all times and instructed on use -Recommend protection, rest, ice, elevation daily until symptoms improve -Rx pain med/antinflammatories as needed -Medrol dose pack provided.  -Patient to return to office in 4 weeks for serial x-rays to assess healing  or sooner if condition worsens.   Lorenda Peck, DPM

## 2021-08-31 ENCOUNTER — Other Ambulatory Visit: Payer: Self-pay

## 2021-08-31 ENCOUNTER — Ambulatory Visit (HOSPITAL_BASED_OUTPATIENT_CLINIC_OR_DEPARTMENT_OTHER)
Admission: RE | Admit: 2021-08-31 | Discharge: 2021-08-31 | Disposition: A | Discharge status: Home / Self Care | Payer: PPO | Source: Ambulatory Visit | Attending: Cardiology | Admitting: Cardiology

## 2021-08-31 DIAGNOSIS — R0602 Shortness of breath: Secondary | ICD-10-CM | POA: Diagnosis not present

## 2021-08-31 LAB — ECHOCARDIOGRAM COMPLETE
AR max vel: 2.3 cm2
AV Area VTI: 2.2 cm2
AV Area mean vel: 1.97 cm2
AV Mean grad: 8 mmHg
AV Peak grad: 12.8 mmHg
Ao pk vel: 1.79 m/s
Area-P 1/2: 3.97 cm2
S' Lateral: 3 cm

## 2021-08-31 NOTE — Progress Notes (Signed)
°  Echocardiogram 2D Echocardiogram has been performed.  Merrie Roof F 08/31/2021, 12:29 PM

## 2021-09-02 ENCOUNTER — Ambulatory Visit (HOSPITAL_BASED_OUTPATIENT_CLINIC_OR_DEPARTMENT_OTHER)
Admission: RE | Admit: 2021-09-02 | Discharge: 2021-09-02 | Disposition: A | Discharge status: Home / Self Care | Payer: PPO | Source: Ambulatory Visit | Attending: Cardiology | Admitting: Cardiology

## 2021-09-02 ENCOUNTER — Other Ambulatory Visit: Payer: Self-pay

## 2021-09-02 DIAGNOSIS — I6523 Occlusion and stenosis of bilateral carotid arteries: Secondary | ICD-10-CM

## 2021-09-05 ENCOUNTER — Other Ambulatory Visit: Payer: Self-pay

## 2021-09-05 DIAGNOSIS — I6523 Occlusion and stenosis of bilateral carotid arteries: Secondary | ICD-10-CM

## 2021-09-09 ENCOUNTER — Encounter: Payer: Self-pay | Admitting: Podiatry

## 2021-09-09 ENCOUNTER — Other Ambulatory Visit: Payer: Self-pay | Admitting: Family Medicine

## 2021-09-09 ENCOUNTER — Other Ambulatory Visit: Payer: Self-pay

## 2021-09-09 ENCOUNTER — Ambulatory Visit: Payer: PPO | Admitting: Podiatry

## 2021-09-09 ENCOUNTER — Ambulatory Visit (INDEPENDENT_AMBULATORY_CARE_PROVIDER_SITE_OTHER): Payer: PPO

## 2021-09-09 DIAGNOSIS — M84374D Stress fracture, right foot, subsequent encounter for fracture with routine healing: Secondary | ICD-10-CM

## 2021-09-09 DIAGNOSIS — M79671 Pain in right foot: Secondary | ICD-10-CM | POA: Diagnosis not present

## 2021-09-09 DIAGNOSIS — Z1231 Encounter for screening mammogram for malignant neoplasm of breast: Secondary | ICD-10-CM

## 2021-09-09 NOTE — Progress Notes (Signed)
°  Subjective:  Patient ID: Ashley Woods, female    DOB: July 05, 1946,   MRN: [id number]  Chief Complaint  Patient presents with   Foot Pain     4 week follow up for right foot pain.    75 y.o. female presents for follow-up of right foot pain. Relates it is doing better. Has not always been wearing the CAM boot. Does still have some tenderness and swelling. Has been using compression stockings. . Denies any other pedal complaints. Denies n/v/f/c.   Past Medical History:  Diagnosis Date   Anxiety    on meds   Asthma    occassionally   Cataract    bilateral-sx    Colon polyp    adenomatous   COPD (chronic obstructive pulmonary disease) ([place])    Depression    on meds   Diverticulosis    GERD (gastroesophageal reflux disease)    on meds   Hemorrhoids    Hyperlipidemia    on meds   Hypertension    on meds   IBS (irritable bowel syndrome)    Macular degeneration    Dr. Hoyle Sauer   Overactive bladder    Right arm fracture    2018   Rosacea    Sleep apnea    uses cpap   Stroke ([place])    hx of numerous TIA per MRI   Thyroid disease    on meds    Objective:  Physical Exam: Vascular: DP/PT pulses 2/4 bilateral. CFT <3 seconds. Normal hair growth on digits. No edema.  Skin. No lacerations or abrasions bilateral feet.  Musculoskeletal: MMT 5/5 bilateral lower extremities in DF, PF, Inversion and Eversion. Deceased ROM in DF of ankle joint. Tender to dorsal third and fourth metatarsals and along extensor tendons. Pain with plantarflexion of the digits.  Neurological: Sensation intact to light touch.   Assessment:   1. Stress reaction of right foot with routine healing, subsequent encounter       Plan:  Patient was evaluated and treated and all questions answered. -Xrays reviewed. Periosteal reaction noted around fourth and mildly around third metatarsals.Concern for possible stress reaction.  -Discussed treatement options for stress reaction vs extensor tendonitis;  risks, alternatives, and benefits explained.  -Continue CAM boot for another few weeks.  -Recommend protection, rest, ice, elevation daily until symptoms improve -Rx pain med/antinflammatories as needed  -Patient to return to office in 4 weeks for serial x-rays to assess healing  or sooner if condition worsens.   Lorenda Peck, DPM

## 2021-09-14 ENCOUNTER — Other Ambulatory Visit: Payer: Self-pay

## 2021-09-14 ENCOUNTER — Telehealth: Payer: Self-pay

## 2021-09-14 MED ORDER — CITALOPRAM HYDROBROMIDE 40 MG PO TABS: ORAL_TABLET | ORAL | 0 refills | Status: DC

## 2021-09-14 NOTE — Telephone Encounter (Signed)
Encourage patient to contact the pharmacy for refills or they can request refills through [facility]  (Please schedule appointment if patient has not been seen in over a year)    WHAT [place] THIS SENT TO: [facility] [place], [place] - [address] AT [place] ([place]) 40 MG tablet   NOTES/COMMENTS FROM PATIENT:      [place] office please notify patient: It takes 48-72 hours to process rx refill requests Ask patient to call pharmacy to ensure rx is ready before heading there.

## 2021-09-14 NOTE — Telephone Encounter (Signed)
Refill sent.

## 2021-09-21 ENCOUNTER — Ambulatory Visit (INDEPENDENT_AMBULATORY_CARE_PROVIDER_SITE_OTHER): Payer: PPO

## 2021-09-21 ENCOUNTER — Other Ambulatory Visit: Payer: Self-pay

## 2021-09-21 DIAGNOSIS — Z1231 Encounter for screening mammogram for malignant neoplasm of breast: Secondary | ICD-10-CM | POA: Diagnosis not present

## 2021-09-28 DIAGNOSIS — H6123 Impacted cerumen, bilateral: Secondary | ICD-10-CM | POA: Diagnosis not present

## 2021-09-28 DIAGNOSIS — H608X3 Other otitis externa, bilateral: Secondary | ICD-10-CM | POA: Diagnosis not present

## 2021-10-07 ENCOUNTER — Ambulatory Visit: Payer: PPO | Admitting: Podiatry

## 2021-10-17 ENCOUNTER — Ambulatory Visit: Payer: PPO | Admitting: Family Medicine

## 2021-10-17 ENCOUNTER — Ambulatory Visit (INDEPENDENT_AMBULATORY_CARE_PROVIDER_SITE_OTHER): Payer: PPO | Admitting: Registered Nurse

## 2021-10-17 DIAGNOSIS — I10 Essential (primary) hypertension: Secondary | ICD-10-CM

## 2021-10-17 NOTE — Progress Notes (Addendum)
Patient was in office to get a BP check per Maximiano Coss . Patient BP was 152/82 ? ?Pt needs follow up BP visit and we can discuss medication changes at that time ?

## 2021-10-21 ENCOUNTER — Ambulatory Visit (INDEPENDENT_AMBULATORY_CARE_PROVIDER_SITE_OTHER): Payer: PPO | Admitting: Family Medicine

## 2021-10-21 ENCOUNTER — Encounter: Payer: Self-pay | Admitting: Family Medicine

## 2021-10-21 ENCOUNTER — Other Ambulatory Visit: Payer: Self-pay

## 2021-10-21 VITALS — BP 139/59 | HR 69 | Temp 98.1°F | Resp 17 | Ht 65.0 in | Wt 241.0 lb

## 2021-10-21 DIAGNOSIS — E785 Hyperlipidemia, unspecified: Secondary | ICD-10-CM | POA: Diagnosis not present

## 2021-10-21 DIAGNOSIS — I10 Essential (primary) hypertension: Secondary | ICD-10-CM

## 2021-10-21 DIAGNOSIS — E038 Other specified hypothyroidism: Secondary | ICD-10-CM | POA: Diagnosis not present

## 2021-10-21 DIAGNOSIS — N318 Other neuromuscular dysfunction of bladder: Secondary | ICD-10-CM

## 2021-10-21 LAB — BASIC METABOLIC PANEL
BUN: 16 mg/dL (ref 6–23)
CO2: 29 mEq/L (ref 19–32)
Calcium: 9.2 mg/dL (ref 8.4–10.5)
Chloride: 106 mEq/L (ref 96–112)
Creatinine, Ser: 0.91 mg/dL (ref 0.40–1.20)
GFR: 61.71 mL/min (ref >60.00)
Glucose, Bld: 93 mg/dL (ref 70–99)
Potassium: 4 mEq/L (ref 3.5–5.1)
Sodium: 143 mEq/L (ref 135–145)

## 2021-10-21 LAB — LIPID PANEL
Cholesterol: 164 mg/dL (ref 0–200)
HDL: 51.4 mg/dL (ref >39.00)
NonHDL: 112.49
Total CHOL/HDL Ratio: 3
Triglycerides: 223 mg/dL — ABNORMAL HIGH (ref 0.0–149.0)
VLDL: 44.6 mg/dL — ABNORMAL HIGH (ref 0.0–40.0)

## 2021-10-21 LAB — CBC WITH DIFFERENTIAL/PLATELET
Basophils Absolute: 0 10*3/uL (ref 0.0–0.1)
Basophils Relative: 1 % (ref 0.0–3.0)
Eosinophils Absolute: 0.1 10*3/uL (ref 0.0–0.7)
Eosinophils Relative: 2.6 % (ref 0.0–5.0)
HCT: 41.6 % (ref 36.0–46.0)
Hemoglobin: 13.9 g/dL (ref 12.0–15.0)
Lymphocytes Relative: 22.5 % (ref 12.0–46.0)
Lymphs Abs: 1.2 10*3/uL (ref 0.7–4.0)
MCHC: 33.5 g/dL (ref 30.0–36.0)
MCV: 96.4 fl (ref 78.0–100.0)
Monocytes Absolute: 0.4 10*3/uL (ref 0.1–1.0)
Monocytes Relative: 8 % (ref 3.0–12.0)
Neutro Abs: 3.4 10*3/uL (ref 1.4–7.7)
Neutrophils Relative %: 65.9 % (ref 43.0–77.0)
Platelets: 154 10*3/uL (ref 150.0–400.0)
RBC: 4.31 Mil/uL (ref 3.87–5.11)
RDW: 13 % (ref 11.5–15.5)
WBC: 5.2 10*3/uL (ref 4.0–10.5)

## 2021-10-21 LAB — HEPATIC FUNCTION PANEL
ALT: 15 U/L (ref 0–35)
AST: 17 U/L (ref 0–37)
Albumin: 4.3 g/dL (ref 3.5–5.2)
Alkaline Phosphatase: 49 U/L (ref 39–117)
Bilirubin, Direct: 0.1 mg/dL (ref 0.0–0.3)
Total Bilirubin: 0.7 mg/dL (ref 0.2–1.2)
Total Protein: 6 g/dL (ref 6.0–8.3)

## 2021-10-21 LAB — LDL CHOLESTEROL, DIRECT: Direct LDL: 81 mg/dL

## 2021-10-21 MED ORDER — CITALOPRAM HYDROBROMIDE 40 MG PO TABS: ORAL_TABLET | ORAL | 1 refills | Status: DC

## 2021-10-21 MED ORDER — LEVOTHYROXINE SODIUM 100 MCG PO TABS: ORAL_TABLET | ORAL | 1 refills | Status: DC

## 2021-10-21 MED ORDER — CLONAZEPAM 0.5 MG PO TABS: ORAL_TABLET | ORAL | 1 refills | Status: DC

## 2021-10-21 MED ORDER — OXYBUTYNIN CHLORIDE 5 MG PO TABS: 5.0000 mg | ORAL_TABLET | Freq: Three times a day (TID) | ORAL | 0 refills | Status: DC

## 2021-10-21 MED ORDER — FUROSEMIDE 20 MG PO TABS: 40.0000 mg | ORAL_TABLET | Freq: Every day | ORAL | 1 refills | Status: DC

## 2021-10-21 MED ORDER — SIMVASTATIN 40 MG PO TABS: ORAL_TABLET | ORAL | 1 refills | Status: DC

## 2021-10-21 MED ORDER — LISINOPRIL 40 MG PO TABS: 40.0000 mg | ORAL_TABLET | Freq: Every day | ORAL | 1 refills | Status: DC

## 2021-10-21 NOTE — Progress Notes (Signed)
? ?  Subjective:  ? ? Patient ID: Ashley Woods, female    DOB: 1945/12/03, 76 y.o.   MRN: [id number] ? ?HPI ?HTN- chronic problem, currently on Lisinopril '40mg'$  daily (this was increased in November), Lasix '20mg'$  daily.  Pt reports BP will fluctuate from 140-180.  Pt reports she can get light headed when BPs are elevated.  Denies blurry or double vision.  Denies CP.  Has SOB at baseline- uses Albuterol as needed.   ? ?Hypothyroid- chronic problem, on Levothyroxine 168mg daily.  No changes to skin/hair/nails ? ?Hyperlipidemia- chronic problem, on Simvastatin daily.  Has abd pain due to IBS.  No N/V. ? ?Obesity- pt's BMI is 40.1 which is stable since last visit in June.  Pt is trying to increase her walking to build stamina.  Plans to start [employer]. ? ?OAB- pt feels that her Oxybutynin needs to be increased ? ? ?Review of Systems ?For ROS see HPI  ? ?This visit occurred during the SARS-CoV-2 public health emergency.  Safety protocols were in place, including screening questions prior to the visit, additional usage of staff PPE, and extensive cleaning of exam room while observing appropriate contact time as indicated for disinfecting solutions.   ?   ?Objective:  ? Physical Exam ?Vitals reviewed.  ?Constitutional:   ?   General: She is not in acute distress. ?   Appearance: She is well-developed. She is obese. She is not ill-appearing.  ?HENT:  ?   Head: Normocephalic and atraumatic.  ?Eyes:  ?   Conjunctiva/sclera: Conjunctivae normal.  ?   Pupils: Pupils are equal, round, and reactive to light.  ?Neck:  ?   Thyroid: No thyromegaly.  ?Cardiovascular:  ?   Rate and Rhythm: Normal rate and regular rhythm.  ?   Pulses: Normal pulses.  ?   Heart sounds: Normal heart sounds. No murmur heard. ?Pulmonary:  ?   Effort: Pulmonary effort is normal. No respiratory distress.  ?   Breath sounds: Normal breath sounds.  ?Abdominal:  ?   General: There is no distension.  ?   Palpations: Abdomen is soft.  ?   Tenderness: There  is no abdominal tenderness.  ?Musculoskeletal:  ?   Cervical back: Normal range of motion and neck supple.  ?   Right lower leg: No edema.  ?   Left lower leg: No edema.  ?Lymphadenopathy:  ?   Cervical: No cervical adenopathy.  ?Skin: ?   General: Skin is warm and dry.  ?Neurological:  ?   Mental Status: She is alert and oriented to person, place, and time.  ?Psychiatric:     ?   Behavior: Behavior normal.  ? ? ? ? ? ?   ?Assessment & Plan:  ? ? ?

## 2021-10-21 NOTE — Patient Instructions (Addendum)
Schedule your complete physical in 6 months ?We'll notify you of your lab results and make any changes if needed ?ADD the Amlodipine once daily ?Continue the Lisinopril '40mg'$  daily ?INCREASE the Oxybutynin to 3x/day ?Continue to work on healthy diet and regular physical activity ?Call with any questions or concerns ?Stay Safe!  Stay Healthy! ?Have a great vacation!! ?

## 2021-10-23 ENCOUNTER — Other Ambulatory Visit: Payer: Self-pay | Admitting: Family Medicine

## 2021-10-24 MED ORDER — AMLODIPINE BESYLATE 5 MG PO TABS: 5.0000 mg | ORAL_TABLET | Freq: Every day | ORAL | 3 refills | Status: DC

## 2021-10-24 NOTE — Assessment & Plan Note (Signed)
Chronic problem.  Currently asymptomatic on Levothyroxine 100mcg daily.  Check labs.  Adjust meds prn  

## 2021-10-24 NOTE — Assessment & Plan Note (Signed)
Chronic problem.  Tolerating Simvastatin daily.  Check labs.  Adjust meds prn  ?

## 2021-10-24 NOTE — Assessment & Plan Note (Signed)
Weight and BMI are stable since last visit.  She is trying to increase her physical activity to build stamina.  Plans to join [employer] when she returns from vacation early next month.  Will follow. ?

## 2021-10-24 NOTE — Assessment & Plan Note (Signed)
Increase oxybutynin to TID per pt request. ?

## 2021-10-24 NOTE — Assessment & Plan Note (Addendum)
Chronic problem.  Adequate control today.  Her lisinopril was increased to '40mg'$  daily in November when she saw cards.  Given that home BPs fluctuate from 140-180 will add low dose Amlodipine to bring BP into normal range.  Pt expressed understanding and is in agreement w/ plan.  ?

## 2021-10-25 LAB — TSH: TSH: 2.96 u[IU]/mL (ref 0.35–5.50)

## 2021-10-26 ENCOUNTER — Telehealth: Payer: Self-pay

## 2021-10-26 NOTE — Telephone Encounter (Signed)
-----   Message from Midge Minium, MD sent at 10/25/2021  2:37 PM EDT ----- ?Labs are stable and look good w/ exception of elevated triglycerides (fatty part of blood).  This will improve w/ healthy diet and regular physical activity ?

## 2021-10-26 NOTE — Telephone Encounter (Signed)
Spoke to patient, she is aware of labs ?

## 2021-10-27 DIAGNOSIS — G4733 Obstructive sleep apnea (adult) (pediatric): Secondary | ICD-10-CM | POA: Diagnosis not present

## 2021-11-02 DIAGNOSIS — Z882 Allergy status to sulfonamides status: Secondary | ICD-10-CM | POA: Diagnosis not present

## 2021-11-02 DIAGNOSIS — Z885 Allergy status to narcotic agent status: Secondary | ICD-10-CM | POA: Diagnosis not present

## 2021-11-02 DIAGNOSIS — Z886 Allergy status to analgesic agent status: Secondary | ICD-10-CM | POA: Diagnosis not present

## 2021-11-02 DIAGNOSIS — H608X3 Other otitis externa, bilateral: Secondary | ICD-10-CM | POA: Diagnosis not present

## 2021-11-02 DIAGNOSIS — H6123 Impacted cerumen, bilateral: Secondary | ICD-10-CM | POA: Diagnosis not present

## 2021-11-02 DIAGNOSIS — H938X1 Other specified disorders of right ear: Secondary | ICD-10-CM | POA: Diagnosis not present

## 2021-11-02 DIAGNOSIS — Z881 Allergy status to other antibiotic agents status: Secondary | ICD-10-CM | POA: Diagnosis not present

## 2021-11-02 DIAGNOSIS — H6122 Impacted cerumen, left ear: Secondary | ICD-10-CM | POA: Diagnosis not present

## 2021-12-02 DIAGNOSIS — Z888 Allergy status to other drugs, medicaments and biological substances status: Secondary | ICD-10-CM | POA: Diagnosis not present

## 2021-12-02 DIAGNOSIS — Z885 Allergy status to narcotic agent status: Secondary | ICD-10-CM | POA: Diagnosis not present

## 2021-12-02 DIAGNOSIS — K589 Irritable bowel syndrome without diarrhea: Secondary | ICD-10-CM | POA: Diagnosis not present

## 2021-12-02 DIAGNOSIS — K5900 Constipation, unspecified: Secondary | ICD-10-CM | POA: Diagnosis not present

## 2021-12-02 DIAGNOSIS — K582 Mixed irritable bowel syndrome: Secondary | ICD-10-CM | POA: Diagnosis not present

## 2021-12-02 DIAGNOSIS — R159 Full incontinence of feces: Secondary | ICD-10-CM | POA: Diagnosis not present

## 2021-12-02 DIAGNOSIS — Z882 Allergy status to sulfonamides status: Secondary | ICD-10-CM | POA: Diagnosis not present

## 2021-12-02 DIAGNOSIS — Z881 Allergy status to other antibiotic agents status: Secondary | ICD-10-CM | POA: Diagnosis not present

## 2021-12-02 DIAGNOSIS — Z886 Allergy status to analgesic agent status: Secondary | ICD-10-CM | POA: Diagnosis not present

## 2021-12-02 DIAGNOSIS — R1031 Right lower quadrant pain: Secondary | ICD-10-CM | POA: Diagnosis not present

## 2021-12-02 DIAGNOSIS — R152 Fecal urgency: Secondary | ICD-10-CM | POA: Diagnosis not present

## 2021-12-05 ENCOUNTER — Telehealth: Payer: Self-pay

## 2021-12-05 DIAGNOSIS — G5793 Unspecified mononeuropathy of bilateral lower limbs: Secondary | ICD-10-CM

## 2021-12-05 NOTE — Telephone Encounter (Signed)
Patient called in stating that she would like a referral to [facility] for pain management due to her neuropathy.  ?

## 2021-12-06 NOTE — Telephone Encounter (Signed)
Referral sent to  ? ?[facility] ?Formerly known as [facility] ?[address] ? ?[address] ? ?[place], [place] [address] ? ?[phone] ? ?Pt is aware  ?

## 2021-12-06 NOTE — Telephone Encounter (Signed)
Pt is asking for a referral to [facility] for pain management for neuropathy . Please advise  ?

## 2021-12-06 NOTE — Telephone Encounter (Signed)
Referral placed.

## 2021-12-07 DIAGNOSIS — K582 Mixed irritable bowel syndrome: Secondary | ICD-10-CM | POA: Diagnosis not present

## 2021-12-20 ENCOUNTER — Telehealth: Payer: Self-pay

## 2021-12-20 DIAGNOSIS — G8929 Other chronic pain: Secondary | ICD-10-CM

## 2021-12-20 NOTE — Telephone Encounter (Signed)
MEDICATION: HYDROcodone-acetaminophen (NORCO/VICODIN) 5-325 MG tablet   PHARMACY:  [place] [place] - [address] AT [facility] [address]  Comments:   **Let patient know to contact pharmacy at the end of the day to make sure medication is ready. **  ** Please notify patient to allow 48-72 hours to process**  **Encourage patient to contact the pharmacy for refills or they can request refills through [facility]**

## 2021-12-21 MED ORDER — HYDROCODONE-ACETAMINOPHEN 5-325 MG PO TABS: 1.0000 | ORAL_TABLET | Freq: Four times a day (QID) | ORAL | 0 refills | Status: DC | PRN

## 2021-12-21 NOTE — Telephone Encounter (Signed)
Prescription sent at pt's request 

## 2021-12-21 NOTE — Addendum Note (Signed)
Addended by: Midge Minium on: 12/21/2021 07:30 AM   Modules accepted: Orders

## 2021-12-27 ENCOUNTER — Telehealth: Payer: Self-pay | Admitting: Family Medicine

## 2021-12-27 NOTE — Telephone Encounter (Signed)
Left message for patient to call back and schedule Medicare Annual Wellness Visit (AWV).   Please offer to do virtually or by telephone.  Left office number and my jabber #[phone].  Last AWV:11/01/2020  Please schedule at anytime with Nurse Health Advisor.   

## 2022-01-05 DIAGNOSIS — G629 Polyneuropathy, unspecified: Secondary | ICD-10-CM | POA: Diagnosis not present

## 2022-01-05 DIAGNOSIS — R419 Unspecified symptoms and signs involving cognitive functions and awareness: Secondary | ICD-10-CM | POA: Diagnosis not present

## 2022-01-05 DIAGNOSIS — R7989 Other specified abnormal findings of blood chemistry: Secondary | ICD-10-CM | POA: Diagnosis not present

## 2022-01-08 DIAGNOSIS — Z0389 Encounter for observation for other suspected diseases and conditions ruled out: Secondary | ICD-10-CM | POA: Diagnosis not present

## 2022-01-08 DIAGNOSIS — G3184 Mild cognitive impairment, so stated: Secondary | ICD-10-CM | POA: Diagnosis not present

## 2022-01-12 DIAGNOSIS — G629 Polyneuropathy, unspecified: Secondary | ICD-10-CM | POA: Diagnosis not present

## 2022-01-12 DIAGNOSIS — E8581 Light chain (AL) amyloidosis: Secondary | ICD-10-CM | POA: Diagnosis not present

## 2022-01-12 DIAGNOSIS — D8989 Other specified disorders involving the immune mechanism, not elsewhere classified: Secondary | ICD-10-CM | POA: Diagnosis not present

## 2022-01-16 ENCOUNTER — Telehealth: Payer: Self-pay | Admitting: Family Medicine

## 2022-01-16 NOTE — Telephone Encounter (Signed)
(  FYI) Pt called to inform you about her lab work she is getting done with [facility].

## 2022-01-16 NOTE — Telephone Encounter (Signed)
I read her hematology/oncology consult note and will be following along.  Sending good thoughts!!

## 2022-01-23 ENCOUNTER — Other Ambulatory Visit: Payer: Self-pay

## 2022-01-23 DIAGNOSIS — E038 Other specified hypothyroidism: Secondary | ICD-10-CM

## 2022-01-23 DIAGNOSIS — E785 Hyperlipidemia, unspecified: Secondary | ICD-10-CM

## 2022-01-23 MED ORDER — LEVOTHYROXINE SODIUM 100 MCG PO TABS: ORAL_TABLET | ORAL | 1 refills | Status: DC

## 2022-01-24 DIAGNOSIS — R9349 Abnormal radiologic findings on diagnostic imaging of other urinary organs: Secondary | ICD-10-CM | POA: Diagnosis not present

## 2022-01-24 DIAGNOSIS — D8989 Other specified disorders involving the immune mechanism, not elsewhere classified: Secondary | ICD-10-CM | POA: Diagnosis not present

## 2022-01-24 DIAGNOSIS — K3189 Other diseases of stomach and duodenum: Secondary | ICD-10-CM | POA: Diagnosis not present

## 2022-01-26 DIAGNOSIS — Z881 Allergy status to other antibiotic agents status: Secondary | ICD-10-CM | POA: Diagnosis not present

## 2022-01-26 DIAGNOSIS — Z885 Allergy status to narcotic agent status: Secondary | ICD-10-CM | POA: Diagnosis not present

## 2022-01-26 DIAGNOSIS — R768 Other specified abnormal immunological findings in serum: Secondary | ICD-10-CM | POA: Diagnosis not present

## 2022-01-26 DIAGNOSIS — Z886 Allergy status to analgesic agent status: Secondary | ICD-10-CM | POA: Diagnosis not present

## 2022-01-26 DIAGNOSIS — Z882 Allergy status to sulfonamides status: Secondary | ICD-10-CM | POA: Diagnosis not present

## 2022-01-26 DIAGNOSIS — D8989 Other specified disorders involving the immune mechanism, not elsewhere classified: Secondary | ICD-10-CM | POA: Diagnosis not present

## 2022-02-02 DIAGNOSIS — D472 Monoclonal gammopathy: Secondary | ICD-10-CM | POA: Diagnosis not present

## 2022-02-06 DIAGNOSIS — R419 Unspecified symptoms and signs involving cognitive functions and awareness: Secondary | ICD-10-CM | POA: Diagnosis not present

## 2022-02-06 DIAGNOSIS — G608 Other hereditary and idiopathic neuropathies: Secondary | ICD-10-CM | POA: Diagnosis not present

## 2022-02-28 ENCOUNTER — Telehealth: Payer: Self-pay | Admitting: Family Medicine

## 2022-02-28 ENCOUNTER — Other Ambulatory Visit: Payer: Self-pay

## 2022-02-28 MED ORDER — AMLODIPINE BESYLATE 5 MG PO TABS: 5.0000 mg | ORAL_TABLET | Freq: Every day | ORAL | 3 refills | Status: DC

## 2022-02-28 NOTE — Telephone Encounter (Signed)
Encourage patient to contact the pharmacy for refills or they can request refills through [facility]  (Please schedule appointment if patient has not been seen in over a year)    [place] THIS SENT TO: [facility] [place] [address] AT [address]  MEDICATION NAME & DOSE: Amlodipine '5mg'$    NOTES/COMMENTS FROM PATIENT: Pt states that she is completely out of medication.      [place] office please notify patient: It takes 48-72 hours to process rx refill requests Ask patient to call pharmacy to ensure rx is ready before heading there.

## 2022-02-28 NOTE — Telephone Encounter (Signed)
Rx refill sent to pharmacy. 

## 2022-03-03 ENCOUNTER — Other Ambulatory Visit: Payer: Self-pay

## 2022-03-03 MED ORDER — OXYBUTYNIN CHLORIDE 5 MG PO TABS: 5.0000 mg | ORAL_TABLET | Freq: Three times a day (TID) | ORAL | 0 refills | Status: DC

## 2022-03-08 DIAGNOSIS — R152 Fecal urgency: Secondary | ICD-10-CM | POA: Diagnosis not present

## 2022-03-08 DIAGNOSIS — Z885 Allergy status to narcotic agent status: Secondary | ICD-10-CM | POA: Diagnosis not present

## 2022-03-08 DIAGNOSIS — Z881 Allergy status to other antibiotic agents status: Secondary | ICD-10-CM | POA: Diagnosis not present

## 2022-03-08 DIAGNOSIS — Z886 Allergy status to analgesic agent status: Secondary | ICD-10-CM | POA: Diagnosis not present

## 2022-03-08 DIAGNOSIS — R159 Full incontinence of feces: Secondary | ICD-10-CM | POA: Diagnosis not present

## 2022-03-08 DIAGNOSIS — Z882 Allergy status to sulfonamides status: Secondary | ICD-10-CM | POA: Diagnosis not present

## 2022-03-08 DIAGNOSIS — K582 Mixed irritable bowel syndrome: Secondary | ICD-10-CM | POA: Diagnosis not present

## 2022-03-09 ENCOUNTER — Telehealth: Payer: Self-pay | Admitting: Family Medicine

## 2022-03-09 NOTE — Telephone Encounter (Signed)
Left message for patient to call back and schedule Medicare Annual Wellness Visit (AWV).   Please offer to do virtually or by telephone.  Left office number and my jabber [phone].  Last AWV:11/01/2020  Please schedule at anytime with Nurse Health Advisor.

## 2022-03-22 ENCOUNTER — Other Ambulatory Visit: Payer: Self-pay | Admitting: Family Medicine

## 2022-03-22 DIAGNOSIS — G8929 Other chronic pain: Secondary | ICD-10-CM

## 2022-03-22 MED ORDER — HYDROCODONE-ACETAMINOPHEN 5-325 MG PO TABS
1.0000 | ORAL_TABLET | Freq: Four times a day (QID) | ORAL | 0 refills | Status: DC | PRN
Start: 2022-03-22 — End: 2022-07-20

## 2022-03-22 MED ORDER — OXYBUTYNIN CHLORIDE 5 MG PO TABS
5.0000 mg | ORAL_TABLET | Freq: Three times a day (TID) | ORAL | 3 refills | Status: DC
Start: 2022-03-22 — End: 2023-03-07

## 2022-03-22 NOTE — Progress Notes (Signed)
Pt was at her daughter's CPE today and asked for refills

## 2022-04-17 ENCOUNTER — Other Ambulatory Visit: Payer: Self-pay

## 2022-04-17 MED ORDER — FUROSEMIDE 20 MG PO TABS: 40.0000 mg | ORAL_TABLET | Freq: Every day | ORAL | 1 refills | Status: DC

## 2022-04-17 MED ORDER — CYANOCOBALAMIN 1000 MCG/ML IJ SOLN: INTRAMUSCULAR | 6 refills | Status: DC

## 2022-05-02 DIAGNOSIS — G4733 Obstructive sleep apnea (adult) (pediatric): Secondary | ICD-10-CM | POA: Diagnosis not present

## 2022-05-16 NOTE — Progress Notes (Signed)
HPI: FU dizziness and hypertension. Echocardiogram February 2023 showed normal LV function, mild left ventricular hypertrophy, grade 2 diastolic dysfunction, moderate left atrial enlargement, mild aortic stenosis with mean gradient 8 mmHg.  Carotid Dopplers February 2023 showed 40 to 59% bilateral stenosis.  Since last seen she denies dyspnea on exertion, orthopnea, PND, exertional chest pain or syncope.  Current Outpatient Medications  Medication Sig Dispense Refill   albuterol (PROVENTIL HFA;VENTOLIN HFA) 108 (90 Base) MCG/ACT inhaler Inhale into the lungs.     amLODipine (NORVASC) 5 MG tablet Take 1 tablet (5 mg total) by mouth daily. 30 tablet 3   benzonatate (TESSALON) 100 MG capsule Take 1 capsule (100 mg total) by mouth 2 (two) times daily as needed for cough. 20 capsule 0   calcium-vitamin D (OSCAL WITH D) 500-200 MG-UNIT tablet Take by mouth.     citalopram (CELEXA) 40 MG tablet TAKE 1 TABLET(40 MG) BY MOUTH DAILY 90 tablet 1   clonazePAM (KLONOPIN) 0.5 MG tablet TAKE 1 TABLET(0.5 MG) BY MOUTH AT BEDTIME AS NEEDED 30 tablet 1   cyanocobalamin (VITAMIN B12) 1000 MCG/ML injection INJECT 1 ML INTO THE MUSCLE EVERY 30 DAYS 1 mL 6   furosemide (LASIX) 20 MG tablet Take 2 tablets (40 mg total) by mouth daily. 180 tablet 1   HYDROcodone-acetaminophen (NORCO/VICODIN) 5-325 MG tablet Take 1 tablet by mouth every 6 (six) hours as needed. 60 tablet 0   levothyroxine (SYNTHROID) 100 MCG tablet TAKE 1 TABLET(100 MCG) BY MOUTH DAILY 90 tablet 1   lisinopril (ZESTRIL) 40 MG tablet Take 1 tablet (40 mg total) by mouth daily. TAKE 1 TABLET(30 MG) BY MOUTH DAILY 90 tablet 1   loperamide (IMODIUM A-D) 2 MG tablet Take 2 mg by mouth.      methylPREDNISolone (MEDROL DOSEPAK) 4 MG TBPK tablet Take as directed 21 tablet 0   omeprazole (PRILOSEC) 40 MG capsule TAKE 1 CAPSULE(40 MG) BY MOUTH DAILY 90 capsule 0   oxybutynin (DITROPAN) 5 MG tablet Take 1 tablet (5 mg total) by mouth 3 (three) times daily.  270 tablet 3   RESTASIS 0.05 % ophthalmic emulsion Place 1 drop into both eyes 2 (two) times daily.      simvastatin (ZOCOR) 40 MG tablet TAKE 1 TABLET(40 MG) BY MOUTH AT BEDTIME 90 tablet 1   SYRINGE-NEEDLE, DISP, 3 ML (BD SAFETYGLIDE SYRINGE/NEEDLE) 25G X 1" 3 ML MISC Please use one syringe and needle for each injection. 10 each 0   No current facility-administered medications for this visit.     Past Medical History:  Diagnosis Date   Anxiety    on meds   Asthma    occassionally   Cataract    bilateral-sx    Colon polyp    adenomatous   COPD (chronic obstructive pulmonary disease) ([place])    Depression    on meds   Diverticulosis    GERD (gastroesophageal reflux disease)    on meds   Hemorrhoids    Hyperlipidemia    on meds   Hypertension    on meds   IBS (irritable bowel syndrome)    Macular degeneration    Dr. Hoyle Sauer   Overactive bladder    Right arm fracture    2018   Rosacea    Sleep apnea    uses cpap   Stroke ([place])    hx of numerous TIA per MRI   Thyroid disease    on meds    Past Surgical History:  Procedure  Laterality Date   BLADDER SURGERY  2015   CATARACT EXTRACTION, BILATERAL  2016   CERVICAL FUSION  2000   COLONOSCOPY  2013   TA   HERNIA REPAIR  5277   umbilical   HYSTERECTOMY ABDOMINAL WITH SALPINGO-OOPHORECTOMY  1995   OOPHORECTOMY     PARTIAL HYSTERECTOMY     POLYPECTOMY  2013   TA   SHOULDER SURGERY Right 09/2019   TONSILLECTOMY  1960    Social History   Socioeconomic History   Marital status: Married    Spouse name: Not on file   Number of children: 2   Years of education: Not on file   Highest education level: Not on file  Occupational History   Occupation: currently on disability  Tobacco Use   Smoking status: Former    Packs/day: 1.00    Years: 45.00    Total pack years: 45.00    Types: E-cigarettes, Cigarettes    Quit date: 04/30/2013    Years since quitting: 9.0   Smokeless tobacco: Never   Tobacco comments:     former 1ppd x45 years, 09-13-17 report vaping  Vaping Use   Vaping Use: Some days  Substance and Sexual Activity   Alcohol use: No   Drug use: No   Sexual activity: Not on file  Other Topics Concern   Not on file  Social History Narrative   Husband Jenny Reichmann on disability.    Former housekeeper   1 year of college   Social Determinants of Health   Financial Resource Strain: [facility]  (11/01/2020)   Overall Financial Resource Strain (CARDIA)    Difficulty of Paying Living Expenses: Not hard at all  Food Insecurity: Not on file  Transportation Needs: No Transportation Needs (11/01/2020)   PRAPARE - Hydrologist (Medical): No    Lack of Transportation (Non-Medical): No  Physical Activity: Insufficiently Active (11/01/2020)   Exercise Vital Sign    Days of Exercise per Week: 3 days    Minutes of Exercise per Session: 40 min  Stress: Not on file  Social Connections: Moderately Isolated (11/01/2020)   Social Connection and Isolation Panel [NHANES]    Frequency of Communication with Friends and Family: More than three times a week    Frequency of Social Gatherings with Friends and Family: More than three times a week    Attends Religious Services: Never    Marine scientist or Organizations: No    Attends Archivist Meetings: Never    Marital Status: Married  Human resources officer Violence: Not At Risk (11/01/2020)   Humiliation, Afraid, Rape, and Kick questionnaire    Fear of Current or Ex-Partner: No    Emotionally Abused: No    Physically Abused: No    Sexually Abused: No    Family History  Problem Relation Age of Onset   Dementia Father    Heart disease Daughter    Yves Dill Parkinson White syndrome Daughter    Colon cancer Neg Hx    Inflammatory bowel disease Neg Hx    Esophageal cancer Neg Hx    Stomach cancer Neg Hx    Rectal cancer Neg Hx    Colon polyps Neg Hx     ROS: no fevers or chills, productive cough, hemoptysis, dysphasia,  odynophagia, melena, hematochezia, dysuria, hematuria, rash, seizure activity, orthopnea, PND, pedal edema, claudication. Remaining systems are negative.  Physical Exam: Well-developed well-nourished in no acute distress.  Skin is warm and dry.  HEENT  is normal.  Neck is supple.  Chest is clear to auscultation with normal expansion.  Cardiovascular exam is regular rate and rhythm.  1/6 systolic murmur left sternal border. Abdominal exam nontender or distended. No masses palpated. Extremities show no edema. neuro grossly intact  ECG-normal sinus rhythm at a rate of 68, no ST changes.  Personally reviewed  A/P  1 mild aortic stenosis-we will plan follow-up echoes in the future.  Note mean gradient only 8 mmHg.  2 carotid artery disease-plan follow-up carotid Dopplers February 2024.  3 hypertension-patient's blood pressure is borderline.  Have asked her to follow this at home and we can advance Norvasc if necessary.  4 history of dizziness-no recent symptoms.  5 sleep apnea-continue CPAP.  6 hyperlipidemia-continue statin.  Kirk Ruths, MD

## 2022-05-17 ENCOUNTER — Other Ambulatory Visit: Payer: Self-pay

## 2022-05-17 MED ORDER — CITALOPRAM HYDROBROMIDE 40 MG PO TABS: ORAL_TABLET | ORAL | 1 refills | Status: DC

## 2022-05-22 DIAGNOSIS — R5383 Other fatigue: Secondary | ICD-10-CM | POA: Diagnosis not present

## 2022-05-22 DIAGNOSIS — M5416 Radiculopathy, lumbar region: Secondary | ICD-10-CM | POA: Diagnosis not present

## 2022-05-22 DIAGNOSIS — G629 Polyneuropathy, unspecified: Secondary | ICD-10-CM | POA: Diagnosis not present

## 2022-05-22 DIAGNOSIS — D472 Monoclonal gammopathy: Secondary | ICD-10-CM | POA: Diagnosis not present

## 2022-05-23 DIAGNOSIS — G4733 Obstructive sleep apnea (adult) (pediatric): Secondary | ICD-10-CM | POA: Diagnosis not present

## 2022-05-29 ENCOUNTER — Ambulatory Visit: Payer: PPO | Admitting: Cardiology

## 2022-05-29 ENCOUNTER — Encounter: Payer: Self-pay | Admitting: Cardiology

## 2022-05-29 VITALS — BP 139/70 | HR 68 | Ht 65.0 in | Wt 209.4 lb

## 2022-05-29 DIAGNOSIS — E7800 Pure hypercholesterolemia, unspecified: Secondary | ICD-10-CM

## 2022-05-29 DIAGNOSIS — I6523 Occlusion and stenosis of bilateral carotid arteries: Secondary | ICD-10-CM

## 2022-05-29 DIAGNOSIS — I10 Essential (primary) hypertension: Secondary | ICD-10-CM | POA: Diagnosis not present

## 2022-05-29 NOTE — Patient Instructions (Signed)
    Follow-Up: At  [facility], you and your health needs are our priority.  As part of our continuing mission to provide you with exceptional heart care, we have created designated Provider Care Teams.  These Care Teams include your primary Cardiologist (physician) and Advanced Practice Providers (APPs -  Physician Assistants and Nurse Practitioners) who all work together to provide you with the care you need, when you need it.  We recommend signing up for the patient portal called "MyChart".  Sign up information is provided on this After Visit Summary.  MyChart is used to connect with patients for Virtual Visits (Telemedicine).  Patients are able to view lab/test results, encounter notes, upcoming appointments, etc.  Non-urgent messages can be sent to your provider as well.   To learn more about what you can do with MyChart, go to [email or web address].    Your next appointment:   12 month(s)  The format for your next appointment:   In Person  Provider:   Brian Crenshaw MD          

## 2022-05-30 DIAGNOSIS — R29898 Other symptoms and signs involving the musculoskeletal system: Secondary | ICD-10-CM | POA: Diagnosis not present

## 2022-05-30 DIAGNOSIS — R419 Unspecified symptoms and signs involving cognitive functions and awareness: Secondary | ICD-10-CM | POA: Diagnosis not present

## 2022-05-30 DIAGNOSIS — Z79899 Other long term (current) drug therapy: Secondary | ICD-10-CM | POA: Diagnosis not present

## 2022-05-30 DIAGNOSIS — M48061 Spinal stenosis, lumbar region without neurogenic claudication: Secondary | ICD-10-CM | POA: Diagnosis not present

## 2022-05-30 DIAGNOSIS — G629 Polyneuropathy, unspecified: Secondary | ICD-10-CM | POA: Diagnosis not present

## 2022-06-02 ENCOUNTER — Telehealth: Payer: Self-pay

## 2022-06-02 MED ORDER — CLONAZEPAM 0.5 MG PO TABS: ORAL_TABLET | ORAL | 1 refills | Status: DC

## 2022-06-02 NOTE — Telephone Encounter (Signed)
Prescription sent to pharmacy.

## 2022-06-02 NOTE — Telephone Encounter (Signed)
Clonazepam 0.5 mg LOV: 10/21/21 Last Refill:10/21/21 Upcoming appt: none

## 2022-06-16 ENCOUNTER — Other Ambulatory Visit: Payer: Self-pay | Admitting: Family Medicine

## 2022-07-12 ENCOUNTER — Ambulatory Visit (INDEPENDENT_AMBULATORY_CARE_PROVIDER_SITE_OTHER): Payer: PPO | Admitting: *Deleted

## 2022-07-12 DIAGNOSIS — Z0000 Encounter for general adult medical examination without abnormal findings: Secondary | ICD-10-CM | POA: Diagnosis not present

## 2022-07-12 NOTE — Patient Instructions (Signed)
Ms. Ashley Woods , Thank you for taking time to come for your Medicare Wellness Visit. I appreciate your ongoing commitment to your health goals. Please review the following plan we discussed and let me know if I can assist you in the future.   These are the goals we discussed:  Goals      Patient Stated     Drink more water     Weight (lb) < 200 lb (90.7 kg)        This is a list of the screening recommended for you and due dates:  Health Maintenance  Topic Date Due   DTaP/Tdap/Td vaccine (1 - Tdap) Never done   Screening for Lung Cancer  Never done   COVID-19 Vaccine (2 - Janssen risk series) 07/28/2022*   Zoster (Shingles) Vaccine (1 of 2) 10/11/2022*   Mammogram  09/21/2022   Medicare Annual Wellness Visit  07/13/2023   Colon Cancer Screening  04/01/2025   Pneumonia Vaccine  Completed   Flu Shot  Completed   DEXA scan (bone density measurement)  Completed   Hepatitis C Screening: USPSTF Recommendation to screen - Ages 87-79 yo.  Completed   HPV Vaccine  Aged Out  *Topic was postponed. The date shown is not the original due date.    Advanced directives: on file       Preventive Care 65 Years and Older, Female Preventive care refers to lifestyle choices and visits with your health care provider that can promote health and wellness. What does preventive care include? A yearly physical exam. This is also called an annual well check. Dental exams once or twice a year. Routine eye exams. Ask your health care provider how often you should have your eyes checked. Personal lifestyle choices, including: Daily care of your teeth and gums. Regular physical activity. Eating a healthy diet. Avoiding tobacco and drug use. Limiting alcohol use. Practicing safe sex. Taking low-dose aspirin every day. Taking vitamin and mineral supplements as recommended by your health care provider. What happens during an annual well check? The services and screenings done by your health care  provider during your annual well check will depend on your age, overall health, lifestyle risk factors, and family history of disease. Counseling  Your health care provider may ask you questions about your: Alcohol use. Tobacco use. Drug use. Emotional well-being. Home and relationship well-being. Sexual activity. Eating habits. History of falls. Memory and ability to understand (cognition). Work and work Statistician. Reproductive health. Screening  You may have the following tests or measurements: Height, weight, and BMI. Blood pressure. Lipid and cholesterol levels. These may be checked every 5 years, or more frequently if you are over 61 years old. Skin check. Lung cancer screening. You may have this screening every year starting at age 35 if you have a 30-pack-year history of smoking and currently smoke or have quit within the past 15 years. Fecal occult blood test (FOBT) of the stool. You may have this test every year starting at age 74. Flexible sigmoidoscopy or colonoscopy. You may have a sigmoidoscopy every 5 years or a colonoscopy every 10 years starting at age 64. Hepatitis C blood test. Hepatitis B blood test. Sexually transmitted disease (STD) testing. Diabetes screening. This is done by checking your blood sugar (glucose) after you have not eaten for a while (fasting). You may have this done every 1-3 years. Bone density scan. This is done to screen for osteoporosis. You may have this done starting at age 108. Mammogram. This may  be done every 1-2 years. Talk to your health care provider about how often you should have regular mammograms. Talk with your health care provider about your test results, treatment options, and if necessary, the need for more tests. Vaccines  Your health care provider may recommend certain vaccines, such as: Influenza vaccine. This is recommended every year. Tetanus, diphtheria, and acellular pertussis (Tdap, Td) vaccine. You may need a Td  booster every 10 years. Zoster vaccine. You may need this after age 55. Pneumococcal 13-valent conjugate (PCV13) vaccine. One dose is recommended after age 42. Pneumococcal polysaccharide (PPSV23) vaccine. One dose is recommended after age 4. Talk to your health care provider about which screenings and vaccines you need and how often you need them. This information is not intended to replace advice given to you by your health care provider. Make sure you discuss any questions you have with your health care provider. Document Released: 08/13/2015 Document Revised: 04/05/2016 Document Reviewed: 05/18/2015 Elsevier Interactive Patient Education  2017 [employer] Prevention in the Home Falls can cause injuries. They can happen to people of all ages. There are many things you can do to make your home safe and to help prevent falls. What can I do on the outside of my home? Regularly fix the edges of walkways and driveways and fix any cracks. Remove anything that might make you trip as you walk through a door, such as a raised step or threshold. Trim any bushes or trees on the path to your home. Use bright outdoor lighting. Clear any walking paths of anything that might make someone trip, such as rocks or tools. Regularly check to see if handrails are loose or broken. Make sure that both sides of any steps have handrails. Any raised decks and porches should have guardrails on the edges. Have any leaves, snow, or ice cleared regularly. Use sand or salt on walking paths during winter. Clean up any spills in your garage right away. This includes oil or grease spills. What can I do in the bathroom? Use night lights. Install grab bars by the toilet and in the tub and shower. Do not use towel bars as grab bars. Use non-skid mats or decals in the tub or shower. If you need to sit down in the shower, use a plastic, non-slip stool. Keep the floor dry. Clean up any water that spills on the floor  as soon as it happens. Remove soap buildup in the tub or shower regularly. Attach bath mats securely with double-sided non-slip rug tape. Do not have throw rugs and other things on the floor that can make you trip. What can I do in the bedroom? Use night lights. Make sure that you have a light by your bed that is easy to reach. Do not use any sheets or blankets that are too big for your bed. They should not hang down onto the floor. Have a firm chair that has side arms. You can use this for support while you get dressed. Do not have throw rugs and other things on the floor that can make you trip. What can I do in the kitchen? Clean up any spills right away. Avoid walking on wet floors. Keep items that you use a lot in easy-to-reach places. If you need to reach something above you, use a strong step stool that has a grab bar. Keep electrical cords out of the way. Do not use floor polish or wax that makes floors slippery. If you must use  wax, use non-skid floor wax. Do not have throw rugs and other things on the floor that can make you trip. What can I do with my stairs? Do not leave any items on the stairs. Make sure that there are handrails on both sides of the stairs and use them. Fix handrails that are broken or loose. Make sure that handrails are as long as the stairways. Check any carpeting to make sure that it is firmly attached to the stairs. Fix any carpet that is loose or worn. Avoid having throw rugs at the top or bottom of the stairs. If you do have throw rugs, attach them to the floor with carpet tape. Make sure that you have a light switch at the top of the stairs and the bottom of the stairs. If you do not have them, ask someone to add them for you. What else can I do to help prevent falls? Wear shoes that: Do not have high heels. Have rubber bottoms. Are comfortable and fit you well. Are closed at the toe. Do not wear sandals. If you use a stepladder: Make sure that it is  fully opened. Do not climb a closed stepladder. Make sure that both sides of the stepladder are locked into place. Ask someone to hold it for you, if possible. Clearly mark and make sure that you can see: Any grab bars or handrails. First and last steps. Where the edge of each step is. Use tools that help you move around (mobility aids) if they are needed. These include: Canes. Walkers. Scooters. Crutches. Turn on the lights when you go into a dark area. Replace any light bulbs as soon as they burn out. Set up your furniture so you have a clear path. Avoid moving your furniture around. If any of your floors are uneven, fix them. If there are any pets around you, be aware of where they are. Review your medicines with your doctor. Some medicines can make you feel dizzy. This can increase your chance of falling. Ask your doctor what other things that you can do to help prevent falls. This information is not intended to replace advice given to you by your health care provider. Make sure you discuss any questions you have with your health care provider. Document Released: 05/13/2009 Document Revised: 12/23/2015 Document Reviewed: 08/21/2014 Elsevier Interactive Patient Education  2017 [employer].

## 2022-07-12 NOTE — Progress Notes (Signed)
Subjective:   Ashley Woods is a 76 y.o. female who presents for Medicare Annual (Subsequent) preventive examination.  I connected with  Ashley Woods on 07/12/22 by a telephone enabled telemedicine application and verified that I am speaking with the correct person using two identifiers.   I discussed the limitations of evaluation and management by telemedicine. The patient expressed understanding and agreed to proceed.  Patient location: home  Provider location: tele-health-office    Review of Systems     Cardiac Risk Factors include: advanced age (>100mn, >>3women);family history of premature cardiovascular disease;obesity (BMI >30kg/m2);hypertension     Objective:    Today's Vitals   There is no height or weight on file to calculate BMI.     07/12/2022   12:29 PM 11/01/2020   11:18 AM 04/24/2018    9:44 AM 09/13/2017    1:41 PM 10/12/2016    1:28 PM  Advanced Directives  Does Patient Have a Medical Advance Directive? Yes Yes Yes Yes No  Type of AParamedicof AReptonLiving will Living will;Healthcare Power of Attorney Living will   Copy of HGoliadin Chart? No - copy requested No - copy requested No - copy requested    Would patient like information on creating a medical advance directive?     No - Patient declined    Current Medications (verified) Outpatient Encounter Medications as of 07/12/2022  Medication Sig   albuterol (PROVENTIL HFA;VENTOLIN HFA) 108 (90 Base) MCG/ACT inhaler Inhale into the lungs.   amLODipine (NORVASC) 5 MG tablet TAKE 1 TABLET(5 MG) BY MOUTH DAILY   benzonatate (TESSALON) 100 MG capsule Take 1 capsule (100 mg total) by mouth 2 (two) times daily as needed for cough.   calcium-vitamin D (OSCAL WITH D) 500-200 MG-UNIT tablet Take by mouth.   citalopram (CELEXA) 40 MG tablet TAKE 1 TABLET(40 MG) BY MOUTH DAILY   clonazePAM (KLONOPIN) 0.5 MG tablet TAKE 1  TABLET(0.5 MG) BY MOUTH AT BEDTIME AS NEEDED   cyanocobalamin (VITAMIN B12) 1000 MCG/ML injection INJECT 1 ML INTO THE MUSCLE EVERY 30 DAYS   furosemide (LASIX) 20 MG tablet Take 2 tablets (40 mg total) by mouth daily.   HYDROcodone-acetaminophen (NORCO/VICODIN) 5-325 MG tablet Take 1 tablet by mouth every 6 (six) hours as needed.   levothyroxine (SYNTHROID) 100 MCG tablet TAKE 1 TABLET(100 MCG) BY MOUTH DAILY   lisinopril (ZESTRIL) 40 MG tablet Take 1 tablet (40 mg total) by mouth daily. TAKE 1 TABLET(30 MG) BY MOUTH DAILY   loperamide (IMODIUM A-D) 2 MG tablet Take 2 mg by mouth.    methylPREDNISolone (MEDROL DOSEPAK) 4 MG TBPK tablet Take as directed   omeprazole (PRILOSEC) 40 MG capsule TAKE 1 CAPSULE(40 MG) BY MOUTH DAILY   oxybutynin (DITROPAN) 5 MG tablet Take 1 tablet (5 mg total) by mouth 3 (three) times daily.   RESTASIS 0.05 % ophthalmic emulsion Place 1 drop into both eyes 2 (two) times daily.    simvastatin (ZOCOR) 40 MG tablet TAKE 1 TABLET(40 MG) BY MOUTH AT BEDTIME   SYRINGE-NEEDLE, DISP, 3 ML (BD SAFETYGLIDE SYRINGE/NEEDLE) 25G X 1" 3 ML MISC Please use one syringe and needle for each injection.   No facility-administered encounter medications on file as of 07/12/2022.    Allergies (verified) Bactrim [sulfamethoxazole-trimethoprim], Naproxen, Trimethoprim, Sulfamethoxazole-trimethoprim, Tramadol, Tramadol hcl, and Zanaflex [tizanidine]   History: Past Medical History:  Diagnosis Date   Anxiety    on meds  Asthma    occassionally   Cataract    bilateral-sx    Colon polyp    adenomatous   COPD (chronic obstructive pulmonary disease) ([place])    Depression    on meds   Diverticulosis    GERD (gastroesophageal reflux disease)    on meds   Hemorrhoids    Hyperlipidemia    on meds   Hypertension    on meds   IBS (irritable bowel syndrome)    Macular degeneration    Dr. Hoyle Sauer   Overactive bladder    Right arm fracture    2018   Rosacea    Sleep apnea     uses cpap   Stroke ([place])    hx of numerous TIA per MRI   Thyroid disease    on meds   Past Surgical History:  Procedure Laterality Date   BLADDER SURGERY  2015   CATARACT EXTRACTION, BILATERAL  2016   CERVICAL FUSION  2000   COLONOSCOPY  2013   TA   HERNIA REPAIR  1610   umbilical   HYSTERECTOMY ABDOMINAL WITH SALPINGO-OOPHORECTOMY  1995   OOPHORECTOMY     PARTIAL HYSTERECTOMY     POLYPECTOMY  2013   TA   SHOULDER SURGERY Right 09/2019   TONSILLECTOMY  1960   Family History  Problem Relation Age of Onset   Dementia Father    Heart disease Daughter    Ashley Woods White syndrome Daughter    Colon cancer Neg Hx    Inflammatory bowel disease Neg Hx    Esophageal cancer Neg Hx    Stomach cancer Neg Hx    Rectal cancer Neg Hx    Colon polyps Neg Hx    Social History   Socioeconomic History   Marital status: Married    Spouse name: Not on file   Number of children: 2   Years of education: Not on file   Highest education level: Not on file  Occupational History   Occupation: currently on disability  Tobacco Use   Smoking status: Former    Packs/day: 1.00    Years: 45.00    Total pack years: 45.00    Types: E-cigarettes, Cigarettes    Quit date: 04/30/2013    Years since quitting: 9.2   Smokeless tobacco: Never   Tobacco comments:    former 1ppd x45 years, 09-13-17 report vaping  Vaping Use   Vaping Use: Some days  Substance and Sexual Activity   Alcohol use: No   Drug use: No   Sexual activity: Not on file  Other Topics Concern   Not on file  Social History Narrative   Husband Ashley Woods on disability.    Former housekeeper   1 year of college   Social Determinants of [place] Strain: [place]  (07/12/2022)   Overall Financial Resource Strain (CARDIA)    Difficulty of Paying Living Expenses: Not hard at all  Food Insecurity: No Food Insecurity (07/12/2022)   Hunger Vital Sign    Worried About Running Out of Food in the Last Year:  Never true    [place] in the Last Year: Never true  Transportation Needs: No Transportation Needs (07/12/2022)   PRAPARE - Hydrologist (Medical): No    Lack of Transportation (Non-Medical): No  Physical Activity: Inactive (07/12/2022)   Exercise Vital Sign    Days of Exercise per Week: 0 days    Minutes of Exercise  per Session: 0 min  Stress: No Stress Concern Present (07/12/2022)   [place]    Feeling of Stress : Not at all  Social Connections: Moderately Isolated (07/12/2022)   Social Connection and Isolation Panel [NHANES]    Frequency of Communication with Friends and Family: More than three times a week    Frequency of Social Gatherings with Friends and Family: Once a week    Attends Religious Services: Never    Marine scientist or Organizations: No    Attends Music therapist: Never    Marital Status: Married    Tobacco Counseling Counseling given: Not Answered Tobacco comments: former 1ppd x45 years, 09-13-17 report vaping   Clinical Intake:  Pre-visit preparation completed: Yes  Pain : No/denies pain     Diabetes: No  How often do you need to have someone help you when you read instructions, pamphlets, or other written materials from your doctor or pharmacy?: 1 - Never  Diabetic? no  Interpreter Needed?: No  Information entered by :: Leroy Kennedy LPN   Activities of Daily Living    07/12/2022   12:38 PM  In your present state of health, do you have any difficulty performing the following activities:  Hearing? 0  Vision? 0  Difficulty concentrating or making decisions? 0  Walking or climbing stairs? 1  Dressing or bathing? 0  Doing errands, shopping? 0  Preparing Food and eating ? N  Using the Toilet? N  In the past six months, have you accidently leaked urine? Y  Do you have problems with loss of bowel control? N  Managing your  Medications? N  Managing your Finances? N  Housekeeping or managing your Housekeeping? N    Patient Care Team: Midge Minium, MD as PCP - Gwenevere Abbot Docia Chuck, MD as Consulting Physician (Gastroenterology) Renelda Loma, OD as Consulting Physician (Optometry) Landry Mellow, Candace (Obstetrics and Gynecology) Franchot Mimes, Teodora Medici, MD as Referring Physician (Pulmonary Disease)  Indicate any recent Medical Services you may have received from other than [facility] providers in the past year (date may be approximate).     Assessment:   This is a routine wellness examination for [facility].  Hearing/Vision screen Hearing Screening - Comments:: Bilateral hearing aids Vision Screening - Comments:: Macular degeneration  Unsure of name knew   Dietary issues and exercise activities discussed: Current Exercise Habits: The patient does not participate in regular exercise at present   Goals Addressed             This Visit's Progress    Weight (lb) < 200 lb (90.7 kg)         Depression Screen    07/12/2022   12:35 PM 10/21/2021   11:03 AM 05/03/2021   11:40 AM 01/07/2021   12:56 PM 11/01/2020   11:19 AM 05/25/2020   12:45 PM 06/06/2019    1:28 PM  PHQ 2/9 Scores  PHQ - 2 Score 0 0 0 0 0 0 0  PHQ- 9 Score 2 0 8 0  0     Fall Risk    07/12/2022   12:30 PM 10/21/2021   11:03 AM 05/03/2021   11:40 AM 01/07/2021   12:55 PM 11/01/2020   11:19 AM  Fall Risk   Falls in the past year? 0 0 1 1 0  Number falls in past yr: 0 0 1 0 0  Injury with Fall? 0 0 1 0 0  Risk  for fall due to :  No Fall Risks  No Fall Risks   Follow up Falls evaluation completed;Education provided;Falls prevention discussed Falls evaluation completed Falls evaluation completed  Falls prevention discussed    FALL RISK PREVENTION PERTAINING TO THE HOME:  Any stairs in or around the home? Yes  If so, are there any without handrails? No  Home free of loose throw rugs in walkways, pet beds, electrical cords, etc?  Yes  Adequate lighting in your home to reduce risk of falls? Yes   ASSISTIVE DEVICES UTILIZED TO PREVENT FALLS:  Life alert? No  Use of a cane, walker or w/c? No  Grab bars in the bathroom? Yes  Shower chair or bench in shower? No  Elevated toilet seat or a handicapped toilet? No   TIMED UP AND GO:  Was the test performed? No .    Cognitive Function:    04/24/2018    9:48 AM 10/12/2016    1:33 PM  MMSE - Mini Mental State Exam  Orientation to time 5 5  Orientation to Place 5 5  Registration 3 3  Attention/ Calculation 5 5  Recall 2 2  Language- name 2 objects 2 2  Language- repeat 1 1  Language- follow 3 step command 3 3  Language- read & follow direction 1 1  Write a sentence 1 1  Copy design 1 1  Total score 29 29        07/12/2022   12:31 PM 11/01/2020   11:25 AM  6CIT Screen  What Year? 0 points 0 points  What month? 0 points 0 points  What time? 0 points 0 points  Count back from 20 0 points 0 points  Months in reverse 2 points 0 points  Repeat phrase 0 points 2 points  Total Score 2 points 2 points    Immunizations Immunization History  Administered Date(s) Administered   Fluad Quad(high Dose 65+) 06/06/2019, 05/25/2020, 05/03/2021, 05/17/2022   Influenza Split 05/08/2012   Influenza Whole 06/18/2008   Influenza, High Dose Seasonal PF 04/24/2018   Influenza,inj,Quad PF,6+ Mos 04/28/2015, 08/22/2016, 10/08/2017   Janssen (J&J) SARS-COV-2 Vaccination 10/06/2019   Pneumococcal Conjugate-13 04/28/2015   Pneumococcal Polysaccharide-23 05/08/2012, 06/06/2019    TDAP status: Due, Education has been provided regarding the importance of this vaccine. Advised may receive this vaccine at local pharmacy or Health Dept. Aware to provide a copy of the vaccination record if obtained from local pharmacy or Health Dept. Verbalized acceptance and understanding.  Flu Vaccine status: Up to date  Pneumococcal vaccine status: Up to date  Covid-19 vaccine status:  Declined, Education has been provided regarding the importance of this vaccine but patient still declined. Advised may receive this vaccine at local pharmacy or Health Dept.or vaccine clinic. Aware to provide a copy of the vaccination record if obtained from local pharmacy or Health Dept. Verbalized acceptance and understanding.  Qualifies for Shingles Vaccine? Yes   Zostavax completed No   Shingrix Completed?: Yes  Screening Tests Health Maintenance  Topic Date Due   DTaP/Tdap/Td (1 - Tdap) Never done   Lung Cancer Screening  Never done   COVID-19 Vaccine (2 - Janssen risk series) 07/28/2022 (Originally 11/03/2019)   Zoster Vaccines- Shingrix (1 of 2) 10/11/2022 (Originally 04/18/1965)   MAMMOGRAM  09/21/2022   Medicare Annual Wellness (AWV)  07/13/2023   COLONOSCOPY (Pts 45-61yr Insurance coverage will need to be confirmed)  04/01/2025   Pneumonia Vaccine 76 Years old  Completed   INFLUENZA VACCINE  Completed   DEXA SCAN  Completed   Hepatitis C Screening  Completed   HPV VACCINES  Aged Out    Health Maintenance  Health Maintenance Due  Topic Date Due   DTaP/Tdap/Td (1 - Tdap) Never done   Lung Cancer Screening  Never done    Colorectal cancer screening: No longer required.   Mammogram status: Completed  . Repeat every year  Bone Density status: Completed 2018. Results reflect: Bone density results: OSTEOPENIA. Repeat every will schedule with mammogram next year years.  Lung Cancer Screening: (Low Dose CT Chest recommended if Age 85-80 years, 30 pack-year currently smoking OR have quit w/in 15years.) does not qualify.   Lung Cancer Screening Referral:   Additional Screening:  Hepatitis C Screening: does not qualify; Completed 20121  Vision Screening: Recommended annual ophthalmology exams for early detection of glaucoma and other disorders of the eye. Is the patient up to date with their annual eye exam?  Yes  Who is the provider or what is the name of the office in  which the patient attends annual eye exams? Knew unsure of name If pt is not established with a provider, would they like to be referred to a provider to establish care? No .   Dental Screening: Recommended annual dental exams for proper oral hygiene  Community Resource Referral / Chronic Care Management: CRR required this visit?  No   CCM required this visit?  No      Plan:     I have personally reviewed and noted the following in the patient's chart:   Medical and social history Use of alcohol, tobacco or illicit drugs  Current medications and supplements including opioid prescriptions. Patient is not currently taking opioid prescriptions. Functional ability and status Nutritional status Physical activity Advanced directives List of other physicians Hospitalizations, surgeries, and ER visits in previous 12 months Vitals Screenings to include cognitive, depression, and falls Referrals and appointments  In addition, I have reviewed and discussed with patient certain preventive protocols, quality metrics, and best practice recommendations. A written personalized care plan for preventive services as well as general preventive health recommendations were provided to patient.     Leroy Kennedy, LPN   [id number]   Nurse Notes:

## 2022-07-20 ENCOUNTER — Other Ambulatory Visit: Payer: Self-pay | Admitting: Family Medicine

## 2022-07-20 DIAGNOSIS — G8929 Other chronic pain: Secondary | ICD-10-CM

## 2022-07-20 MED ORDER — HYDROCODONE-ACETAMINOPHEN 5-325 MG PO TABS: 1.0000 | ORAL_TABLET | Freq: Four times a day (QID) | ORAL | 0 refills | Status: DC | PRN

## 2022-07-20 MED ORDER — CLONAZEPAM 0.5 MG PO TABS: ORAL_TABLET | ORAL | 1 refills | Status: DC

## 2022-08-21 DIAGNOSIS — M5416 Radiculopathy, lumbar region: Secondary | ICD-10-CM | POA: Diagnosis not present

## 2022-08-21 DIAGNOSIS — R1011 Right upper quadrant pain: Secondary | ICD-10-CM | POA: Diagnosis not present

## 2022-08-21 DIAGNOSIS — N3946 Mixed incontinence: Secondary | ICD-10-CM | POA: Diagnosis not present

## 2022-08-21 DIAGNOSIS — R0781 Pleurodynia: Secondary | ICD-10-CM | POA: Diagnosis not present

## 2022-08-21 DIAGNOSIS — G629 Polyneuropathy, unspecified: Secondary | ICD-10-CM | POA: Diagnosis not present

## 2022-08-21 DIAGNOSIS — D472 Monoclonal gammopathy: Secondary | ICD-10-CM | POA: Diagnosis not present

## 2022-08-21 DIAGNOSIS — M48061 Spinal stenosis, lumbar region without neurogenic claudication: Secondary | ICD-10-CM | POA: Diagnosis not present

## 2022-08-21 DIAGNOSIS — R1901 Right upper quadrant abdominal swelling, mass and lump: Secondary | ICD-10-CM | POA: Diagnosis not present

## 2022-08-21 DIAGNOSIS — R5383 Other fatigue: Secondary | ICD-10-CM | POA: Diagnosis not present

## 2022-08-21 DIAGNOSIS — N3941 Urge incontinence: Secondary | ICD-10-CM | POA: Diagnosis not present

## 2022-09-05 ENCOUNTER — Ambulatory Visit (HOSPITAL_COMMUNITY)
Admission: RE | Admit: 2022-09-05 | Payer: PPO | Source: Ambulatory Visit | Attending: Cardiology | Admitting: Cardiology

## 2022-09-06 ENCOUNTER — Encounter: Payer: Self-pay | Admitting: Family Medicine

## 2022-09-06 ENCOUNTER — Ambulatory Visit (INDEPENDENT_AMBULATORY_CARE_PROVIDER_SITE_OTHER): Payer: PPO | Admitting: Family Medicine

## 2022-09-06 VITALS — BP 124/62 | HR 59 | Temp 97.9°F | Resp 18 | Ht 65.0 in | Wt 210.2 lb

## 2022-09-06 DIAGNOSIS — E559 Vitamin D deficiency, unspecified: Secondary | ICD-10-CM

## 2022-09-06 DIAGNOSIS — Z0001 Encounter for general adult medical examination with abnormal findings: Secondary | ICD-10-CM

## 2022-09-06 DIAGNOSIS — E538 Deficiency of other specified B group vitamins: Secondary | ICD-10-CM | POA: Diagnosis not present

## 2022-09-06 DIAGNOSIS — R55 Syncope and collapse: Secondary | ICD-10-CM | POA: Diagnosis not present

## 2022-09-06 DIAGNOSIS — I10 Essential (primary) hypertension: Secondary | ICD-10-CM

## 2022-09-06 DIAGNOSIS — Z0000 Encounter for general adult medical examination without abnormal findings: Secondary | ICD-10-CM

## 2022-09-06 MED ORDER — AMLODIPINE BESYLATE 2.5 MG PO TABS: 2.5000 mg | ORAL_TABLET | Freq: Every day | ORAL | 1 refills | Status: DC

## 2022-09-06 NOTE — Patient Instructions (Signed)
Follow up in 1 month to recheck blood pressure We'll notify you of your lab results and make any changes if needed DECREASE the Amlodipine to 2.'5mg'$  daily Continue to drink LOTS of water Change positions slowly to allow yourself time to adjust If you pass out again or have another fall, please go to the ER for evaluation Call with any questions or concerns Hang in there!!!

## 2022-09-06 NOTE — Progress Notes (Signed)
Subjective:    Patient ID: Ashley Woods, female    DOB: Oct 29, 1945, 77 y.o.   MRN: [id number]  HPI CPE- UTD on mammo, colonoscopy, flu  Patient Care Team    Relationship Specialty Notifications Start End  Midge Minium, MD PCP - General   07/13/10    Comment: Merged (Merged)  Irene Shipper, MD Consulting Physician Gastroenterology  04/28/15   Renelda Loma, [place] Physician Optometry  04/28/15   Rockey Situ  Obstetrics and Gynecology  10/12/16   Franchot Mimes, Teodora Medici, MD Referring Physician Pulmonary Disease  04/24/18     Health Maintenance  Topic Date Due   Lung Cancer Screening  Never done   COVID-19 Vaccine (2 - Janssen risk series) 09/22/2022 (Originally 11/03/2019)   Zoster Vaccines- Shingrix (1 of 2) 10/11/2022 (Originally 04/18/1965)   MAMMOGRAM  09/21/2022   Medicare Annual Wellness (AWV)  07/13/2023   COLONOSCOPY (Pts 45-25yr Insurance coverage will need to be confirmed)  04/01/2025   Pneumonia Vaccine 77 Years old  Completed   INFLUENZA VACCINE  Completed   DEXA SCAN  Completed   Hepatitis C Screening  Completed   HPV VACCINES  Aged Out   DTaP/Tdap/Td  Discontinued      Review of Systems Patient reports no vision/hearing changes, adenopathy,fever, weight change,  persistant/recurrent hoarseness , swallowing issues, chest pain, palpitations, edema, persistant/recurrent cough, hemoptysis, dyspnea (rest/exertional/paroxysmal nocturnal), gastrointestinal bleeding (melena, rectal bleeding), abdominal pain, significant heartburn, bowel changes, Gyn symptoms (abnormal  bleeding, pain), focal weakness, memory loss, numbness & tingling, skin/hair/nail changes, abnormal bruising or bleeding, anxiety, or depression.   + syncopal episode after bending over to untie shoes- has appt upcoming w/ neurology + bladder incontinence- has appt upcoming w/ urology as sxs are worsening    Objective:   Physical Exam General Appearance:    Alert, cooperative, no  distress, appears stated age, obese  Head:    Normocephalic, without obvious abnormality, atraumatic  Eyes:    PERRL, conjunctiva/corneas clear, EOM's intact both eyes  Ears:    Normal TM's and external ear canals, both ears  Nose:   Nares normal, septum midline, mucosa normal, no drainage    or sinus tenderness  Throat:   Lips, mucosa, and tongue normal; teeth and gums normal  Neck:   Supple, symmetrical, trachea midline, no adenopathy;    Thyroid: no enlargement/tenderness/nodules  Back:     Symmetric, no curvature, ROM normal, no CVA tenderness  Lungs:     Clear to auscultation bilaterally, respirations unlabored  Chest Wall:    No tenderness or deformity   Heart:    Regular rate and rhythm, S1 and S2 normal, no murmur, rub   or gallop  Breast Exam:    Deferred to GYN  Abdomen:     Soft, non-tender, bowel sounds active all four quadrants,    no masses, no organomegaly  Genitalia:    Deferred to GYN  Rectal:    Extremities:   Extremities normal, atraumatic, no cyanosis or edema  Pulses:   2+ and symmetric all extremities  Skin:   Skin color, texture, turgor normal, no rashes or lesions  Lymph nodes:   Cervical, supraclavicular, and axillary nodes normal  Neurologic:   CNII-XII intact, normal strength, sensation and reflexes    throughout          Assessment & Plan:   Syncope- new.  Pt reports she passed out while leaning forward to put on her shoes.  Suspect vagal episode.  EKG unchanged from previous.  Will decrease Amlodipine to 2.12m daily and encouraged her to increase her water intake.  She is to change positions slowly to allow time to adjust.  Encouraged her to f/u w/ cards and seek ER evaluation if she has another episode.  Pt expressed understanding and is in agreement w/ plan.

## 2022-09-07 ENCOUNTER — Telehealth: Payer: Self-pay

## 2022-09-07 LAB — HEPATIC FUNCTION PANEL
ALT: 13 U/L (ref 0–35)
AST: 16 U/L (ref 0–37)
Albumin: 4.4 g/dL (ref 3.5–5.2)
Alkaline Phosphatase: 56 U/L (ref 39–117)
Bilirubin, Direct: 0.1 mg/dL (ref 0.0–0.3)
Total Bilirubin: 0.6 mg/dL (ref 0.2–1.2)
Total Protein: 6.4 g/dL (ref 6.0–8.3)

## 2022-09-07 LAB — CBC WITH DIFFERENTIAL/PLATELET
Basophils Absolute: 0.1 10*3/uL (ref 0.0–0.1)
Basophils Relative: 1 % (ref 0.0–3.0)
Eosinophils Absolute: 0.1 10*3/uL (ref 0.0–0.7)
Eosinophils Relative: 2.2 % (ref 0.0–5.0)
HCT: 41.6 % (ref 36.0–46.0)
Hemoglobin: 14.1 g/dL (ref 12.0–15.0)
Lymphocytes Relative: 23.6 % (ref 12.0–46.0)
Lymphs Abs: 1.2 10*3/uL (ref 0.7–4.0)
MCHC: 33.9 g/dL (ref 30.0–36.0)
MCV: 95.6 fl (ref 78.0–100.0)
Monocytes Absolute: 0.5 10*3/uL (ref 0.1–1.0)
Monocytes Relative: 9.4 % (ref 3.0–12.0)
Neutro Abs: 3.3 10*3/uL (ref 1.4–7.7)
Neutrophils Relative %: 63.8 % (ref 43.0–77.0)
Platelets: 144 10*3/uL — ABNORMAL LOW (ref 150.0–400.0)
RBC: 4.35 Mil/uL (ref 3.87–5.11)
RDW: 13.2 % (ref 11.5–15.5)
WBC: 5.1 10*3/uL (ref 4.0–10.5)

## 2022-09-07 LAB — BASIC METABOLIC PANEL
BUN: 19 mg/dL (ref 6–23)
CO2: 28 mEq/L (ref 19–32)
Calcium: 9.7 mg/dL (ref 8.4–10.5)
Chloride: 105 mEq/L (ref 96–112)
Creatinine, Ser: 0.91 mg/dL (ref 0.40–1.20)
GFR: 61.33 mL/min (ref >60.00)
Glucose, Bld: 86 mg/dL (ref 70–99)
Potassium: 4.3 mEq/L (ref 3.5–5.1)
Sodium: 144 mEq/L (ref 135–145)

## 2022-09-07 LAB — LIPID PANEL
Cholesterol: 146 mg/dL (ref 0–200)
HDL: 56.1 mg/dL (ref >39.00)
LDL Cholesterol: 68 mg/dL (ref 0–99)
NonHDL: 89.87
Total CHOL/HDL Ratio: 3
Triglycerides: 111 mg/dL (ref 0.0–149.0)
VLDL: 22.2 mg/dL (ref 0.0–40.0)

## 2022-09-07 LAB — TSH: TSH: 2.57 u[IU]/mL (ref 0.35–5.50)

## 2022-09-07 LAB — VITAMIN D 25 HYDROXY (VIT D DEFICIENCY, FRACTURES): VITD: 33.12 ng/mL (ref 30.00–100.00)

## 2022-09-07 LAB — VITAMIN B12: Vitamin B-12: 442 pg/mL (ref 211–911)

## 2022-09-07 NOTE — Telephone Encounter (Signed)
Left lab results on pt VM 

## 2022-09-07 NOTE — Telephone Encounter (Signed)
-----   Message from Midge Minium, MD sent at 09/07/2022  3:38 PM EST ----- Labs look great!  No changes at this time

## 2022-09-11 ENCOUNTER — Other Ambulatory Visit (HOSPITAL_COMMUNITY): Payer: Self-pay | Admitting: Cardiology

## 2022-09-11 ENCOUNTER — Other Ambulatory Visit: Payer: Self-pay

## 2022-09-11 ENCOUNTER — Telehealth: Payer: Self-pay | Admitting: Family Medicine

## 2022-09-11 DIAGNOSIS — I6523 Occlusion and stenosis of bilateral carotid arteries: Secondary | ICD-10-CM

## 2022-09-11 DIAGNOSIS — E785 Hyperlipidemia, unspecified: Secondary | ICD-10-CM

## 2022-09-11 MED ORDER — SIMVASTATIN 40 MG PO TABS: ORAL_TABLET | ORAL | 1 refills | Status: DC

## 2022-09-11 NOTE — Telephone Encounter (Signed)
Encourage patient to contact the pharmacy for refills or they can request refills through [place]:  [place], [place] - [address] AT [place] 40 MG tablet   NOTES/COMMENTS FROM PATIENT: state she has no refills       Front office please notify patient: It takes 48-72 hours to process rx refill requests Ask patient to call pharmacy to ensure rx is ready before heading there.

## 2022-09-11 NOTE — Telephone Encounter (Signed)
Rx refill has been sent in

## 2022-09-13 DIAGNOSIS — M48062 Spinal stenosis, lumbar region with neurogenic claudication: Secondary | ICD-10-CM | POA: Diagnosis not present

## 2022-09-13 DIAGNOSIS — R419 Unspecified symptoms and signs involving cognitive functions and awareness: Secondary | ICD-10-CM | POA: Diagnosis not present

## 2022-09-13 DIAGNOSIS — G9389 Other specified disorders of brain: Secondary | ICD-10-CM | POA: Diagnosis not present

## 2022-09-13 DIAGNOSIS — Z79899 Other long term (current) drug therapy: Secondary | ICD-10-CM | POA: Diagnosis not present

## 2022-09-13 DIAGNOSIS — G608 Other hereditary and idiopathic neuropathies: Secondary | ICD-10-CM | POA: Diagnosis not present

## 2022-09-14 ENCOUNTER — Ambulatory Visit (HOSPITAL_COMMUNITY)
Admission: RE | Admit: 2022-09-14 | Discharge: 2022-09-14 | Disposition: A | Discharge status: Home / Self Care | Payer: PPO | Source: Ambulatory Visit | Attending: Cardiology | Admitting: Cardiology

## 2022-09-14 DIAGNOSIS — I6523 Occlusion and stenosis of bilateral carotid arteries: Secondary | ICD-10-CM | POA: Diagnosis not present

## 2022-09-15 ENCOUNTER — Encounter: Payer: Self-pay | Admitting: *Deleted

## 2022-09-17 NOTE — Assessment & Plan Note (Signed)
Check labs and replete prn. 

## 2022-09-17 NOTE — Assessment & Plan Note (Signed)
Pt's PE unchanged from previous and WNL w/ exception of BMI.  UTD on mammo, colonoscopy, flu.  Check labs.  Anticipatory guidance provided.

## 2022-09-17 NOTE — Assessment & Plan Note (Signed)
Chronic problem.  Well controlled.  Suspect that she had a vagal episode that caused her to pass out.  Will decrease Amlodipine to 2.47m daily and follow.

## 2022-09-22 DIAGNOSIS — H938X1 Other specified disorders of right ear: Secondary | ICD-10-CM | POA: Diagnosis not present

## 2022-09-22 DIAGNOSIS — H6121 Impacted cerumen, right ear: Secondary | ICD-10-CM | POA: Diagnosis not present

## 2022-09-22 DIAGNOSIS — M26621 Arthralgia of right temporomandibular joint: Secondary | ICD-10-CM | POA: Diagnosis not present

## 2022-10-16 ENCOUNTER — Other Ambulatory Visit: Payer: Self-pay

## 2022-10-16 DIAGNOSIS — E038 Other specified hypothyroidism: Secondary | ICD-10-CM

## 2022-10-16 MED ORDER — LEVOTHYROXINE SODIUM 100 MCG PO TABS: ORAL_TABLET | ORAL | 1 refills | Status: DC

## 2022-10-31 DIAGNOSIS — H10523 Angular blepharoconjunctivitis, bilateral: Secondary | ICD-10-CM | POA: Diagnosis not present

## 2022-10-31 DIAGNOSIS — H26492 Other secondary cataract, left eye: Secondary | ICD-10-CM | POA: Diagnosis not present

## 2022-10-31 DIAGNOSIS — H526 Other disorders of refraction: Secondary | ICD-10-CM | POA: Diagnosis not present

## 2022-10-31 DIAGNOSIS — Z961 Presence of intraocular lens: Secondary | ICD-10-CM | POA: Diagnosis not present

## 2022-10-31 DIAGNOSIS — H353122 Nonexudative age-related macular degeneration, left eye, intermediate dry stage: Secondary | ICD-10-CM | POA: Diagnosis not present

## 2022-10-31 DIAGNOSIS — H353211 Exudative age-related macular degeneration, right eye, with active choroidal neovascularization: Secondary | ICD-10-CM | POA: Diagnosis not present

## 2022-11-06 DIAGNOSIS — G4733 Obstructive sleep apnea (adult) (pediatric): Secondary | ICD-10-CM | POA: Diagnosis not present

## 2022-11-17 ENCOUNTER — Other Ambulatory Visit: Payer: Self-pay | Admitting: Family Medicine

## 2022-11-20 DIAGNOSIS — N3281 Overactive bladder: Secondary | ICD-10-CM | POA: Diagnosis not present

## 2022-11-22 ENCOUNTER — Telehealth: Payer: Self-pay | Admitting: Family Medicine

## 2022-11-22 NOTE — Telephone Encounter (Signed)
Test

## 2022-11-27 DIAGNOSIS — D472 Monoclonal gammopathy: Secondary | ICD-10-CM | POA: Diagnosis not present

## 2022-11-28 ENCOUNTER — Other Ambulatory Visit: Payer: Self-pay | Admitting: Family Medicine

## 2022-11-28 DIAGNOSIS — G8929 Other chronic pain: Secondary | ICD-10-CM

## 2022-11-28 MED ORDER — HYDROCODONE-ACETAMINOPHEN 5-325 MG PO TABS
1.0000 | ORAL_TABLET | Freq: Four times a day (QID) | ORAL | 0 refills | Status: DC | PRN
Start: 2022-11-28 — End: 2023-03-07

## 2022-11-28 NOTE — Telephone Encounter (Signed)
Pt aware rx has been sent in  

## 2022-11-28 NOTE — Telephone Encounter (Signed)
Encourage patient to contact the pharmacy for refills or they can request refills through [facility]   WHAT PHARMACY WOULD THEY LIKE THIS SENT TO:  [facility][id number] Lorenza Evangelist, [place] - [address] AT [facility] [address]  MEDICATION NAME & DOSE: HYDROcodone-acetaminophen (NORCO/VICODIN) 5-325 MG tablet  NOTES/COMMENTS FROM PATIENT:      Front office please notify patient: It takes 48-72 hours to process rx refill requests Ask patient to call pharmacy to ensure rx is ready before heading there.

## 2022-11-28 NOTE — Telephone Encounter (Signed)
Hydrocodone 5-325 mg LOV: 09/06/22 Last Refill:07/20/22 Upcoming appt: none

## 2022-12-01 DIAGNOSIS — H31113 Age-related choroidal atrophy, bilateral: Secondary | ICD-10-CM | POA: Diagnosis not present

## 2022-12-01 DIAGNOSIS — H353231 Exudative age-related macular degeneration, bilateral, with active choroidal neovascularization: Secondary | ICD-10-CM | POA: Diagnosis not present

## 2022-12-01 DIAGNOSIS — H43813 Vitreous degeneration, bilateral: Secondary | ICD-10-CM | POA: Diagnosis not present

## 2022-12-12 ENCOUNTER — Other Ambulatory Visit: Payer: Self-pay

## 2022-12-12 DIAGNOSIS — I10 Essential (primary) hypertension: Secondary | ICD-10-CM

## 2022-12-12 MED ORDER — LISINOPRIL 40 MG PO TABS
40.0000 mg | ORAL_TABLET | Freq: Every day | ORAL | 1 refills | Status: DC
Start: 2022-12-12 — End: 2023-03-12

## 2022-12-22 DIAGNOSIS — M5416 Radiculopathy, lumbar region: Secondary | ICD-10-CM | POA: Diagnosis not present

## 2022-12-22 DIAGNOSIS — M48062 Spinal stenosis, lumbar region with neurogenic claudication: Secondary | ICD-10-CM | POA: Diagnosis not present

## 2022-12-29 DIAGNOSIS — H31113 Age-related choroidal atrophy, bilateral: Secondary | ICD-10-CM | POA: Diagnosis not present

## 2022-12-29 DIAGNOSIS — H43813 Vitreous degeneration, bilateral: Secondary | ICD-10-CM | POA: Diagnosis not present

## 2022-12-29 DIAGNOSIS — H353231 Exudative age-related macular degeneration, bilateral, with active choroidal neovascularization: Secondary | ICD-10-CM | POA: Diagnosis not present

## 2023-01-05 DIAGNOSIS — M48062 Spinal stenosis, lumbar region with neurogenic claudication: Secondary | ICD-10-CM | POA: Diagnosis not present

## 2023-01-05 DIAGNOSIS — M4727 Other spondylosis with radiculopathy, lumbosacral region: Secondary | ICD-10-CM | POA: Diagnosis not present

## 2023-01-05 DIAGNOSIS — M9953 Intervertebral disc stenosis of neural canal of lumbar region: Secondary | ICD-10-CM | POA: Diagnosis not present

## 2023-01-17 DIAGNOSIS — M48062 Spinal stenosis, lumbar region with neurogenic claudication: Secondary | ICD-10-CM | POA: Diagnosis not present

## 2023-01-17 DIAGNOSIS — M5416 Radiculopathy, lumbar region: Secondary | ICD-10-CM | POA: Diagnosis not present

## 2023-01-24 DIAGNOSIS — M5416 Radiculopathy, lumbar region: Secondary | ICD-10-CM | POA: Diagnosis not present

## 2023-01-24 DIAGNOSIS — M48062 Spinal stenosis, lumbar region with neurogenic claudication: Secondary | ICD-10-CM | POA: Diagnosis not present

## 2023-01-29 DIAGNOSIS — M170 Bilateral primary osteoarthritis of knee: Secondary | ICD-10-CM | POA: Diagnosis not present

## 2023-01-29 DIAGNOSIS — M25561 Pain in right knee: Secondary | ICD-10-CM | POA: Diagnosis not present

## 2023-01-29 DIAGNOSIS — M25562 Pain in left knee: Secondary | ICD-10-CM | POA: Diagnosis not present

## 2023-02-05 DIAGNOSIS — M170 Bilateral primary osteoarthritis of knee: Secondary | ICD-10-CM | POA: Diagnosis not present

## 2023-02-05 DIAGNOSIS — M1711 Unilateral primary osteoarthritis, right knee: Secondary | ICD-10-CM | POA: Diagnosis not present

## 2023-02-19 ENCOUNTER — Telehealth: Payer: Self-pay

## 2023-02-19 NOTE — Telephone Encounter (Signed)
Reached out to patient to schedule follow up visit for her hypertension, per [facility]. We scheduled her for 03/07/23. Elijio Miles [facility]

## 2023-02-22 ENCOUNTER — Other Ambulatory Visit: Payer: Self-pay | Admitting: Family Medicine

## 2023-02-22 NOTE — Telephone Encounter (Signed)
Called pt to confirm clonazePAM (KLONOPIN) 0.5 MG tablet

## 2023-02-22 NOTE — Telephone Encounter (Signed)
Patient would like a refill for her sleeping medication Believes the refill may be early but she lost the remainder of them on a trip.

## 2023-02-23 MED ORDER — CLONAZEPAM 0.5 MG PO TABS: ORAL_TABLET | ORAL | 1 refills | Status: DC

## 2023-02-23 NOTE — Telephone Encounter (Signed)
Patient is requesting a refill of the following medications: Requested Prescriptions   Pending Prescriptions Disp Refills   clonazePAM (KLONOPIN) 0.5 MG tablet 30 tablet 1    Sig: TAKE 1 TABLET(0.5 MG) BY MOUTH AT BEDTIME AS NEEDED    Date of patient request: 02/22/23 Last office visit: 09/06/22 Date of last refill: 07/20/22 Last refill amount: 30 Follow up time period per chart: 4 weeks

## 2023-02-23 NOTE — Telephone Encounter (Signed)
Pt called back to confirm that is the correct medication. clonazePAM (KLONOPIN) 0.5 MG tablet

## 2023-02-23 NOTE — Telephone Encounter (Signed)
Lm letting her know this was sent in

## 2023-02-26 DIAGNOSIS — D472 Monoclonal gammopathy: Secondary | ICD-10-CM | POA: Diagnosis not present

## 2023-03-07 ENCOUNTER — Encounter: Payer: Self-pay | Admitting: Family Medicine

## 2023-03-07 ENCOUNTER — Other Ambulatory Visit: Payer: Self-pay | Admitting: Family Medicine

## 2023-03-07 ENCOUNTER — Ambulatory Visit (INDEPENDENT_AMBULATORY_CARE_PROVIDER_SITE_OTHER): Payer: PPO | Admitting: Family Medicine

## 2023-03-07 VITALS — BP 128/80 | HR 68 | Temp 97.9°F | Resp 18 | Ht 65.0 in | Wt 245.0 lb

## 2023-03-07 DIAGNOSIS — G8929 Other chronic pain: Secondary | ICD-10-CM | POA: Diagnosis not present

## 2023-03-07 DIAGNOSIS — M549 Dorsalgia, unspecified: Secondary | ICD-10-CM

## 2023-03-07 DIAGNOSIS — F32A Depression, unspecified: Secondary | ICD-10-CM

## 2023-03-07 DIAGNOSIS — I10 Essential (primary) hypertension: Secondary | ICD-10-CM | POA: Diagnosis not present

## 2023-03-07 DIAGNOSIS — E7800 Pure hypercholesterolemia, unspecified: Secondary | ICD-10-CM

## 2023-03-07 DIAGNOSIS — L719 Rosacea, unspecified: Secondary | ICD-10-CM

## 2023-03-07 DIAGNOSIS — Z6841 Body Mass Index (BMI) 40.0 and over, adult: Secondary | ICD-10-CM | POA: Diagnosis not present

## 2023-03-07 LAB — BASIC METABOLIC PANEL
BUN: 15 mg/dL (ref 6–23)
CO2: 29 mEq/L (ref 19–32)
Calcium: 9.7 mg/dL (ref 8.4–10.5)
Chloride: 104 mEq/L (ref 96–112)
Creatinine, Ser: 0.96 mg/dL (ref 0.40–1.20)
GFR: 57.32 mL/min — ABNORMAL LOW (ref >60.00)
Glucose, Bld: 123 mg/dL — ABNORMAL HIGH (ref 70–99)
Potassium: 4.5 mEq/L (ref 3.5–5.1)
Sodium: 141 mEq/L (ref 135–145)

## 2023-03-07 LAB — CBC WITH DIFFERENTIAL/PLATELET
Basophils Absolute: 0 10*3/uL (ref 0.0–0.1)
Basophils Relative: 0.8 % (ref 0.0–3.0)
Eosinophils Absolute: 0.1 10*3/uL (ref 0.0–0.7)
Eosinophils Relative: 2.5 % (ref 0.0–5.0)
HCT: 44.4 % (ref 36.0–46.0)
Hemoglobin: 14.6 g/dL (ref 12.0–15.0)
Lymphocytes Relative: 26.6 % (ref 12.0–46.0)
Lymphs Abs: 1.5 10*3/uL (ref 0.7–4.0)
MCHC: 32.8 g/dL (ref 30.0–36.0)
MCV: 96.6 fl (ref 78.0–100.0)
Monocytes Absolute: 0.5 10*3/uL (ref 0.1–1.0)
Monocytes Relative: 8.7 % (ref 3.0–12.0)
Neutro Abs: 3.4 10*3/uL (ref 1.4–7.7)
Neutrophils Relative %: 61.4 % (ref 43.0–77.0)
Platelets: 154 10*3/uL (ref 150.0–400.0)
RBC: 4.6 Mil/uL (ref 3.87–5.11)
RDW: 13.7 % (ref 11.5–15.5)
WBC: 5.5 10*3/uL (ref 4.0–10.5)

## 2023-03-07 LAB — LIPID PANEL
Cholesterol: 198 mg/dL (ref 0–200)
HDL: 58.7 mg/dL
NonHDL: 139.65
Total CHOL/HDL Ratio: 3
Triglycerides: 271 mg/dL — ABNORMAL HIGH (ref 0.0–149.0)
VLDL: 54.2 mg/dL — ABNORMAL HIGH (ref 0.0–40.0)

## 2023-03-07 LAB — HEPATIC FUNCTION PANEL
ALT: 15 U/L (ref 0–35)
AST: 17 U/L (ref 0–37)
Albumin: 4.4 g/dL (ref 3.5–5.2)
Alkaline Phosphatase: 60 U/L (ref 39–117)
Bilirubin, Direct: 0.1 mg/dL (ref 0.0–0.3)
Total Bilirubin: 0.6 mg/dL (ref 0.2–1.2)
Total Protein: 6.7 g/dL (ref 6.0–8.3)

## 2023-03-07 LAB — TSH: TSH: 1.69 u[IU]/mL (ref 0.35–5.50)

## 2023-03-07 LAB — LDL CHOLESTEROL, DIRECT: Direct LDL: 100 mg/dL

## 2023-03-07 MED ORDER — HYDROCODONE-ACETAMINOPHEN 5-325 MG PO TABS
1.0000 | ORAL_TABLET | Freq: Four times a day (QID) | ORAL | 0 refills | Status: DC | PRN
Start: 2023-03-07 — End: 2023-11-01

## 2023-03-07 MED ORDER — METRONIDAZOLE 1 % EX GEL: Freq: Every day | CUTANEOUS | 3 refills | Status: AC

## 2023-03-07 MED ORDER — BUPROPION HCL 75 MG PO TABS: 75.0000 mg | ORAL_TABLET | Freq: Two times a day (BID) | ORAL | 3 refills | Status: DC

## 2023-03-07 NOTE — Patient Instructions (Signed)
Follow up in 6 weeks to recheck mood We'll notify you of your lab results and make any changes if needed ADD the Bupropion twice daily to help w/ mood Try and make healthy food choices and move as you are able Call with any questions or concerns Stay Safe!  Stay Healthy! Hang in there!!!

## 2023-03-07 NOTE — Assessment & Plan Note (Signed)
Chronic problem.  Adequate control on Amlodipine 2.5mg  daily, Lisinopril 40mg  daily, and Lasix 40mg  daily.  Currently asymptomatic.  Check labs due to ACE and diuretic use but no anticipated med changes.  Will follow.

## 2023-03-07 NOTE — Progress Notes (Signed)
   Subjective:    Patient ID: Ashley Woods, female    DOB: [birth year], 77 y.o.   MRN: [id number]  HPI HTN- chronic problem, on Amlodipine 2.5mg  daily, Lisinopril 40mg  daily, Lasix 20mg  daily w/ adequate control.  Denies CP, SOB, HA's, visual changes  Hyperlipidemia- chronic problem, on Simvastatin 40mg  daily.  Denies abd pain, N/V.  Obesity- pt has gained 35 lbs since Feb.  BMI now 40.77  Pt is having upcoming knee surgery and is very limited in physical activity  Depression- pt reports feeling down, more irritable.  Having trouble w/ her OSA and bipap machine.  Currently on Citalopram.  Rosacea- pt has not had sxs recently but needs refill on medication.  Review of Systems For ROS see HPI     Objective:   Physical Exam Vitals reviewed.  Constitutional:      General: She is not in acute distress.    Appearance: Normal appearance. She is well-developed. She is obese. She is not ill-appearing.  HENT:     Head: Normocephalic and atraumatic.  Eyes:     Conjunctiva/sclera: Conjunctivae normal.     Pupils: Pupils are equal, round, and reactive to light.  Neck:     Thyroid: No thyromegaly.  Cardiovascular:     Rate and Rhythm: Normal rate and regular rhythm.     Pulses: Normal pulses.     Heart sounds: Normal heart sounds. No murmur heard. Pulmonary:     Effort: Pulmonary effort is normal. No respiratory distress.     Breath sounds: Normal breath sounds.  Abdominal:     General: There is no distension.     Palpations: Abdomen is soft.     Tenderness: There is no abdominal tenderness.  Musculoskeletal:     Cervical back: Normal range of motion and neck supple.     Right lower leg: Edema (trace) present.     Left lower leg: Edema (trace) present.  Lymphadenopathy:     Cervical: No cervical adenopathy.  Skin:    General: Skin is warm and dry.  Neurological:     General: No focal deficit present.     Mental Status: She is alert and oriented to person, place, and time.   Psychiatric:        Behavior: Behavior normal.           Assessment & Plan:

## 2023-03-07 NOTE — Assessment & Plan Note (Signed)
Deteriorated.  Pt has gained 35 lbs since Feb.  Has been very sedentary due to back and knee pain.  Has been stress eating.  Encouraged low carb diet and physical activity as able.  Will follow.

## 2023-03-07 NOTE — Assessment & Plan Note (Signed)
Deteriorated.  Needs to restart Metrogel.  Prescription sent.

## 2023-03-07 NOTE — Assessment & Plan Note (Signed)
Deteriorated.  Pt has been feeling down, more irritable.  Frustrated w/ husband.  Has upcoming knee surgery and is in constant pain right now.  Already on Citalopram 40mg  daily.  Will start Wellbutrin 75mg  BID and monitor for improvement.  Pt expressed understanding and is in agreement w/ plan.

## 2023-03-07 NOTE — Assessment & Plan Note (Signed)
Chronic problem.  Currently on Simvastatin '40mg'$  daily w/o difficulty.  Check labs.  Adjust meds prn

## 2023-03-08 ENCOUNTER — Other Ambulatory Visit: Payer: Self-pay

## 2023-03-08 ENCOUNTER — Telehealth: Payer: Self-pay

## 2023-03-08 ENCOUNTER — Ambulatory Visit (INDEPENDENT_AMBULATORY_CARE_PROVIDER_SITE_OTHER): Payer: PPO

## 2023-03-08 DIAGNOSIS — R7309 Other abnormal glucose: Secondary | ICD-10-CM

## 2023-03-08 LAB — HEMOGLOBIN A1C: Hgb A1c MFr Bld: 5.7 % (ref 4.6–6.5)

## 2023-03-08 NOTE — Telephone Encounter (Signed)
PT IS AWARE OF LAB RESULTS . The Add on A1C has been faxed to lab

## 2023-03-08 NOTE — Telephone Encounter (Signed)
-----   Message from Neena Rhymes sent at 03/08/2023  7:35 AM EDT ----- Labs look good w/ exception of elevated sugar.  Based on this, we're going to add an A1C to assess for possible diabetes.  Remainder of labs are stable and look good

## 2023-03-09 ENCOUNTER — Telehealth: Payer: Self-pay

## 2023-03-09 NOTE — Telephone Encounter (Signed)
-----   Message from Neena Rhymes sent at 03/08/2023  1:39 PM EDT ----- No evidence of diabetes- great news!

## 2023-03-11 ENCOUNTER — Other Ambulatory Visit: Payer: Self-pay | Admitting: Cardiology

## 2023-03-11 DIAGNOSIS — I10 Essential (primary) hypertension: Secondary | ICD-10-CM

## 2023-03-12 ENCOUNTER — Other Ambulatory Visit: Payer: Self-pay | Admitting: Family Medicine

## 2023-03-12 DIAGNOSIS — I10 Essential (primary) hypertension: Secondary | ICD-10-CM

## 2023-03-15 ENCOUNTER — Encounter (INDEPENDENT_AMBULATORY_CARE_PROVIDER_SITE_OTHER): Payer: Self-pay

## 2023-03-19 DIAGNOSIS — H43813 Vitreous degeneration, bilateral: Secondary | ICD-10-CM | POA: Diagnosis not present

## 2023-03-19 DIAGNOSIS — H31113 Age-related choroidal atrophy, bilateral: Secondary | ICD-10-CM | POA: Diagnosis not present

## 2023-03-19 DIAGNOSIS — H353231 Exudative age-related macular degeneration, bilateral, with active choroidal neovascularization: Secondary | ICD-10-CM | POA: Diagnosis not present

## 2023-04-03 DIAGNOSIS — M25551 Pain in right hip: Secondary | ICD-10-CM | POA: Diagnosis not present

## 2023-04-03 DIAGNOSIS — S7001XA Contusion of right hip, initial encounter: Secondary | ICD-10-CM | POA: Diagnosis not present

## 2023-04-03 DIAGNOSIS — M25512 Pain in left shoulder: Secondary | ICD-10-CM | POA: Diagnosis not present

## 2023-04-03 DIAGNOSIS — S40012A Contusion of left shoulder, initial encounter: Secondary | ICD-10-CM | POA: Diagnosis not present

## 2023-04-06 ENCOUNTER — Encounter: Payer: PPO | Admitting: Family Medicine

## 2023-04-06 ENCOUNTER — Other Ambulatory Visit: Payer: Self-pay | Admitting: Family Medicine

## 2023-04-06 DIAGNOSIS — E785 Hyperlipidemia, unspecified: Secondary | ICD-10-CM

## 2023-04-13 DIAGNOSIS — M1711 Unilateral primary osteoarthritis, right knee: Secondary | ICD-10-CM | POA: Insufficient documentation

## 2023-04-13 DIAGNOSIS — D4702 Systemic mastocytosis: Secondary | ICD-10-CM | POA: Diagnosis not present

## 2023-04-13 DIAGNOSIS — E039 Hypothyroidism, unspecified: Secondary | ICD-10-CM | POA: Diagnosis not present

## 2023-04-13 DIAGNOSIS — G629 Polyneuropathy, unspecified: Secondary | ICD-10-CM | POA: Diagnosis not present

## 2023-04-13 DIAGNOSIS — R4189 Other symptoms and signs involving cognitive functions and awareness: Secondary | ICD-10-CM | POA: Diagnosis not present

## 2023-04-13 DIAGNOSIS — D472 Monoclonal gammopathy: Secondary | ICD-10-CM | POA: Insufficient documentation

## 2023-04-13 DIAGNOSIS — I10 Essential (primary) hypertension: Secondary | ICD-10-CM | POA: Diagnosis not present

## 2023-04-13 DIAGNOSIS — I6523 Occlusion and stenosis of bilateral carotid arteries: Secondary | ICD-10-CM | POA: Insufficient documentation

## 2023-04-13 DIAGNOSIS — I350 Nonrheumatic aortic (valve) stenosis: Secondary | ICD-10-CM | POA: Insufficient documentation

## 2023-04-13 DIAGNOSIS — Z6841 Body Mass Index (BMI) 40.0 and over, adult: Secondary | ICD-10-CM | POA: Diagnosis not present

## 2023-04-13 DIAGNOSIS — F32A Depression, unspecified: Secondary | ICD-10-CM | POA: Diagnosis not present

## 2023-04-13 DIAGNOSIS — J45909 Unspecified asthma, uncomplicated: Secondary | ICD-10-CM | POA: Diagnosis not present

## 2023-04-13 DIAGNOSIS — K219 Gastro-esophageal reflux disease without esophagitis: Secondary | ICD-10-CM | POA: Diagnosis not present

## 2023-04-13 DIAGNOSIS — R54 Age-related physical debility: Secondary | ICD-10-CM | POA: Diagnosis not present

## 2023-04-13 DIAGNOSIS — N3946 Mixed incontinence: Secondary | ICD-10-CM | POA: Diagnosis not present

## 2023-04-13 DIAGNOSIS — G4700 Insomnia, unspecified: Secondary | ICD-10-CM | POA: Diagnosis not present

## 2023-04-13 DIAGNOSIS — R600 Localized edema: Secondary | ICD-10-CM | POA: Diagnosis not present

## 2023-04-13 DIAGNOSIS — G4733 Obstructive sleep apnea (adult) (pediatric): Secondary | ICD-10-CM | POA: Diagnosis not present

## 2023-04-13 DIAGNOSIS — E785 Hyperlipidemia, unspecified: Secondary | ICD-10-CM | POA: Diagnosis not present

## 2023-04-13 DIAGNOSIS — Z720 Tobacco use: Secondary | ICD-10-CM | POA: Diagnosis not present

## 2023-04-13 DIAGNOSIS — Z01818 Encounter for other preprocedural examination: Secondary | ICD-10-CM | POA: Diagnosis not present

## 2023-04-30 DIAGNOSIS — Z96651 Presence of right artificial knee joint: Secondary | ICD-10-CM | POA: Diagnosis not present

## 2023-04-30 DIAGNOSIS — D472 Monoclonal gammopathy: Secondary | ICD-10-CM | POA: Diagnosis not present

## 2023-04-30 DIAGNOSIS — M1711 Unilateral primary osteoarthritis, right knee: Secondary | ICD-10-CM | POA: Diagnosis not present

## 2023-05-04 DIAGNOSIS — Z882 Allergy status to sulfonamides status: Secondary | ICD-10-CM | POA: Diagnosis not present

## 2023-05-04 DIAGNOSIS — Z96651 Presence of right artificial knee joint: Secondary | ICD-10-CM | POA: Diagnosis not present

## 2023-05-04 DIAGNOSIS — F32A Depression, unspecified: Secondary | ICD-10-CM | POA: Diagnosis not present

## 2023-05-04 DIAGNOSIS — E785 Hyperlipidemia, unspecified: Secondary | ICD-10-CM | POA: Diagnosis not present

## 2023-05-04 DIAGNOSIS — G43909 Migraine, unspecified, not intractable, without status migrainosus: Secondary | ICD-10-CM | POA: Diagnosis not present

## 2023-05-04 DIAGNOSIS — G4733 Obstructive sleep apnea (adult) (pediatric): Secondary | ICD-10-CM | POA: Diagnosis not present

## 2023-05-04 DIAGNOSIS — E039 Hypothyroidism, unspecified: Secondary | ICD-10-CM | POA: Diagnosis not present

## 2023-05-04 DIAGNOSIS — G8918 Other acute postprocedural pain: Secondary | ICD-10-CM | POA: Diagnosis not present

## 2023-05-04 DIAGNOSIS — I10 Essential (primary) hypertension: Secondary | ICD-10-CM | POA: Diagnosis not present

## 2023-05-04 DIAGNOSIS — Z471 Aftercare following joint replacement surgery: Secondary | ICD-10-CM | POA: Diagnosis not present

## 2023-05-04 DIAGNOSIS — Z7982 Long term (current) use of aspirin: Secondary | ICD-10-CM | POA: Diagnosis not present

## 2023-05-04 DIAGNOSIS — M1711 Unilateral primary osteoarthritis, right knee: Secondary | ICD-10-CM | POA: Diagnosis not present

## 2023-05-04 DIAGNOSIS — Z79890 Hormone replacement therapy (postmenopausal): Secondary | ICD-10-CM | POA: Diagnosis not present

## 2023-05-04 DIAGNOSIS — J45909 Unspecified asthma, uncomplicated: Secondary | ICD-10-CM | POA: Diagnosis not present

## 2023-05-04 DIAGNOSIS — Z79899 Other long term (current) drug therapy: Secondary | ICD-10-CM | POA: Diagnosis not present

## 2023-05-04 DIAGNOSIS — K219 Gastro-esophageal reflux disease without esophagitis: Secondary | ICD-10-CM | POA: Diagnosis not present

## 2023-05-04 DIAGNOSIS — F17290 Nicotine dependence, other tobacco product, uncomplicated: Secondary | ICD-10-CM | POA: Diagnosis not present

## 2023-05-04 DIAGNOSIS — Z885 Allergy status to narcotic agent status: Secondary | ICD-10-CM | POA: Diagnosis not present

## 2023-05-04 DIAGNOSIS — Z6841 Body Mass Index (BMI) 40.0 and over, adult: Secondary | ICD-10-CM | POA: Diagnosis not present

## 2023-05-04 DIAGNOSIS — Z888 Allergy status to other drugs, medicaments and biological substances status: Secondary | ICD-10-CM | POA: Diagnosis not present

## 2023-05-08 ENCOUNTER — Other Ambulatory Visit: Payer: Self-pay | Admitting: Family Medicine

## 2023-05-08 DIAGNOSIS — G4733 Obstructive sleep apnea (adult) (pediatric): Secondary | ICD-10-CM | POA: Diagnosis not present

## 2023-05-09 DIAGNOSIS — E039 Hypothyroidism, unspecified: Secondary | ICD-10-CM | POA: Diagnosis not present

## 2023-05-09 DIAGNOSIS — R6883 Chills (without fever): Secondary | ICD-10-CM | POA: Diagnosis not present

## 2023-05-09 DIAGNOSIS — K219 Gastro-esophageal reflux disease without esophagitis: Secondary | ICD-10-CM | POA: Diagnosis not present

## 2023-05-09 DIAGNOSIS — Z471 Aftercare following joint replacement surgery: Secondary | ICD-10-CM | POA: Diagnosis not present

## 2023-05-09 DIAGNOSIS — M1711 Unilateral primary osteoarthritis, right knee: Secondary | ICD-10-CM | POA: Diagnosis not present

## 2023-05-09 DIAGNOSIS — M25561 Pain in right knee: Secondary | ICD-10-CM | POA: Diagnosis not present

## 2023-05-09 DIAGNOSIS — R350 Frequency of micturition: Secondary | ICD-10-CM | POA: Diagnosis not present

## 2023-05-09 DIAGNOSIS — E785 Hyperlipidemia, unspecified: Secondary | ICD-10-CM | POA: Diagnosis not present

## 2023-05-09 DIAGNOSIS — F419 Anxiety disorder, unspecified: Secondary | ICD-10-CM | POA: Diagnosis not present

## 2023-05-09 DIAGNOSIS — N3281 Overactive bladder: Secondary | ICD-10-CM | POA: Diagnosis not present

## 2023-05-09 DIAGNOSIS — F32A Depression, unspecified: Secondary | ICD-10-CM | POA: Diagnosis not present

## 2023-05-09 DIAGNOSIS — N390 Urinary tract infection, site not specified: Secondary | ICD-10-CM | POA: Diagnosis not present

## 2023-05-09 DIAGNOSIS — H04129 Dry eye syndrome of unspecified lacrimal gland: Secondary | ICD-10-CM | POA: Diagnosis not present

## 2023-05-09 DIAGNOSIS — R195 Other fecal abnormalities: Secondary | ICD-10-CM | POA: Diagnosis not present

## 2023-05-09 DIAGNOSIS — R5381 Other malaise: Secondary | ICD-10-CM | POA: Diagnosis not present

## 2023-05-09 DIAGNOSIS — R52 Pain, unspecified: Secondary | ICD-10-CM | POA: Diagnosis not present

## 2023-05-09 DIAGNOSIS — Z4789 Encounter for other orthopedic aftercare: Secondary | ICD-10-CM | POA: Diagnosis not present

## 2023-05-09 DIAGNOSIS — J45909 Unspecified asthma, uncomplicated: Secondary | ICD-10-CM | POA: Diagnosis not present

## 2023-05-09 DIAGNOSIS — I10 Essential (primary) hypertension: Secondary | ICD-10-CM | POA: Diagnosis not present

## 2023-05-09 DIAGNOSIS — F5101 Primary insomnia: Secondary | ICD-10-CM | POA: Diagnosis not present

## 2023-05-09 DIAGNOSIS — R296 Repeated falls: Secondary | ICD-10-CM | POA: Diagnosis not present

## 2023-05-10 DIAGNOSIS — I10 Essential (primary) hypertension: Secondary | ICD-10-CM | POA: Diagnosis not present

## 2023-05-10 DIAGNOSIS — E039 Hypothyroidism, unspecified: Secondary | ICD-10-CM | POA: Diagnosis not present

## 2023-05-10 DIAGNOSIS — N3281 Overactive bladder: Secondary | ICD-10-CM | POA: Diagnosis not present

## 2023-05-10 DIAGNOSIS — E785 Hyperlipidemia, unspecified: Secondary | ICD-10-CM | POA: Diagnosis not present

## 2023-05-10 DIAGNOSIS — R52 Pain, unspecified: Secondary | ICD-10-CM | POA: Diagnosis not present

## 2023-05-10 DIAGNOSIS — H04129 Dry eye syndrome of unspecified lacrimal gland: Secondary | ICD-10-CM | POA: Diagnosis not present

## 2023-05-10 DIAGNOSIS — J45909 Unspecified asthma, uncomplicated: Secondary | ICD-10-CM | POA: Diagnosis not present

## 2023-05-10 DIAGNOSIS — K219 Gastro-esophageal reflux disease without esophagitis: Secondary | ICD-10-CM | POA: Diagnosis not present

## 2023-05-10 DIAGNOSIS — R5381 Other malaise: Secondary | ICD-10-CM | POA: Diagnosis not present

## 2023-05-10 DIAGNOSIS — M1711 Unilateral primary osteoarthritis, right knee: Secondary | ICD-10-CM | POA: Diagnosis not present

## 2023-05-11 DIAGNOSIS — R195 Other fecal abnormalities: Secondary | ICD-10-CM | POA: Diagnosis not present

## 2023-05-11 DIAGNOSIS — R6883 Chills (without fever): Secondary | ICD-10-CM | POA: Diagnosis not present

## 2023-05-15 DIAGNOSIS — E039 Hypothyroidism, unspecified: Secondary | ICD-10-CM | POA: Diagnosis not present

## 2023-05-15 DIAGNOSIS — R5381 Other malaise: Secondary | ICD-10-CM | POA: Diagnosis not present

## 2023-05-15 DIAGNOSIS — I10 Essential (primary) hypertension: Secondary | ICD-10-CM | POA: Diagnosis not present

## 2023-05-15 DIAGNOSIS — H04129 Dry eye syndrome of unspecified lacrimal gland: Secondary | ICD-10-CM | POA: Diagnosis not present

## 2023-05-15 DIAGNOSIS — E785 Hyperlipidemia, unspecified: Secondary | ICD-10-CM | POA: Diagnosis not present

## 2023-05-16 DIAGNOSIS — F5101 Primary insomnia: Secondary | ICD-10-CM | POA: Diagnosis not present

## 2023-05-16 DIAGNOSIS — F32A Depression, unspecified: Secondary | ICD-10-CM | POA: Diagnosis not present

## 2023-05-16 DIAGNOSIS — I10 Essential (primary) hypertension: Secondary | ICD-10-CM | POA: Diagnosis not present

## 2023-05-18 DIAGNOSIS — E039 Hypothyroidism, unspecified: Secondary | ICD-10-CM | POA: Diagnosis not present

## 2023-05-18 DIAGNOSIS — F419 Anxiety disorder, unspecified: Secondary | ICD-10-CM | POA: Diagnosis not present

## 2023-05-18 DIAGNOSIS — I10 Essential (primary) hypertension: Secondary | ICD-10-CM | POA: Diagnosis not present

## 2023-05-18 DIAGNOSIS — M25561 Pain in right knee: Secondary | ICD-10-CM | POA: Diagnosis not present

## 2023-05-18 DIAGNOSIS — N3281 Overactive bladder: Secondary | ICD-10-CM | POA: Diagnosis not present

## 2023-05-18 DIAGNOSIS — M1711 Unilateral primary osteoarthritis, right knee: Secondary | ICD-10-CM | POA: Diagnosis not present

## 2023-05-18 DIAGNOSIS — J45909 Unspecified asthma, uncomplicated: Secondary | ICD-10-CM | POA: Diagnosis not present

## 2023-05-18 DIAGNOSIS — E785 Hyperlipidemia, unspecified: Secondary | ICD-10-CM | POA: Diagnosis not present

## 2023-05-18 DIAGNOSIS — R5381 Other malaise: Secondary | ICD-10-CM | POA: Diagnosis not present

## 2023-05-18 DIAGNOSIS — F32A Depression, unspecified: Secondary | ICD-10-CM | POA: Diagnosis not present

## 2023-05-21 DIAGNOSIS — N390 Urinary tract infection, site not specified: Secondary | ICD-10-CM | POA: Diagnosis not present

## 2023-05-22 DIAGNOSIS — J45909 Unspecified asthma, uncomplicated: Secondary | ICD-10-CM | POA: Diagnosis not present

## 2023-05-22 DIAGNOSIS — M25561 Pain in right knee: Secondary | ICD-10-CM | POA: Diagnosis not present

## 2023-05-22 DIAGNOSIS — M1711 Unilateral primary osteoarthritis, right knee: Secondary | ICD-10-CM | POA: Diagnosis not present

## 2023-05-22 DIAGNOSIS — N3281 Overactive bladder: Secondary | ICD-10-CM | POA: Diagnosis not present

## 2023-05-22 DIAGNOSIS — R350 Frequency of micturition: Secondary | ICD-10-CM | POA: Diagnosis not present

## 2023-05-22 DIAGNOSIS — E039 Hypothyroidism, unspecified: Secondary | ICD-10-CM | POA: Diagnosis not present

## 2023-05-22 DIAGNOSIS — R5381 Other malaise: Secondary | ICD-10-CM | POA: Diagnosis not present

## 2023-05-22 DIAGNOSIS — I10 Essential (primary) hypertension: Secondary | ICD-10-CM | POA: Diagnosis not present

## 2023-05-24 DIAGNOSIS — E785 Hyperlipidemia, unspecified: Secondary | ICD-10-CM | POA: Diagnosis not present

## 2023-05-24 DIAGNOSIS — F419 Anxiety disorder, unspecified: Secondary | ICD-10-CM | POA: Diagnosis not present

## 2023-05-24 DIAGNOSIS — Z7951 Long term (current) use of inhaled steroids: Secondary | ICD-10-CM | POA: Diagnosis not present

## 2023-05-24 DIAGNOSIS — F32A Depression, unspecified: Secondary | ICD-10-CM | POA: Diagnosis not present

## 2023-05-24 DIAGNOSIS — F17200 Nicotine dependence, unspecified, uncomplicated: Secondary | ICD-10-CM | POA: Diagnosis not present

## 2023-05-24 DIAGNOSIS — Z471 Aftercare following joint replacement surgery: Secondary | ICD-10-CM | POA: Diagnosis not present

## 2023-05-24 DIAGNOSIS — Z96651 Presence of right artificial knee joint: Secondary | ICD-10-CM | POA: Diagnosis not present

## 2023-05-24 DIAGNOSIS — I10 Essential (primary) hypertension: Secondary | ICD-10-CM | POA: Diagnosis not present

## 2023-05-24 DIAGNOSIS — E039 Hypothyroidism, unspecified: Secondary | ICD-10-CM | POA: Diagnosis not present

## 2023-05-24 DIAGNOSIS — J45909 Unspecified asthma, uncomplicated: Secondary | ICD-10-CM | POA: Diagnosis not present

## 2023-05-25 ENCOUNTER — Telehealth: Payer: Self-pay | Admitting: Family Medicine

## 2023-05-25 NOTE — Telephone Encounter (Signed)
Pt giving FYI in case we receive fax regarding surgery info:  She has a total rt knee replacement October 4th Went to rehab for 2 weeks She has a F/U appt with surgeon, Dr. Orson Aloe on Oct 30th

## 2023-05-25 NOTE — Telephone Encounter (Signed)
Orders were given. Advised to call surgeon for future orders.

## 2023-05-25 NOTE — Telephone Encounter (Signed)
Okay for Verbal Order?

## 2023-05-25 NOTE — Telephone Encounter (Signed)
We can provide the order but this is often best ordered by the surgeon who did the knee replacement

## 2023-05-25 NOTE — Telephone Encounter (Signed)
FYI from pt about surgical status

## 2023-05-25 NOTE — Telephone Encounter (Signed)
Home Health Verbal Orders  Agency: [facility]   Caller: Luisa Hart  Call back #: [phone] Fax #:    Requesting RN:    Reason for Request:    RN 1wk2 for Education   PT /OT: Shanda Howells

## 2023-05-25 NOTE — Telephone Encounter (Signed)
Left vm to call office about orders

## 2023-05-30 DIAGNOSIS — M25561 Pain in right knee: Secondary | ICD-10-CM | POA: Diagnosis not present

## 2023-06-11 ENCOUNTER — Encounter: Payer: Self-pay | Admitting: Pharmacist

## 2023-06-11 ENCOUNTER — Other Ambulatory Visit: Payer: Self-pay | Admitting: Pharmacist

## 2023-06-11 DIAGNOSIS — E038 Other specified hypothyroidism: Secondary | ICD-10-CM

## 2023-06-11 DIAGNOSIS — E785 Hyperlipidemia, unspecified: Secondary | ICD-10-CM

## 2023-06-11 MED ORDER — CITALOPRAM HYDROBROMIDE 40 MG PO TABS: ORAL_TABLET | ORAL | 1 refills | Status: DC

## 2023-06-11 MED ORDER — LEVOTHYROXINE SODIUM 100 MCG PO TABS
ORAL_TABLET | ORAL | 1 refills | Status: DC
Start: 2023-06-11 — End: 2023-12-03

## 2023-06-11 MED ORDER — CYANOCOBALAMIN 1000 MCG/ML IJ SOLN: INTRAMUSCULAR | 6 refills | Status: DC

## 2023-06-11 MED ORDER — SIMVASTATIN 40 MG PO TABS
ORAL_TABLET | ORAL | 1 refills | Status: AC
Start: 2023-06-11 — End: ?

## 2023-06-11 NOTE — Progress Notes (Signed)
Pharmacy Quality Measure Review  This patient is appearing on a report for being at risk of failing the adherence measure for cholesterol (statin) medications this calendar year.   Medication: simvastatin 40mg  Last fill date: 05/09/2023 for 30 day supply  Patient had knee replacement May 04, 2023. After she was discharged to rehab for 2 week and is not at home.  She reports that she has enough simvastatin right now but does need an updated Prescription for her next refill.  She also requests refills for the following medication which need updated prescriptions - levothyroxine, citalopram and B12  injection.   Discussed adherence.  Reviewed medication indication, dosing, and goals of therapy.  Will collaborate with provider to facilitate refill needs.  Henrene Pastor, PharmD Clinical Pharmacist [facility]  Population Health [phone]

## 2023-06-11 NOTE — Telephone Encounter (Signed)
Patient needs refills for the following medications - levothyroxine, B12, simvastatin and citalopram.  She reports she does not need simvastatin filled yet but she does not have active Rx at [facility]

## 2023-06-25 DIAGNOSIS — M25561 Pain in right knee: Secondary | ICD-10-CM | POA: Diagnosis not present

## 2023-06-25 DIAGNOSIS — M1711 Unilateral primary osteoarthritis, right knee: Secondary | ICD-10-CM | POA: Diagnosis not present

## 2023-06-25 DIAGNOSIS — R6889 Other general symptoms and signs: Secondary | ICD-10-CM | POA: Diagnosis not present

## 2023-07-04 DIAGNOSIS — R6889 Other general symptoms and signs: Secondary | ICD-10-CM | POA: Diagnosis not present

## 2023-07-04 DIAGNOSIS — M25561 Pain in right knee: Secondary | ICD-10-CM | POA: Diagnosis not present

## 2023-07-04 DIAGNOSIS — M1711 Unilateral primary osteoarthritis, right knee: Secondary | ICD-10-CM | POA: Diagnosis not present

## 2023-07-06 ENCOUNTER — Other Ambulatory Visit: Payer: Self-pay | Admitting: Family Medicine

## 2023-07-09 ENCOUNTER — Other Ambulatory Visit: Payer: Self-pay | Admitting: Family Medicine

## 2023-07-09 DIAGNOSIS — I10 Essential (primary) hypertension: Secondary | ICD-10-CM

## 2023-07-11 DIAGNOSIS — M1711 Unilateral primary osteoarthritis, right knee: Secondary | ICD-10-CM | POA: Diagnosis not present

## 2023-07-11 DIAGNOSIS — R6889 Other general symptoms and signs: Secondary | ICD-10-CM | POA: Diagnosis not present

## 2023-07-11 DIAGNOSIS — M25561 Pain in right knee: Secondary | ICD-10-CM | POA: Diagnosis not present

## 2023-07-18 DIAGNOSIS — M1711 Unilateral primary osteoarthritis, right knee: Secondary | ICD-10-CM | POA: Diagnosis not present

## 2023-07-18 DIAGNOSIS — R6889 Other general symptoms and signs: Secondary | ICD-10-CM | POA: Diagnosis not present

## 2023-07-24 DIAGNOSIS — R6889 Other general symptoms and signs: Secondary | ICD-10-CM | POA: Diagnosis not present

## 2023-07-24 DIAGNOSIS — M1711 Unilateral primary osteoarthritis, right knee: Secondary | ICD-10-CM | POA: Diagnosis not present

## 2023-07-30 DIAGNOSIS — M25561 Pain in right knee: Secondary | ICD-10-CM | POA: Diagnosis not present

## 2023-08-13 DIAGNOSIS — D472 Monoclonal gammopathy: Secondary | ICD-10-CM | POA: Diagnosis not present

## 2023-08-20 ENCOUNTER — Other Ambulatory Visit: Payer: Self-pay | Admitting: *Deleted

## 2023-08-20 DIAGNOSIS — I6523 Occlusion and stenosis of bilateral carotid arteries: Secondary | ICD-10-CM

## 2023-09-02 ENCOUNTER — Other Ambulatory Visit: Payer: Self-pay | Admitting: Family Medicine

## 2023-09-17 ENCOUNTER — Ambulatory Visit (HOSPITAL_COMMUNITY)
Admission: RE | Admit: 2023-09-17 | Discharge: 2023-09-17 | Disposition: A | Discharge status: Home / Self Care | Payer: PPO | Source: Ambulatory Visit | Attending: Cardiology | Admitting: Cardiology

## 2023-09-17 DIAGNOSIS — I6523 Occlusion and stenosis of bilateral carotid arteries: Secondary | ICD-10-CM | POA: Diagnosis not present

## 2023-09-19 DIAGNOSIS — Z6839 Body mass index (BMI) 39.0-39.9, adult: Secondary | ICD-10-CM | POA: Diagnosis not present

## 2023-09-19 DIAGNOSIS — G4733 Obstructive sleep apnea (adult) (pediatric): Secondary | ICD-10-CM | POA: Diagnosis not present

## 2023-10-01 ENCOUNTER — Encounter: Payer: Self-pay | Admitting: *Deleted

## 2023-10-01 ENCOUNTER — Other Ambulatory Visit: Payer: Self-pay | Admitting: *Deleted

## 2023-10-01 DIAGNOSIS — I6523 Occlusion and stenosis of bilateral carotid arteries: Secondary | ICD-10-CM

## 2023-10-02 ENCOUNTER — Other Ambulatory Visit: Payer: Self-pay | Admitting: *Deleted

## 2023-10-08 ENCOUNTER — Other Ambulatory Visit: Payer: Self-pay | Admitting: Family Medicine

## 2023-10-08 DIAGNOSIS — I10 Essential (primary) hypertension: Secondary | ICD-10-CM

## 2023-10-09 DIAGNOSIS — R296 Repeated falls: Secondary | ICD-10-CM | POA: Diagnosis not present

## 2023-10-09 DIAGNOSIS — D472 Monoclonal gammopathy: Secondary | ICD-10-CM | POA: Diagnosis not present

## 2023-10-09 DIAGNOSIS — G3184 Mild cognitive impairment, so stated: Secondary | ICD-10-CM | POA: Diagnosis not present

## 2023-10-09 DIAGNOSIS — G608 Other hereditary and idiopathic neuropathies: Secondary | ICD-10-CM | POA: Diagnosis not present

## 2023-10-09 DIAGNOSIS — R55 Syncope and collapse: Secondary | ICD-10-CM | POA: Diagnosis not present

## 2023-10-15 DIAGNOSIS — R55 Syncope and collapse: Secondary | ICD-10-CM | POA: Diagnosis not present

## 2023-10-17 DIAGNOSIS — R55 Syncope and collapse: Secondary | ICD-10-CM | POA: Diagnosis not present

## 2023-10-17 DIAGNOSIS — G4733 Obstructive sleep apnea (adult) (pediatric): Secondary | ICD-10-CM | POA: Diagnosis not present

## 2023-11-01 ENCOUNTER — Other Ambulatory Visit: Payer: Self-pay | Admitting: Family Medicine

## 2023-11-01 DIAGNOSIS — G8929 Other chronic pain: Secondary | ICD-10-CM

## 2023-11-01 MED ORDER — HYDROCODONE-ACETAMINOPHEN 5-325 MG PO TABS: 1.0000 | ORAL_TABLET | Freq: Four times a day (QID) | ORAL | 0 refills | Status: DC | PRN

## 2023-11-01 NOTE — Telephone Encounter (Signed)
 Copied from CRM [id number]. Topic: Clinical - Medication Refill >> Nov 01, 2023 10:21 AM Elizebeth Brooking wrote: Most Recent Primary Care Visit:  Provider: Sheliah Hatch  Department: [facility]  Visit Type: OFFICE VISIT  Date: 03/07/2023  Medication: HYDROcodone-acetaminophen (NORCO/VICODIN) 5-325 MG tablet   Has the patient contacted their pharmacy? Yes (Agent: If no, request that the patient contact the pharmacy for the refill. If patient does not wish to contact the pharmacy document the reason why and proceed with request.) (Agent: If yes, when and what did the pharmacy advise?)  Is this the correct pharmacy for this prescription? Yes If no, delete pharmacy and type the correct one.  This is the patient's preferred pharmacy:  [facility] STORE #[id number], [place] - [address] AT [facility] [address] [facility] [address] Phone: [phone] Fax: [phone]   Has the prescription been filled recently? No  Is the patient out of the medication? Yes  Has the patient been seen for an appointment in the last year OR does the patient have an upcoming appointment? Yes  Can we respond through MyChart? Yes  Agent: Please be advised that Rx refills may take up to 3 business days. We ask that you follow-up with your pharmacy.

## 2023-11-01 NOTE — Telephone Encounter (Signed)
 Requested Prescriptions   Pending Prescriptions Disp Refills   HYDROcodone-acetaminophen (NORCO/VICODIN) 5-325 MG tablet 60 tablet 0    Sig: Take 1 tablet by mouth every 6 (six) hours as needed.     Date of patient request: 11/01/2023 Last office visit: 03/07/2023 Upcoming visit: Visit date not found Date of last refill: 03/07/2023 Last refill amount: 60

## 2023-11-06 DIAGNOSIS — G4733 Obstructive sleep apnea (adult) (pediatric): Secondary | ICD-10-CM | POA: Diagnosis not present

## 2023-11-06 DIAGNOSIS — R55 Syncope and collapse: Secondary | ICD-10-CM | POA: Diagnosis not present

## 2023-11-12 DIAGNOSIS — I491 Atrial premature depolarization: Secondary | ICD-10-CM | POA: Diagnosis not present

## 2023-11-17 DIAGNOSIS — G4733 Obstructive sleep apnea (adult) (pediatric): Secondary | ICD-10-CM | POA: Diagnosis not present

## 2023-11-19 DIAGNOSIS — D472 Monoclonal gammopathy: Secondary | ICD-10-CM | POA: Diagnosis not present

## 2023-11-21 ENCOUNTER — Other Ambulatory Visit: Payer: Self-pay

## 2023-11-21 MED ORDER — BUPROPION HCL 75 MG PO TABS: 75.0000 mg | ORAL_TABLET | Freq: Two times a day (BID) | ORAL | 3 refills | Status: DC

## 2023-12-02 ENCOUNTER — Other Ambulatory Visit: Payer: Self-pay | Admitting: Family Medicine

## 2023-12-02 DIAGNOSIS — E038 Other specified hypothyroidism: Secondary | ICD-10-CM

## 2023-12-17 DIAGNOSIS — G4733 Obstructive sleep apnea (adult) (pediatric): Secondary | ICD-10-CM | POA: Diagnosis not present

## 2024-01-05 ENCOUNTER — Other Ambulatory Visit: Payer: Self-pay | Admitting: Family Medicine

## 2024-01-05 DIAGNOSIS — I10 Essential (primary) hypertension: Secondary | ICD-10-CM

## 2024-01-11 ENCOUNTER — Ambulatory Visit (INDEPENDENT_AMBULATORY_CARE_PROVIDER_SITE_OTHER): Admitting: Family Medicine

## 2024-01-11 ENCOUNTER — Encounter: Payer: Self-pay | Admitting: Family Medicine

## 2024-01-11 VITALS — BP 128/74 | HR 75 | Temp 98.5°F | Ht 65.0 in | Wt 240.0 lb

## 2024-01-11 DIAGNOSIS — E038 Other specified hypothyroidism: Secondary | ICD-10-CM | POA: Diagnosis not present

## 2024-01-11 DIAGNOSIS — I10 Essential (primary) hypertension: Secondary | ICD-10-CM

## 2024-01-11 DIAGNOSIS — E785 Hyperlipidemia, unspecified: Secondary | ICD-10-CM

## 2024-01-11 DIAGNOSIS — Z23 Encounter for immunization: Secondary | ICD-10-CM

## 2024-01-11 DIAGNOSIS — E7800 Pure hypercholesterolemia, unspecified: Secondary | ICD-10-CM

## 2024-01-11 MED ORDER — BUPROPION HCL 75 MG PO TABS: 75.0000 mg | ORAL_TABLET | Freq: Two times a day (BID) | ORAL | 1 refills | Status: AC

## 2024-01-11 MED ORDER — FUROSEMIDE 20 MG PO TABS: 40.0000 mg | ORAL_TABLET | Freq: Every day | ORAL | 1 refills | Status: AC

## 2024-01-11 MED ORDER — CITALOPRAM HYDROBROMIDE 40 MG PO TABS: ORAL_TABLET | ORAL | 1 refills | Status: AC

## 2024-01-11 MED ORDER — SIMVASTATIN 40 MG PO TABS
ORAL_TABLET | ORAL | 1 refills | Status: AC
Start: 2024-01-11 — End: ?

## 2024-01-11 MED ORDER — AMLODIPINE BESYLATE 2.5 MG PO TABS: 2.5000 mg | ORAL_TABLET | Freq: Every day | ORAL | 1 refills | Status: DC

## 2024-01-11 NOTE — Progress Notes (Unsigned)
   Subjective:    Patient ID: Ashley Woods, female    DOB: February 21, 1946, 78 y.o.   MRN: [id number]  HPI HTN- chronic problem, on Amlodipine  2.5mg  daily, Lasix  40mg  daily, Lisinopril  40mg  daily w/ good control.  No CP, SOB above baseline, edema above baseline.  Hyperlipidemia- chronic problem.  On Simvastatin  40mg  daily.  Denies abd pain, N/V.  Hypothyroid chronic problem, on levothyroxine  100mcg daily.  Energy level is stable.  Denies changes to skin/hair/nails.  Obesity- pt is down 5 lbs since last visit.   Review of Systems For ROS see HPI     Objective:   Physical Exam Vitals reviewed.  Constitutional:      General: She is not in acute distress.    Appearance: Normal appearance. She is well-developed. She is obese. She is not ill-appearing.  HENT:     Head: Normocephalic and atraumatic.   Eyes:     Conjunctiva/sclera: Conjunctivae normal.     Pupils: Pupils are equal, round, and reactive to light.   Neck:     Thyroid : No thyromegaly.   Cardiovascular:     Rate and Rhythm: Normal rate and regular rhythm.     Pulses: Normal pulses.     Heart sounds: Normal heart sounds. No murmur heard. Pulmonary:     Effort: Pulmonary effort is normal. No respiratory distress.     Breath sounds: Normal breath sounds.  Abdominal:     General: There is no distension.     Palpations: Abdomen is soft.     Tenderness: There is no abdominal tenderness.   Musculoskeletal:     Cervical back: Normal range of motion and neck supple.     Right lower leg: Edema (trace) present.     Left lower leg: Edema (trace) present.  Lymphadenopathy:     Cervical: No cervical adenopathy.   Skin:    General: Skin is warm and dry.   Neurological:     General: No focal deficit present.     Mental Status: She is alert and oriented to person, place, and time.   Psychiatric:        Behavior: Behavior normal.           Assessment & Plan:

## 2024-01-11 NOTE — Patient Instructions (Signed)
 Schedule your complete physical in 6 months We'll notify you of your lab results and make any changes if needed Continue to work on healthy diet and regular exercise- you can do it! Call with any questions or concerns Stay Safe!  Stay Healthy! Have a great summer!!! 

## 2024-01-12 LAB — BASIC METABOLIC PANEL WITH GFR
BUN: 18 mg/dL (ref 7–25)
CO2: 30 mmol/L (ref 20–32)
Calcium: 9.4 mg/dL (ref 8.6–10.4)
Chloride: 105 mmol/L (ref 98–110)
Creat: 0.85 mg/dL (ref 0.60–1.00)
Glucose, Bld: 97 mg/dL (ref 65–99)
Potassium: 4.2 mmol/L (ref 3.5–5.3)
Sodium: 143 mmol/L (ref 135–146)
eGFR: 71 mL/min/{1.73_m2} (ref >=60)

## 2024-01-12 LAB — HEPATIC FUNCTION PANEL
AG Ratio: 2.4 (calc) (ref 1.0–2.5)
ALT: 17 U/L (ref 6–29)
AST: 20 U/L (ref 10–35)
Albumin: 4.5 g/dL (ref 3.6–5.1)
Alkaline phosphatase (APISO): 63 U/L (ref 37–153)
Bilirubin, Direct: 0.1 mg/dL (ref 0.0–0.2)
Globulin: 1.9 g/dL (ref 1.9–3.7)
Indirect Bilirubin: 0.7 mg/dL (ref 0.2–1.2)
Total Bilirubin: 0.8 mg/dL (ref 0.2–1.2)
Total Protein: 6.4 g/dL (ref 6.1–8.1)

## 2024-01-12 LAB — CBC WITH DIFFERENTIAL/PLATELET
Absolute Lymphocytes: 1224 {cells}/uL (ref 850–3900)
Absolute Monocytes: 360 {cells}/uL (ref 200–950)
Basophils Absolute: 58 {cells}/uL (ref 0–200)
Basophils Relative: 1.2 %
Eosinophils Absolute: 91 {cells}/uL (ref 15–500)
Eosinophils Relative: 1.9 %
HCT: 44.1 % (ref 35.0–45.0)
Hemoglobin: 14.5 g/dL (ref 11.7–15.5)
MCH: 32.3 pg (ref 27.0–33.0)
MCHC: 32.9 g/dL (ref 32.0–36.0)
MCV: 98.2 fL (ref 80.0–100.0)
MPV: 12.2 fL (ref 7.5–12.5)
Monocytes Relative: 7.5 %
Neutro Abs: 3067 {cells}/uL (ref 1500–7800)
Neutrophils Relative %: 63.9 %
Platelets: 153 10*3/uL (ref 140–400)
RBC: 4.49 10*6/uL (ref 3.80–5.10)
RDW: 14.3 % (ref 11.0–15.0)
Total Lymphocyte: 25.5 %
WBC: 4.8 10*3/uL (ref 3.8–10.8)

## 2024-01-12 LAB — LIPID PANEL
Cholesterol: 181 mg/dL (ref <200)
HDL: 62 mg/dL (ref >=50)
LDL Cholesterol (Calc): 94 mg/dL
Non-HDL Cholesterol (Calc): 119 mg/dL (ref <130)
Total CHOL/HDL Ratio: 2.9 (calc) (ref <5.0)
Triglycerides: 153 mg/dL — ABNORMAL HIGH (ref <150)

## 2024-01-12 LAB — HEMOGLOBIN A1C
Hgb A1c MFr Bld: 5.5 % (ref <5.7)
Mean Plasma Glucose: 111 mg/dL
eAG (mmol/L): 6.2 mmol/L

## 2024-01-12 LAB — TSH: TSH: 1.93 m[IU]/L (ref 0.40–4.50)

## 2024-01-12 NOTE — Assessment & Plan Note (Addendum)
 Chronic problem.  Reports energy level is stable.  No changes to skin/hair/nails.  Check labs.  Adjust meds prn

## 2024-01-12 NOTE — Assessment & Plan Note (Signed)
 Improving.  Pt is down 5 lbs since last visit.  Applauded her efforts.  Will follow.

## 2024-01-12 NOTE — Assessment & Plan Note (Signed)
Chronic problem.  On Simvastatin daily w/o difficulty.  Check labs.  Adjust meds prn

## 2024-01-12 NOTE — Assessment & Plan Note (Signed)
 Chronic problem.  On Amlodipine , Lasix , and Lisinopril  w/ good control.  Currently asymptomatic.  Check labs due to ACE and diuretic use but no anticipated med changes.  Will follow.

## 2024-01-14 ENCOUNTER — Ambulatory Visit: Payer: Self-pay | Admitting: Family Medicine

## 2024-01-14 NOTE — Progress Notes (Signed)
 Pt has been notified.

## 2024-01-23 ENCOUNTER — Telehealth: Payer: Self-pay

## 2024-01-23 NOTE — Telephone Encounter (Signed)
 Type of form received: [facility]   Additional comments: request for additional Dx codes   Received by: Jacquline Moats should be Faxed/mailed to: [phone]  Is patient requesting call for pickup: No -fax back   Form placed:   Dr Donaciano sign folder   Attach charge sheet.  Provider will determine charge.  Individual made aware of 3-5 business day turn around Yes?

## 2024-01-30 NOTE — Telephone Encounter (Signed)
 No additional codes to submit at this time

## 2024-02-05 ENCOUNTER — Other Ambulatory Visit: Payer: Self-pay

## 2024-02-05 ENCOUNTER — Telehealth: Payer: Self-pay

## 2024-02-05 NOTE — Telephone Encounter (Signed)
 LVM

## 2024-02-18 DIAGNOSIS — D472 Monoclonal gammopathy: Secondary | ICD-10-CM | POA: Diagnosis not present

## 2024-03-02 ENCOUNTER — Other Ambulatory Visit: Payer: Self-pay | Admitting: Family Medicine

## 2024-04-02 ENCOUNTER — Telehealth: Payer: Self-pay

## 2024-04-02 DIAGNOSIS — G8929 Other chronic pain: Secondary | ICD-10-CM

## 2024-04-02 MED ORDER — HYDROCODONE-ACETAMINOPHEN 5-325 MG PO TABS: 1.0000 | ORAL_TABLET | Freq: Four times a day (QID) | ORAL | 0 refills | Status: AC | PRN

## 2024-04-02 NOTE — Telephone Encounter (Signed)
 Requested Prescriptions   Pending Prescriptions Disp Refills   HYDROcodone -acetaminophen  (NORCO/VICODIN) 5-325 MG tablet 60 tablet 0    Sig: Take 1 tablet by mouth every 6 (six) hours as needed.     Date of patient request: 04/02/24 Last office visit: 01/11/24 Upcoming visit: 07/16/24 Date of last refill: 11/01/23 Last refill amount: 15 day supply

## 2024-04-05 ENCOUNTER — Other Ambulatory Visit: Payer: Self-pay | Admitting: Family Medicine

## 2024-04-05 DIAGNOSIS — E785 Hyperlipidemia, unspecified: Secondary | ICD-10-CM

## 2024-04-05 DIAGNOSIS — I10 Essential (primary) hypertension: Secondary | ICD-10-CM

## 2024-04-18 DIAGNOSIS — G4733 Obstructive sleep apnea (adult) (pediatric): Secondary | ICD-10-CM | POA: Diagnosis not present

## 2024-04-30 DIAGNOSIS — R55 Syncope and collapse: Secondary | ICD-10-CM | POA: Diagnosis not present

## 2024-04-30 DIAGNOSIS — G43909 Migraine, unspecified, not intractable, without status migrainosus: Secondary | ICD-10-CM | POA: Diagnosis not present

## 2024-04-30 DIAGNOSIS — D472 Monoclonal gammopathy: Secondary | ICD-10-CM | POA: Diagnosis not present

## 2024-04-30 DIAGNOSIS — M48062 Spinal stenosis, lumbar region with neurogenic claudication: Secondary | ICD-10-CM | POA: Diagnosis not present

## 2024-04-30 DIAGNOSIS — G608 Other hereditary and idiopathic neuropathies: Secondary | ICD-10-CM | POA: Diagnosis not present

## 2024-04-30 DIAGNOSIS — Z79899 Other long term (current) drug therapy: Secondary | ICD-10-CM | POA: Diagnosis not present

## 2024-05-05 DIAGNOSIS — M25561 Pain in right knee: Secondary | ICD-10-CM | POA: Diagnosis not present

## 2024-05-05 DIAGNOSIS — M1711 Unilateral primary osteoarthritis, right knee: Secondary | ICD-10-CM | POA: Diagnosis not present

## 2024-05-05 DIAGNOSIS — G8918 Other acute postprocedural pain: Secondary | ICD-10-CM | POA: Diagnosis not present

## 2024-05-07 DIAGNOSIS — G4733 Obstructive sleep apnea (adult) (pediatric): Secondary | ICD-10-CM | POA: Diagnosis not present

## 2024-05-30 ENCOUNTER — Other Ambulatory Visit: Payer: Self-pay | Admitting: Family Medicine

## 2024-05-30 DIAGNOSIS — E038 Other specified hypothyroidism: Secondary | ICD-10-CM

## 2024-06-18 ENCOUNTER — Other Ambulatory Visit: Payer: Self-pay

## 2024-06-18 DIAGNOSIS — G4733 Obstructive sleep apnea (adult) (pediatric): Secondary | ICD-10-CM | POA: Diagnosis not present

## 2024-06-18 DIAGNOSIS — Z122 Encounter for screening for malignant neoplasm of respiratory organs: Secondary | ICD-10-CM

## 2024-07-09 ENCOUNTER — Other Ambulatory Visit: Payer: Self-pay | Admitting: Family Medicine

## 2024-07-09 DIAGNOSIS — I10 Essential (primary) hypertension: Secondary | ICD-10-CM

## 2024-07-15 ENCOUNTER — Encounter (HOSPITAL_COMMUNITY): Payer: Self-pay

## 2024-07-15 ENCOUNTER — Telehealth: Payer: Self-pay | Admitting: Family Medicine

## 2024-07-15 NOTE — Telephone Encounter (Signed)
 Copied from CRM #[id number]. Topic: Referral - Status >> Jul 15, 2024  2:07 PM Suzen RAMAN wrote: Reason for CRM: referral: [id number] patient would like referral redirected to the [facility] at [facility] advised patient that they no longer do this at the office due to the person quitting. Patient also needs a prior authorization completed and would like to have a mammogram done as well at this location.    CB#[phone]

## 2024-07-16 ENCOUNTER — Encounter: Admitting: Family Medicine

## 2024-07-16 NOTE — Telephone Encounter (Signed)
 Patient is requesting order for mammo  She would like lung cancer screening ordered to [facility] at [facility] along with leverne [place] to order?

## 2024-07-16 NOTE — Telephone Encounter (Signed)
 The wording of this message is very confusing- does she want the imaging done at [facility] or [facility]?  These are 2 separate locations

## 2024-07-17 NOTE — Telephone Encounter (Signed)
 Please also see phone note about a request for her Daughter in her daughters chart.

## 2024-07-17 NOTE — Telephone Encounter (Signed)
 Called and LM to call back.  Need to find out if she would like to go to [facility] (Address: [address] [place], [place] [address] Phone: [phone]) OR [facility] (Address: [address], [place], [place] [address]  Phone: [phone])  From what I can tell patient was advised by [facility] location that this is not currently an option at their location due to staffing issue so the referral should be sent to [facility].   Patient also states she needs a Prior authorization but is that for the mammogram or for the lung cancer screening?

## 2024-07-17 NOTE — Telephone Encounter (Signed)
 Called patient and left a voicemail. CT lung cancer screening does not require authorization from her insurance, she can have it at any [facility] of her choice.  Also, I do not see an order for a mammogram. The provider can place an order for mammogram and specify [facility] on the order.

## 2024-07-18 NOTE — Telephone Encounter (Signed)
 Dr. ONEIDA- do you normally order the CT lung CA screen or do you sent to lung cancer screening program? Pt also requesting orders for mammogram at [facility].

## 2024-07-18 NOTE — Addendum Note (Signed)
 Addended by: Dalina Samara E on: 07/18/2024 08:33 AM   Modules accepted: Orders

## 2024-07-18 NOTE — Telephone Encounter (Signed)
 Called to let her know orders have been placed as well as let her know orders were placed for her daughter as well (see other phone note for details)

## 2024-07-18 NOTE — Telephone Encounter (Signed)
 Order entered for mammo at [facility]  Referral placed for Lung Cancer Screening

## 2024-07-25 ENCOUNTER — Ambulatory Visit: Admitting: Family Medicine

## 2024-07-25 ENCOUNTER — Encounter: Payer: Self-pay | Admitting: Family Medicine

## 2024-07-25 VITALS — BP 138/66 | HR 85 | Temp 98.8°F | Resp 14 | Ht 65.0 in | Wt 251.6 lb

## 2024-07-25 DIAGNOSIS — I10 Essential (primary) hypertension: Secondary | ICD-10-CM | POA: Diagnosis not present

## 2024-07-25 DIAGNOSIS — Z0000 Encounter for general adult medical examination without abnormal findings: Secondary | ICD-10-CM | POA: Diagnosis not present

## 2024-07-25 DIAGNOSIS — E559 Vitamin D deficiency, unspecified: Secondary | ICD-10-CM | POA: Diagnosis not present

## 2024-07-25 LAB — LIPID PANEL
Cholesterol: 144 mg/dL (ref 28–200)
HDL: 56.5 mg/dL
LDL Cholesterol: 61 mg/dL (ref 10–99)
NonHDL: 87.44
Total CHOL/HDL Ratio: 3
Triglycerides: 131 mg/dL (ref 10.0–149.0)
VLDL: 26.2 mg/dL (ref 0.0–40.0)

## 2024-07-25 LAB — CBC WITH DIFFERENTIAL/PLATELET
Basophils Absolute: 0 K/uL (ref 0.0–0.1)
Basophils Relative: 0.9 % (ref 0.0–3.0)
Eosinophils Absolute: 0.1 K/uL (ref 0.0–0.7)
Eosinophils Relative: 2.4 % (ref 0.0–5.0)
HCT: 40.9 % (ref 36.0–46.0)
Hemoglobin: 13.7 g/dL (ref 12.0–15.0)
Lymphocytes Relative: 20.2 % (ref 12.0–46.0)
Lymphs Abs: 1 K/uL (ref 0.7–4.0)
MCHC: 33.6 g/dL (ref 30.0–36.0)
MCV: 96.7 fl (ref 78.0–100.0)
Monocytes Absolute: 0.4 K/uL (ref 0.1–1.0)
Monocytes Relative: 7.5 % (ref 3.0–12.0)
Neutro Abs: 3.4 K/uL (ref 1.4–7.7)
Neutrophils Relative %: 69 % (ref 43.0–77.0)
Platelets: 139 K/uL — ABNORMAL LOW (ref 150.0–400.0)
RBC: 4.23 Mil/uL (ref 3.87–5.11)
RDW: 14 % (ref 11.5–15.5)
WBC: 5 K/uL (ref 4.0–10.5)

## 2024-07-25 LAB — HEPATIC FUNCTION PANEL
ALT: 15 U/L (ref 3–35)
AST: 17 U/L (ref 5–37)
Albumin: 4.3 g/dL (ref 3.5–5.2)
Alkaline Phosphatase: 51 U/L (ref 39–117)
Bilirubin, Direct: 0.1 mg/dL (ref 0.1–0.3)
Total Bilirubin: 0.5 mg/dL (ref 0.2–1.2)
Total Protein: 6.3 g/dL (ref 6.0–8.3)

## 2024-07-25 LAB — TSH: TSH: 1.38 u[IU]/mL (ref 0.35–5.50)

## 2024-07-25 LAB — BASIC METABOLIC PANEL WITH GFR
BUN: 17 mg/dL (ref 6–23)
CO2: 28 meq/L (ref 19–32)
Calcium: 9.1 mg/dL (ref 8.4–10.5)
Chloride: 108 meq/L (ref 96–112)
Creatinine, Ser: 0.92 mg/dL (ref 0.40–1.20)
GFR: 59.74 mL/min — ABNORMAL LOW
Glucose, Bld: 114 mg/dL — ABNORMAL HIGH (ref 70–99)
Potassium: 4 meq/L (ref 3.5–5.1)
Sodium: 143 meq/L (ref 135–145)

## 2024-07-25 LAB — VITAMIN D 25 HYDROXY (VIT D DEFICIENCY, FRACTURES): VITD: 20.35 ng/mL — ABNORMAL LOW (ref 30.00–100.00)

## 2024-07-25 NOTE — Patient Instructions (Signed)
Follow up in 6 months to recheck BP and cholesterol We'll notify you of your lab results and make any changes if needed Continue to work on healthy diet and regular exercise- you can do it! Call with any questions or concerns Stay Safe!  Stay Healthy! Happy New Year!!!

## 2024-07-25 NOTE — Assessment & Plan Note (Signed)
 Pt's PE WNL w/ exception of BMI.  UTD on colonoscopy, flu, PNA.  Due for mammo- ordered.  Check labs.  Anticipatory guidance provided.

## 2024-07-25 NOTE — Progress Notes (Signed)
" ° °  Subjective:    Patient ID: Ashley Woods, female    DOB: Apr 23, 1946, 78 y.o.   MRN: [id number]  HPI CPE- UTD on colonoscopy, flu, PNA.  Due for mammo- already ordered.  Aged out of lung cancer screening  Health Maintenance  Topic Date Due   Lung Cancer Screening  Never done   Mammogram  09/21/2022   Medicare Annual Wellness (AWV)  07/13/2023   Influenza Vaccine  02/29/2024   Zoster Vaccines- Shingrix (1 of 2) 10/23/2024 (Originally 04[birth year]18/1965)   Colonoscopy  04/01/2025   Pneumococcal Vaccine: 50+ Years  Completed   Bone Density Scan  Completed   Hepatitis C Screening  Completed   Meningococcal B Vaccine  Aged Out   DTaP/Tdap/Td  Discontinued   COVID-19 Vaccine  Discontinued    Patient Care Team    Relationship Specialty Notifications Start End  Mahlon Comer BRAVO, MD PCP - General   07/13/10    Comment: Merged (Merged)  Abran Norleen SAILOR, MD Consulting Physician Gastroenterology  04/28/15   Rudy Greig GAILS, OD Consulting Physician Optometry  04/28/15   Katharina Pellet  Obstetrics and Gynecology  10/12/16   Donzetta, Prentice Pereyra, MD Referring Physician Pulmonary Disease  04/24/18       Review of Systems Patient reports no vision/ hearing changes, adenopathy,fever, persistant/recurrent hoarseness , swallowing issues, chest pain, palpitations, edema, persistant/recurrent cough, hemoptysis, dyspnea (rest/exertional/paroxysmal nocturnal), gastrointestinal bleeding (melena, rectal bleeding), abdominal pain, significant heartburn, bowel changes, GU symptoms (dysuria, hematuria, incontinence), Gyn symptoms (abnormal  bleeding, pain),  syncope, focal weakness, memory loss, numbness & tingling, skin/hair/nail changes, abnormal bruising or bleeding, anxiety, or depression.   + 11 lb weight gain    Objective:   Physical Exam General Appearance:    Alert, cooperative, no distress, appears stated age, obese  Head:    Normocephalic, without obvious abnormality, atraumatic  Eyes:     PERRL, conjunctiva/corneas clear, EOM's intact both eyes  Ears:    Normal TM's and external ear canals, both ears  Nose:   Nares normal, septum midline, mucosa normal, no drainage    or sinus tenderness  Throat:   Lips, mucosa, and tongue normal; teeth and gums normal  Neck:   Supple, symmetrical, trachea midline, no adenopathy;    Thyroid : no enlargement/tenderness/nodules  Back:     Symmetric, no curvature, ROM normal, no CVA tenderness  Lungs:     Clear to auscultation bilaterally, respirations unlabored  Chest Wall:    No tenderness or deformity   Heart:    Regular rate and rhythm, S1 and S2 normal, no murmur, rub   or gallop  Breast Exam:    Deferred to mammo  Abdomen:     Soft, non-tender, bowel sounds active all four quadrants,    no masses, no organomegaly  Genitalia:    Deferred  Rectal:    Extremities:   Extremities normal, atraumatic, no cyanosis or edema  Pulses:   2+ and symmetric all extremities  Skin:   Skin color, texture, turgor normal, no rashes or lesions  Lymph nodes:   Cervical, supraclavicular, and axillary nodes normal  Neurologic:   CNII-XII intact, normal strength, sensation and reflexes    throughout          Assessment & Plan:    "

## 2024-07-28 ENCOUNTER — Ambulatory Visit: Payer: Self-pay | Admitting: Family Medicine

## 2024-07-28 MED ORDER — VITAMIN D (ERGOCALCIFEROL) 1.25 MG (50000 UNIT) PO CAPS: 50000.0000 [IU] | ORAL_CAPSULE | ORAL | 0 refills | Status: AC

## 2024-07-28 NOTE — Progress Notes (Signed)
 Lab results have been discussed.   Verbalized understanding? Yes  Are there any questions? No   Sent rx for vitamin d  to pharmacy

## 2024-09-01 ENCOUNTER — Inpatient Hospital Stay (HOSPITAL_BASED_OUTPATIENT_CLINIC_OR_DEPARTMENT_OTHER): Admission: RE | Admit: 2024-09-01 | Source: Ambulatory Visit

## 2024-09-02 ENCOUNTER — Other Ambulatory Visit: Payer: Self-pay

## 2024-09-02 MED ORDER — AMLODIPINE BESYLATE 2.5 MG PO TABS: 2.5000 mg | ORAL_TABLET | Freq: Every day | ORAL | 1 refills | Status: AC

## 2024-09-03 ENCOUNTER — Ambulatory Visit (HOSPITAL_COMMUNITY)

## 2024-09-05 ENCOUNTER — Ambulatory Visit: Payer: Self-pay | Admitting: Cardiology

## 2024-09-05 ENCOUNTER — Ambulatory Visit (HOSPITAL_COMMUNITY): Admission: RE | Admit: 2024-09-05

## 2024-09-05 DIAGNOSIS — I6523 Occlusion and stenosis of bilateral carotid arteries: Secondary | ICD-10-CM

## 2024-09-15 ENCOUNTER — Ambulatory Visit (HOSPITAL_BASED_OUTPATIENT_CLINIC_OR_DEPARTMENT_OTHER)

## 2025-01-23 ENCOUNTER — Ambulatory Visit: Admitting: Family Medicine
